# Patient Record
Sex: Female | Born: 1935 | Race: White | Hispanic: No | Smoking: Former smoker
Health system: Southern US, Community
[De-identification: ages and names within clinical notes are randomized; demographics above are authoritative.]

## PROBLEM LIST (undated history)

## (undated) DIAGNOSIS — I472 Ventricular tachycardia, unspecified: Secondary | ICD-10-CM

## (undated) DIAGNOSIS — I447 Left bundle-branch block, unspecified: Secondary | ICD-10-CM

## (undated) DIAGNOSIS — Z90710 Acquired absence of both cervix and uterus: Secondary | ICD-10-CM

## (undated) DIAGNOSIS — R0902 Hypoxemia: Secondary | ICD-10-CM

## (undated) DIAGNOSIS — Z8542 Personal history of malignant neoplasm of other parts of uterus: Secondary | ICD-10-CM

## (undated) DIAGNOSIS — IMO0001 Reserved for inherently not codable concepts without codable children: Secondary | ICD-10-CM

## (undated) DIAGNOSIS — I219 Acute myocardial infarction, unspecified: Secondary | ICD-10-CM

## (undated) DIAGNOSIS — M1990 Unspecified osteoarthritis, unspecified site: Secondary | ICD-10-CM

## (undated) DIAGNOSIS — I10 Essential (primary) hypertension: Secondary | ICD-10-CM

## (undated) DIAGNOSIS — I2510 Atherosclerotic heart disease of native coronary artery without angina pectoris: Secondary | ICD-10-CM

## (undated) DIAGNOSIS — R011 Cardiac murmur, unspecified: Secondary | ICD-10-CM

## (undated) DIAGNOSIS — Z95810 Presence of automatic (implantable) cardiac defibrillator: Secondary | ICD-10-CM

## (undated) DIAGNOSIS — E785 Hyperlipidemia, unspecified: Secondary | ICD-10-CM

## (undated) DIAGNOSIS — I509 Heart failure, unspecified: Secondary | ICD-10-CM

## (undated) DIAGNOSIS — I4891 Unspecified atrial fibrillation: Secondary | ICD-10-CM

## (undated) HISTORY — PX: CORONARY ANGIOPLASTY: SHX604

## (undated) HISTORY — PX: INSERT / REPLACE / REMOVE PACEMAKER: SUR710

## (undated) HISTORY — DX: Ventricular tachycardia: I47.2

## (undated) HISTORY — DX: Left bundle-branch block, unspecified: I44.7

## (undated) HISTORY — PX: CATARACT EXTRACTION: SUR2

## (undated) HISTORY — DX: Hyperlipidemia, unspecified: E78.5

## (undated) HISTORY — DX: Personal history of malignant neoplasm of other parts of uterus: Z85.42

## (undated) HISTORY — PX: CHOLECYSTECTOMY: SHX55

## (undated) HISTORY — DX: Hypoxemia: R09.02

## (undated) HISTORY — DX: Unspecified osteoarthritis, unspecified site: M19.90

## (undated) HISTORY — DX: Presence of automatic (implantable) cardiac defibrillator: Z95.810

## (undated) HISTORY — DX: Acquired absence of both cervix and uterus: Z90.710

## (undated) HISTORY — PX: CARDIAC DEFIBRILLATOR PLACEMENT: SHX171

## (undated) HISTORY — DX: Ventricular tachycardia, unspecified: I47.20

## (undated) HISTORY — PX: HERNIA REPAIR: SHX51

## (undated) HISTORY — PX: CORONARY ANGIOPLASTY WITH STENT PLACEMENT: SHX49

## (undated) HISTORY — DX: Unspecified atrial fibrillation: I48.91

## (undated) HISTORY — DX: Acute myocardial infarction, unspecified: I21.9

---

## 1987-06-01 HISTORY — PX: ABDOMINAL HYSTERECTOMY: SHX81

## 1993-05-31 HISTORY — PX: CORONARY ARTERY BYPASS GRAFT: SHX141

## 1997-10-29 ENCOUNTER — Other Ambulatory Visit: Admission: RE | Admit: 1997-10-29 | Discharge: 1997-10-29 | Payer: Self-pay | Admitting: Obstetrics & Gynecology

## 1998-11-12 ENCOUNTER — Other Ambulatory Visit: Admission: RE | Admit: 1998-11-12 | Discharge: 1998-11-12 | Payer: Self-pay | Admitting: Obstetrics & Gynecology

## 1999-11-18 ENCOUNTER — Other Ambulatory Visit: Admission: RE | Admit: 1999-11-18 | Discharge: 1999-11-18 | Payer: Self-pay | Admitting: Obstetrics & Gynecology

## 2000-10-20 ENCOUNTER — Ambulatory Visit (HOSPITAL_COMMUNITY): Admission: RE | Admit: 2000-10-20 | Discharge: 2000-10-20 | Payer: Self-pay | Admitting: Ophthalmology

## 2000-12-29 ENCOUNTER — Encounter: Payer: Self-pay | Admitting: Family Medicine

## 2000-12-29 LAB — CONVERTED CEMR LAB

## 2001-02-01 ENCOUNTER — Other Ambulatory Visit: Admission: RE | Admit: 2001-02-01 | Discharge: 2001-02-01 | Payer: Self-pay | Admitting: Obstetrics & Gynecology

## 2003-04-01 ENCOUNTER — Other Ambulatory Visit: Admission: RE | Admit: 2003-04-01 | Discharge: 2003-04-01 | Payer: Self-pay | Admitting: Obstetrics & Gynecology

## 2003-09-05 ENCOUNTER — Ambulatory Visit (HOSPITAL_COMMUNITY): Admission: RE | Admit: 2003-09-05 | Discharge: 2003-09-05 | Payer: Self-pay | Admitting: Cardiology

## 2003-09-12 ENCOUNTER — Ambulatory Visit (HOSPITAL_COMMUNITY): Admission: RE | Admit: 2003-09-12 | Discharge: 2003-09-13 | Payer: Self-pay | Admitting: Cardiology

## 2003-10-11 ENCOUNTER — Observation Stay (HOSPITAL_COMMUNITY): Admission: EM | Admit: 2003-10-11 | Discharge: 2003-10-11 | Payer: Self-pay | Admitting: Emergency Medicine

## 2004-11-02 ENCOUNTER — Ambulatory Visit: Payer: Self-pay | Admitting: Family Medicine

## 2004-12-17 ENCOUNTER — Ambulatory Visit: Payer: Self-pay | Admitting: Family Medicine

## 2005-03-16 ENCOUNTER — Ambulatory Visit: Payer: Self-pay | Admitting: Family Medicine

## 2005-04-12 ENCOUNTER — Other Ambulatory Visit: Admission: RE | Admit: 2005-04-12 | Discharge: 2005-04-12 | Payer: Self-pay | Admitting: Obstetrics & Gynecology

## 2005-08-25 ENCOUNTER — Encounter: Payer: Self-pay | Admitting: Internal Medicine

## 2006-01-12 ENCOUNTER — Ambulatory Visit: Payer: Self-pay | Admitting: Family Medicine

## 2006-01-17 ENCOUNTER — Ambulatory Visit: Payer: Self-pay | Admitting: Family Medicine

## 2006-03-09 ENCOUNTER — Ambulatory Visit: Payer: Self-pay | Admitting: Family Medicine

## 2006-05-31 HISTORY — PX: DOPPLER ECHOCARDIOGRAPHY: SHX263

## 2006-08-25 ENCOUNTER — Inpatient Hospital Stay (HOSPITAL_COMMUNITY): Admission: EM | Admit: 2006-08-25 | Discharge: 2006-08-30 | Payer: Self-pay | Admitting: Emergency Medicine

## 2006-08-25 ENCOUNTER — Ambulatory Visit: Payer: Self-pay | Admitting: Internal Medicine

## 2006-08-30 ENCOUNTER — Encounter: Payer: Self-pay | Admitting: Internal Medicine

## 2006-09-15 ENCOUNTER — Ambulatory Visit: Payer: Self-pay

## 2006-09-15 ENCOUNTER — Encounter: Payer: Self-pay | Admitting: Cardiology

## 2006-12-13 ENCOUNTER — Ambulatory Visit: Payer: Self-pay | Admitting: Internal Medicine

## 2006-12-13 ENCOUNTER — Ambulatory Visit: Payer: Self-pay

## 2007-01-03 ENCOUNTER — Ambulatory Visit: Payer: Self-pay | Admitting: Family Medicine

## 2007-01-03 DIAGNOSIS — I252 Old myocardial infarction: Secondary | ICD-10-CM | POA: Insufficient documentation

## 2007-01-03 DIAGNOSIS — M1990 Unspecified osteoarthritis, unspecified site: Secondary | ICD-10-CM | POA: Insufficient documentation

## 2007-01-03 DIAGNOSIS — H409 Unspecified glaucoma: Secondary | ICD-10-CM | POA: Insufficient documentation

## 2007-01-03 DIAGNOSIS — E785 Hyperlipidemia, unspecified: Secondary | ICD-10-CM | POA: Insufficient documentation

## 2007-01-03 DIAGNOSIS — Z8542 Personal history of malignant neoplasm of other parts of uterus: Secondary | ICD-10-CM | POA: Insufficient documentation

## 2007-01-03 DIAGNOSIS — I2589 Other forms of chronic ischemic heart disease: Secondary | ICD-10-CM | POA: Insufficient documentation

## 2007-01-03 DIAGNOSIS — I4891 Unspecified atrial fibrillation: Secondary | ICD-10-CM | POA: Insufficient documentation

## 2007-01-16 ENCOUNTER — Encounter: Payer: Self-pay | Admitting: Family Medicine

## 2007-01-20 ENCOUNTER — Telehealth: Payer: Self-pay | Admitting: Family Medicine

## 2007-03-02 ENCOUNTER — Ambulatory Visit: Payer: Self-pay | Admitting: Family Medicine

## 2007-03-23 ENCOUNTER — Telehealth: Payer: Self-pay | Admitting: Family Medicine

## 2007-05-11 ENCOUNTER — Encounter: Payer: Self-pay | Admitting: Internal Medicine

## 2007-09-21 ENCOUNTER — Encounter: Payer: Self-pay | Admitting: Family Medicine

## 2008-02-09 ENCOUNTER — Ambulatory Visit: Payer: Self-pay | Admitting: Internal Medicine

## 2008-02-09 ENCOUNTER — Inpatient Hospital Stay (HOSPITAL_COMMUNITY): Admission: EM | Admit: 2008-02-09 | Discharge: 2008-02-18 | Payer: Self-pay | Admitting: Emergency Medicine

## 2008-02-15 ENCOUNTER — Encounter: Payer: Self-pay | Admitting: Internal Medicine

## 2008-02-16 ENCOUNTER — Encounter (INDEPENDENT_AMBULATORY_CARE_PROVIDER_SITE_OTHER): Payer: Self-pay | Admitting: Internal Medicine

## 2008-02-16 ENCOUNTER — Ambulatory Visit: Payer: Self-pay | Admitting: *Deleted

## 2008-02-29 ENCOUNTER — Encounter: Payer: Self-pay | Admitting: Family Medicine

## 2008-03-11 ENCOUNTER — Ambulatory Visit: Payer: Self-pay | Admitting: Internal Medicine

## 2008-03-11 LAB — CONVERTED CEMR LAB: INR: 14.5 (ref 0.8–1.0)

## 2008-03-29 ENCOUNTER — Ambulatory Visit: Payer: Self-pay | Admitting: Family Medicine

## 2008-06-05 DIAGNOSIS — I447 Left bundle-branch block, unspecified: Secondary | ICD-10-CM | POA: Insufficient documentation

## 2008-06-05 DIAGNOSIS — I4901 Ventricular fibrillation: Secondary | ICD-10-CM | POA: Insufficient documentation

## 2008-06-11 ENCOUNTER — Ambulatory Visit: Payer: Self-pay | Admitting: Internal Medicine

## 2008-06-11 LAB — CONVERTED CEMR LAB
BUN: 21 mg/dL (ref 6–23)
Chloride: 100 meq/L (ref 96–112)
Creatinine, Ser: 1.2 mg/dL (ref 0.4–1.2)
Eosinophils Relative: 1.4 % (ref 0.0–5.0)
GFR calc Af Amer: 55 mL/min
Glucose, Bld: 99 mg/dL (ref 70–99)
HCT: 42 % (ref 36.0–46.0)
MCHC: 33.5 g/dL (ref 30.0–36.0)
MCV: 88.2 fL (ref 78.0–100.0)
Monocytes Absolute: 0.7 10*3/uL (ref 0.1–1.0)
Neutro Abs: 4.6 10*3/uL (ref 1.4–7.7)
Platelets: 145 10*3/uL — ABNORMAL LOW (ref 150–400)
WBC: 6.7 10*3/uL (ref 4.5–10.5)

## 2008-06-17 ENCOUNTER — Ambulatory Visit: Payer: Self-pay | Admitting: Cardiology

## 2008-06-17 ENCOUNTER — Ambulatory Visit (HOSPITAL_COMMUNITY): Admission: RE | Admit: 2008-06-17 | Discharge: 2008-06-17 | Payer: Self-pay | Admitting: Cardiology

## 2008-07-17 ENCOUNTER — Ambulatory Visit: Payer: Self-pay | Admitting: Internal Medicine

## 2008-09-08 ENCOUNTER — Emergency Department (HOSPITAL_COMMUNITY): Admission: EM | Admit: 2008-09-08 | Discharge: 2008-09-08 | Payer: Self-pay | Admitting: Emergency Medicine

## 2008-10-22 ENCOUNTER — Encounter (INDEPENDENT_AMBULATORY_CARE_PROVIDER_SITE_OTHER): Payer: Self-pay | Admitting: *Deleted

## 2008-11-06 ENCOUNTER — Encounter: Payer: Self-pay | Admitting: Internal Medicine

## 2008-11-06 ENCOUNTER — Ambulatory Visit: Payer: Self-pay

## 2008-11-07 ENCOUNTER — Encounter: Payer: Self-pay | Admitting: Internal Medicine

## 2009-02-11 ENCOUNTER — Ambulatory Visit: Payer: Self-pay | Admitting: Internal Medicine

## 2009-02-14 LAB — CONVERTED CEMR LAB: Free T4: 0.6 ng/dL (ref 0.6–1.6)

## 2009-02-26 ENCOUNTER — Ambulatory Visit: Payer: Self-pay | Admitting: Family Medicine

## 2009-05-01 ENCOUNTER — Ambulatory Visit: Payer: Self-pay

## 2009-05-01 ENCOUNTER — Encounter: Payer: Self-pay | Admitting: Internal Medicine

## 2009-05-09 ENCOUNTER — Telehealth (INDEPENDENT_AMBULATORY_CARE_PROVIDER_SITE_OTHER): Payer: Self-pay | Admitting: *Deleted

## 2009-05-09 LAB — CONVERTED CEMR LAB: Free T4: 1.2 ng/dL (ref 0.6–1.6)

## 2009-07-15 ENCOUNTER — Telehealth (INDEPENDENT_AMBULATORY_CARE_PROVIDER_SITE_OTHER): Payer: Self-pay | Admitting: *Deleted

## 2009-08-12 ENCOUNTER — Encounter: Payer: Self-pay | Admitting: Internal Medicine

## 2009-08-22 ENCOUNTER — Encounter: Payer: Self-pay | Admitting: Internal Medicine

## 2009-08-22 ENCOUNTER — Encounter: Admission: RE | Admit: 2009-08-22 | Discharge: 2009-08-22 | Payer: Self-pay | Admitting: Orthopaedic Surgery

## 2009-08-26 ENCOUNTER — Encounter: Payer: Self-pay | Admitting: Family Medicine

## 2009-09-09 ENCOUNTER — Ambulatory Visit: Payer: Self-pay | Admitting: Internal Medicine

## 2009-09-09 DIAGNOSIS — I714 Abdominal aortic aneurysm, without rupture, unspecified: Secondary | ICD-10-CM | POA: Insufficient documentation

## 2009-11-27 ENCOUNTER — Telehealth (INDEPENDENT_AMBULATORY_CARE_PROVIDER_SITE_OTHER): Payer: Self-pay | Admitting: *Deleted

## 2009-12-15 ENCOUNTER — Encounter: Payer: Self-pay | Admitting: Internal Medicine

## 2009-12-15 ENCOUNTER — Ambulatory Visit: Payer: Self-pay

## 2010-02-25 ENCOUNTER — Ambulatory Visit: Payer: Self-pay | Admitting: Family Medicine

## 2010-03-10 ENCOUNTER — Telehealth: Payer: Self-pay | Admitting: Internal Medicine

## 2010-03-11 ENCOUNTER — Ambulatory Visit: Payer: Self-pay | Admitting: Internal Medicine

## 2010-03-11 LAB — CONVERTED CEMR LAB
Calcium: 9 mg/dL (ref 8.4–10.5)
Creatinine, Ser: 1.3 mg/dL — ABNORMAL HIGH (ref 0.4–1.2)
GFR calc non Af Amer: 40.93 mL/min (ref >60)
Potassium: 4.2 meq/L (ref 3.5–5.1)
Sodium: 140 meq/L (ref 135–145)

## 2010-03-12 ENCOUNTER — Encounter: Payer: Self-pay | Admitting: Internal Medicine

## 2010-03-12 ENCOUNTER — Ambulatory Visit: Payer: Self-pay | Admitting: Internal Medicine

## 2010-03-23 ENCOUNTER — Ambulatory Visit: Payer: Self-pay | Admitting: Family Medicine

## 2010-03-27 ENCOUNTER — Telehealth: Payer: Self-pay | Admitting: Family Medicine

## 2010-03-27 ENCOUNTER — Telehealth: Payer: Self-pay | Admitting: Internal Medicine

## 2010-06-17 ENCOUNTER — Ambulatory Visit: Admission: RE | Admit: 2010-06-17 | Discharge: 2010-06-17 | Payer: Self-pay | Source: Home / Self Care

## 2010-06-17 ENCOUNTER — Encounter: Payer: Self-pay | Admitting: Internal Medicine

## 2010-06-29 ENCOUNTER — Telehealth: Payer: Self-pay | Admitting: Internal Medicine

## 2010-06-30 ENCOUNTER — Telehealth: Payer: Self-pay | Admitting: Internal Medicine

## 2010-06-30 NOTE — Progress Notes (Signed)
Summary: requests tussionex  Phone Note Call from Patient Call back at Home Phone [phone]   Caller: Patient Summary of Call: Pt was recently seen and given tesselon for a cough, this is not helping and the pt requests that tussionex be called to [facility].   She says she knows that works. Initial call taken by: Lowella Petties CMA, AAMA,  March 27, 2010 11:00 AM  Follow-up for Phone Call        ok to send in.  please call in tussionex pennkinetic, one teaspoon two times a day as needed cough.  #100cc, no refills.  please input in chart, unable to currently. Follow-up by: Eustaquio Boyden  MD,  March 27, 2010 11:12 AM  Additional Follow-up for Phone Call Additional follow up Details #1::        Rx called in as directed. Patient notified. Will attempt to add to chart Additional Follow-up by: Janee Morn CMA Duncan Dull),  March 27, 2010 11:23 AM

## 2010-06-30 NOTE — Progress Notes (Signed)
  Records Recieved from [facility] gave to [facility] [place]  November 27, 2009 3:06 PM

## 2010-06-30 NOTE — Assessment & Plan Note (Signed)
Summary: FLU SHOT/CLE  Nurse Visit   Allergies: No Known Drug Allergies  Orders Added: 1)  Flu Vaccine 76yrs + MEDICARE PATIENTS [Q2039] 2)  Administration Flu vaccine - MCR [G0008]    Flu Vaccine Consent Questions     Do you have a history of severe allergic reactions to this vaccine? no    Any prior history of allergic reactions to egg and/or gelatin? no    Do you have a sensitivity to the preservative Thimersol? no    Do you have a past history of Guillan-Barre Syndrome? no    Do you currently have an acute febrile illness? no    Have you ever had a severe reaction to latex? no    Vaccine information given and explained to patient? yes    Are you currently pregnant? no    Lot Number:[id number]   Exp Date:11/28/2010   Manufacturer: [employer]    Site Given  Left Deltoid IMu]

## 2010-06-30 NOTE — Assessment & Plan Note (Signed)
Summary: pc2   Visit Type:  Follow-up Primary Provider:  Judith Part MD   History of Present Illness: Ms. Mastrianni returns today for followup.  She has a h/o longstanding CAD, s/p MI with an EF25%, Class 2-3 CHF, LBBB, s/p BiV ICD.   She developed atrial fibrillation several months ago and was placed on coumadin and amiodarone and has been maintained very nicely in NSR.  The patient c/o weight gain and hair loss and was found to be hypothyroid and is now on synthroid.  No c/p or sob.    Current Medications (verified): 1)  Aspirin 81 Mg Tabs (Aspirin) .... One By Mouth Daily 2)  Coreg 6.25 Mg  Tabs (Carvedilol) .... Take One By Mouth Bid 3)  Lisinopril-Hydrochlorothiazide 10-12.5 Mg  Tabs (Lisinopril-Hydrochlorothiazide) .... Take One By Mouth Daily 4)  Crestor 10 Mg  Tabs (Rosuvastatin Calcium) .... Take One By Mouth Daily 5)  Cordarone 200 Mg Tabs (Amiodarone Hcl) .... One Half By Mouth Daily 6)  Plavix 75 Mg Tabs (Clopidogrel Bisulfate) .... Take One Tablet By Mouth Daily 7)  Synthroid 50 Mcg Tabs (Levothyroxine Sodium) .... One By Mouth Once Daily  Allergies (verified): No Known Drug Allergies  Past History:  Past Medical History: Last updated: 06/05/2008 Atrial fibrillation-S/P ATRIAL FIB ABLATION 01/2008 Hyperlipidemia Myocardial infarction, hx of Osteoarthritis Current Problems:  ICD - IN SITU (ICD-V45.02) PACEMAKER (ICD-V45.Marland Kitchen01) LBBB (ICD-426.3) VENTRICULAR FIBRILLATION (ICD-427.41) CARDIOMYOPATHY, ISCHEMIC (ICD-414.8) Hx of GLAUCOMA (ICD-365.9) Hx of UTERINE CANCER, HX OF (ICD-V10.42) OSTEOARTHRITIS (ICD-715.90) MYOCARDIAL INFARCTION, HX OF (ICD-412) HYPERLIPIDEMIA (ICD-272.4) ATRIAL FIBRILLATION (ICD-427.31)  Past Surgical History: Last updated: 06/05/2008 Coronary artery bypass graft (1995) Cataract extraction Cholecystectomy Hysterectomy- total (1610) PTCA/stent (1998) Hernia repair Echo (07/2001) ? Dexa- ok per pt (2002) Hosp- CAD, VT, implant  defib (07/2006) RESULTS:  This demonstrates successful implantation of a St. Jude bi-V  ICD in a patient with an ischemic cardiomyopathy, congestive heart  failure, left bundle branch, and ventricular tachycardia (symptomatic).  Review of Systems  The patient denies chest pain, syncope, dyspnea on exertion, and peripheral edema.    Vital Signs:  Patient profile:   77 year old female Height:      63 inches Weight:      222 pounds Pulse rate:   83 / minute BP sitting:   124 / 68  (left arm)  Vitals Entered By: Laurance Flatten CMA (September 09, 2009 3:20 PM)  Physical Exam  General:  Obese, elderly woman NAD. Head:  normocephalic and atraumatic Eyes:  PERRLA/EOM intact; conjunctiva and lids normal. Mouth:  Teeth, gums and palate normal. Oral mucosa normal. Neck:  No goiter.  No JVD.  No Thyromegally. Chest Wall:  Well healed ICD incision. Lungs:  Clear bilaterally to auscultation. Heart:  RRR with normal S1 and S2.  PMI is enlarged and laterally displaced. Abdomen:  Obese NT,ND no HSM. Msk:  Back normal, normal gait. Muscle strength and tone normal. Pulses:  pulses normal in all 4 extremities Extremities:  No clubbing or cyanosis. Neurologic:  Alert and oriented x 3.    ICD Specifications Following MD:  Lewayne Bunting, MD     ICD Vendor:  [facility] Jude     ICD Model Number:  [id number]     ICD Serial Number:  [id number] ICD DOI:  08/29/2006     ICD Implanting MD:  Lewayne Bunting, MD  Lead 1:    Location: RA     DOI: 08/29/2006     Model #: [id number]  Serial #: [id number]     Status: active Lead 2:    Location: RV     DOI: 08/29/2006     Model #: [id number]     Serial #: [id number]     Status: active Lead 3:    Location: LV     DOI: 08/29/2006     Model #: 1158T     Serial #: [id number]     Status: active  Indications::  ICM, CHF   ICD Follow Up Remote Check?  No Battery Voltage:  2.57 V     Charge Time:  13.3 seconds     Battery Est. Longevity:  2.2 years Underlying rhythm:  dependent ICD Dependent:   Yes       ICD Device Measurements Atrium:  Amplitude: 1.8 mV, Impedance: 400 ohms, Threshold: 0.75 V at 0.5 msec Right Ventricle:  Amplitude: 12 mV, Impedance: 500 ohms, Threshold: 0.75 V at 0.5 msec Left Ventricle:  Impedance: 840 ohms, Threshold: 2.75 V at 1.5 msec Configuration: LV TIP TO RV COIL Shock Impedance: 49 ohms   Episodes MS Episodes:  257     Percent Mode Switch:  <1%     Coumadin:  No Shock:  0     ATP:  0     Nonsustained:  0     Atrial Pacing:  90%     Ventricular Pacing:  92%  Brady Parameters Mode DDDR     Lower Rate Limit:  70     Upper Rate Limit 110 PAV 160     Sensed AV Delay:  130  Tachy Zones VF:  240     VT:  200     VT1:  150     Next Cardiology Appt Due:  11/28/2009 Tech Comments:  Mode switch episodes are atrial noise.  Unable to reproduce today.  No parameter changes.  ROV 3 months clinc.  Checked by industry. Altha Harm, LPN  September 09, 2009 3:37 PM  MD Comments:  Agree with above.  Impression & Recommendations:  Problem # 1:  ICD - IN SITU (ICD-V45.02) Her device is working normally today with the exception of minimal noise on her atrial lead.  I will followup in several months.  Problem # 2:  VENTRICULAR FIBRILLATION (ICD-427.41) She has had no ICD therapies since her device was placed. Her updated medication list for this problem includes:    Aspirin 81 Mg Tabs (Aspirin) ..... One by mouth daily    Coreg 6.25 Mg Tabs (Carvedilol) .Marland Kitchen... Take one by mouth bid    Lisinopril-hydrochlorothiazide 10-12.5 Mg Tabs (Lisinopril-hydrochlorothiazide) .Marland Kitchen... Take one by mouth daily    Cordarone 200 Mg Tabs (Amiodarone hcl) ..... One half by mouth daily    Plavix 75 Mg Tabs (Clopidogrel bisulfate) .Marland Kitchen... Take one tablet by mouth daily  Problem # 3:  CARDIOMYOPATHY, ISCHEMIC (ICD-414.8) She denies anginal symptoms.  Continue meds as noted below. Her updated medication list for this problem includes:    Aspirin 81 Mg Tabs (Aspirin) ..... One by mouth daily     Coreg 6.25 Mg Tabs (Carvedilol) .Marland Kitchen... Take one by mouth bid    Lisinopril-hydrochlorothiazide 10-12.5 Mg Tabs (Lisinopril-hydrochlorothiazide) .Marland Kitchen... Take one by mouth daily    Cordarone 200 Mg Tabs (Amiodarone hcl) ..... One half by mouth daily    Plavix 75 Mg Tabs (Clopidogrel bisulfate) .Marland Kitchen... Take one tablet by mouth daily  Problem # 4:  ATRIAL FIBRILLATION (ICD-427.31) She has nicely maintained NSR since starting amiodarone.  Her thyroid function will  be rechecked when I see her back in several months. Her updated medication list for this problem includes:    Aspirin 81 Mg Tabs (Aspirin) ..... One by mouth daily    Coreg 6.25 Mg Tabs (Carvedilol) .Marland Kitchen... Take one by mouth bid    Cordarone 200 Mg Tabs (Amiodarone hcl) ..... One half by mouth daily    Plavix 75 Mg Tabs (Clopidogrel bisulfate) .Marland Kitchen... Take one tablet by mouth daily  Other Orders: CT Scan  (CT Scan)  Patient Instructions: 1)  Your physician recommends that you schedule a follow-up appointment in: 3 months with device clinic and 6 months with Dr Ladona Ridgel 2)  CT- of the abdomen in 6 months to follow up on AAA Prescriptions: SYNTHROID 50 MCG TABS (LEVOTHYROXINE SODIUM) one by mouth once daily  #90 x 3   Entered by:   Laurance Flatten CMA   Authorized by:   Laren Boom, MD, [facility]   Signed by:   Laurance Flatten CMA on 09/09/2009   Method used:   Faxed to ...       [facility] (mail-order)             , [place]         Ph:        Fax: [phone]   RxID:   [id number]

## 2010-06-30 NOTE — Progress Notes (Signed)
Summary: question re ct scan  Phone Note Call from Patient   Caller: Patient Reason for Call: Talk to Nurse Summary of Call: pt states she got a letter from dr taylor's nurse re a ct scan, she didn't know anything about getting one and she has an appt with dr taylor on 10-13 Initial call taken by: Glynda Jaeger,  March 10, 2010 1:28 PM  Follow-up for Phone Call        She is going to have CT sameday. She did not go to get labs drawn yet.  will have pt go to [facility] for [facility] tomorrow 03/11/10.  Will call pt back with time Dennis Bast, RN, BSN  March 10, 2010 2:11 PM

## 2010-06-30 NOTE — Cardiovascular Report (Signed)
Summary: Office Visit   Office Visit   Imported By: Roderic Ovens 03/19/2010 13:37:18  _____________________________________________________________________  External Attachment:    Type:   Image     Comment:   External Document

## 2010-06-30 NOTE — Cardiovascular Report (Signed)
Summary: [facility] Cath Report   [facility] Cath Report   Imported By: Roderic Ovens 01/06/2010 14:59:59  _____________________________________________________________________  External Attachment:    Type:   Image     Comment:   External Document

## 2010-06-30 NOTE — Procedures (Signed)
Summary: Cardiology Device Clinic   Current Medications (verified): 1)  Aspirin 81 Mg Tabs (Aspirin) .... One By Mouth Daily 2)  Coreg 6.25 Mg  Tabs (Carvedilol) .... Take One By Mouth Bid 3)  Lisinopril-Hydrochlorothiazide 10-12.5 Mg  Tabs (Lisinopril-Hydrochlorothiazide) .... Take One By Mouth Daily 4)  Crestor 10 Mg  Tabs (Rosuvastatin Calcium) .... Take One By Mouth Daily 5)  Cordarone 200 Mg Tabs (Amiodarone Hcl) .... One Half By Mouth Daily 6)  Plavix 75 Mg Tabs (Clopidogrel Bisulfate) .... Take One Tablet By Mouth Daily 7)  Synthroid 50 Mcg Tabs (Levothyroxine Sodium) .... One By Mouth Once Daily 8)  Multivitamins   Tabs (Multiple Vitamin) .... Once Daily  Allergies (verified): No Known Drug Allergies   ICD Specifications Following MD:  Lewayne Bunting, MD     ICD Vendor:  [employer] Jude     ICD Model Number:  [phone]     ICD Serial Number:  [id number] ICD DOI:  08/29/2006     ICD Implanting MD:  Lewayne Bunting, MD  Lead 1:    Location: RA     DOI: 08/29/2006     Model #: [id number]     Serial #: [id number]     Status: active Lead 2:    Location: RV     DOI: 08/29/2006     Model #: 2956     Serial #: [id number]     Status: active Lead 3:    Location: LV     DOI: 08/29/2006     Model #: 1158T     Serial #: [id number]     Status: active  Indications::  ICM, CHF   ICD Follow Up Battery Voltage:  2.56 V     Charge Time:  14.6 seconds     Battery Est. Longevity:  1.1 yrs Underlying rhythm:  SR ICD Dependent:  Yes       ICD Device Measurements Atrium:  Amplitude: 1.1 mV, Impedance: 400 ohms, Threshold: 0.75 V at 0.5 msec Right Ventricle:  Amplitude: 12.0 mV, Impedance: 480 ohms, Threshold: 0.75 V at 0.5 msec Left Ventricle:  Impedance: 800 ohms, Threshold: 2.75 V at 1.5 msec Configuration: LV TIP TO RV COIL Shock Impedance: 48 ohms   Episodes MS Episodes:  155     Percent Mode Switch:  <1%     Coumadin:  No Shock:  0     ATP:  0     Nonsustained:  0     Atrial Therapies:  0 Atrial Pacing:  86%      Ventricular Pacing:  86%  Brady Parameters Mode DDDR     Lower Rate Limit:  70     Upper Rate Limit 110 PAV 160     Sensed AV Delay:  130  Tachy Zones VF:  240     VT:  200     VT1:  150     Next Cardiology Appt Due:  06/01/2010 Tech Comments:  155 AMS EPISODES--MOST WERE NOISE ON ATRIAL LEAD.  NO EPISODES SINCE LAST CHECK.  NORMAL DEVICE FUNCTION.  NO CHANGES MADE. ROV IN 3 MTHS W/DEVICE CLINIC. Vella Kohler  March 12, 2010 12:46 PM

## 2010-06-30 NOTE — Letter (Signed)
Summary: [facility], Myocar 2003 - 2007  [facility], Myocar 2003 - 2007   Imported By: Roderic Ovens 01/06/2010 15:02:38  _____________________________________________________________________  External Attachment:    Type:   Image     Comment:   External Document

## 2010-06-30 NOTE — Progress Notes (Signed)
Summary: pain left side chest  Phone Note Call from Patient   Caller: Patient [phone] Reason for Call: Talk to Nurse Summary of Call: pt calling re pains in chest on left side Initial call taken by: Glynda Jaeger,  March 27, 2010 10:10 AM  Follow-up for Phone Call        Mrs. Wombles calls today b/c she had a "pain" in the left chest this morning that went away.  She was up a lot last night coughing.  She recently was placed on Tessalon pearls & an antibiotic.  She said the Kimberlee Nearing was not working.  She will call Dr. Milinda Antis office today.  She says Tussionex liquid works better.  If her chest pain returns she will call 911. Mylo Red RN

## 2010-06-30 NOTE — Assessment & Plan Note (Signed)
Summary: pc2 pt need cta of abdomen the sameday/sl   Visit Type:  Follow-up Primary Provider:  Judith Part MD   History of Present Illness: [name] returns today for followup.  She has a h/o longstanding CAD, s/p MI with an EF25%, Class 2-3 CHF, LBBB, s/p BiV ICD.   She developed atrial fibrillation several months ago and was placed on coumadin and amiodarone and has been maintained very nicely in NSR.  The patient was found to have a AAA and has undergone repeat CT scan, the results of which are pending.   No c/p or sob. She has not lost any weight because she states that she likes to eat too much.  No other complaints today.    Current Medications (verified): 1)  Aspirin 81 Mg Tabs (Aspirin) .... One By Mouth Daily 2)  Coreg 6.25 Mg  Tabs (Carvedilol) .... Take One By Mouth Bid 3)  Lisinopril-Hydrochlorothiazide 10-12.5 Mg  Tabs (Lisinopril-Hydrochlorothiazide) .... Take One By Mouth Daily 4)  Crestor 10 Mg  Tabs (Rosuvastatin Calcium) .... Take One By Mouth Daily 5)  Cordarone 200 Mg Tabs (Amiodarone Hcl) .... One Half By Mouth Daily 6)  Plavix 75 Mg Tabs (Clopidogrel Bisulfate) .... Take One Tablet By Mouth Daily 7)  Synthroid 50 Mcg Tabs (Levothyroxine Sodium) .... One By Mouth Once Daily 8)  Multivitamins   Tabs (Multiple Vitamin) .... Once Daily  Allergies (verified): No Known Drug Allergies  Past History:  Past Medical History: Last updated: 06/05/2008 Atrial fibrillation-S/P ATRIAL FIB ABLATION 01/2008 Hyperlipidemia Myocardial infarction, hx of Osteoarthritis Current Problems:  ICD - IN SITU (ICD-V45.02) PACEMAKER (ICD-V45.Marland Kitchen01) LBBB (ICD-426.3) VENTRICULAR FIBRILLATION (ICD-427.41) CARDIOMYOPATHY, ISCHEMIC (ICD-414.8) Hx of GLAUCOMA (ICD-365.9) Hx of UTERINE CANCER, HX OF (ICD-V10.42) OSTEOARTHRITIS (ICD-715.90) MYOCARDIAL INFARCTION, HX OF (ICD-412) HYPERLIPIDEMIA (ICD-272.4) ATRIAL FIBRILLATION (ICD-427.31)  Past Surgical History: Last updated:  06/05/2008 Coronary artery bypass graft (1995) Cataract extraction Cholecystectomy Hysterectomy- total (0102) PTCA/stent (1998) Hernia repair Echo (07/2001) ? Dexa- ok per pt (2002) Hosp- CAD, VT, implant defib (07/2006) RESULTS:  This demonstrates successful implantation of a St. Jude bi-V  ICD in a patient with an ischemic cardiomyopathy, congestive heart  failure, left bundle branch, and ventricular tachycardia (symptomatic).  Review of Systems  The patient denies chest pain, syncope, dyspnea on exertion, and peripheral edema.    Vital Signs:  Patient profile:   77 year old female Height:      63 inches Weight:      211 pounds BMI:     37.51 Pulse rate:   68 / minute BP sitting:   110 / 74  (left arm)  Vitals Entered By: Laurance Flatten CMA (March 12, 2010 10:46 AM)  Physical Exam  General:  Obese, elderly woman NAD. Head:  normocephalic and atraumatic Eyes:  PERRLA/EOM intact; conjunctiva and lids normal. Mouth:  Teeth, gums and palate normal. Oral mucosa normal. Neck:  No goiter.  No JVD.  No Thyromegally. Chest Wall:  Well healed ICD incision. Lungs:  Clear bilaterally to auscultation. Heart:  RRR with normal S1 and S2.  PMI is enlarged and laterally displaced. Abdomen:  Obese NT,ND no HSM. Msk:  Back normal, normal gait. Muscle strength and tone normal. Pulses:  pulses normal in all 4 extremities Extremities:  No clubbing or cyanosis. Neurologic:  Alert and oriented x 3.    ICD Specifications Following MD:  Lewayne Bunting, MD     ICD Vendor:  [facility] Jude     ICD Model Number:  [phone]  ICD Serial Number:  [id number] ICD DOI:  08/29/2006     ICD Implanting MD:  Lewayne Bunting, MD  Lead 1:    Location: RA     DOI: 08/29/2006     Model #: 1788TC     Serial #: [id number]     Status: active Lead 2:    Location: RV     DOI: 08/29/2006     Model #: 1914     Serial #: [id number]     Status: active Lead 3:    Location: LV     DOI: 08/29/2006     Model #: 1158T     Serial #:  [id number]     Status: active  Indications::  ICM, CHF   ICD Follow Up ICD Dependent:  Yes       ICD Device Measurements Configuration: LV TIP TO RV COIL  Episodes Coumadin:  No  Brady Parameters Mode DDDR     Lower Rate Limit:  70     Upper Rate Limit 110 PAV 160     Sensed AV Delay:  130  Tachy Zones VF:  240     VT:  200     VT1:  150     MD Comments:  Normal device function.  ERI in about 1 year.  Impression & Recommendations:  Problem # 1:  ICD - IN SITU (ICD-V45.02) her device is working normally.  Will recheck in several months.  Problem # 2:  CARDIOMYOPATHY, ISCHEMIC (ICD-414.8) she denies anginal symptoms. Continue meds as below. Her updated medication list for this problem includes:    Aspirin 81 Mg Tabs (Aspirin) ..... One by mouth daily    Coreg 6.25 Mg Tabs (Carvedilol) .Marland Kitchen... Take one by mouth bid    Lisinopril-hydrochlorothiazide 10-12.5 Mg Tabs (Lisinopril-hydrochlorothiazide) .Marland Kitchen... Take one by mouth daily    Cordarone 200 Mg Tabs (Amiodarone hcl) ..... One half by mouth daily    Plavix 75 Mg Tabs (Clopidogrel bisulfate) .Marland Kitchen... Take one tablet by mouth daily  Problem # 3:  ATRIAL FIBRILLATION (ICD-427.31) Her symptoms are well controlled.  Continue current meds. Her updated medication list for this problem includes:    Aspirin 81 Mg Tabs (Aspirin) ..... One by mouth daily    Coreg 6.25 Mg Tabs (Carvedilol) .Marland Kitchen... Take one by mouth bid    Cordarone 200 Mg Tabs (Amiodarone hcl) ..... One half by mouth daily    Plavix 75 Mg Tabs (Clopidogrel bisulfate) .Marland Kitchen... Take one tablet by mouth daily  Patient Instructions: 1)  Your physician recommends that you schedule a follow-up appointment in: 3 months with the device clinic and 6 months with Dr Ladona Ridgel

## 2010-06-30 NOTE — Procedures (Signed)
Summary: device check   Current Medications (verified): 1)  Aspirin 81 Mg Tabs (Aspirin) .... One By Mouth Daily 2)  Coreg 6.25 Mg  Tabs (Carvedilol) .... Take One By Mouth Bid 3)  Lisinopril-Hydrochlorothiazide 10-12.5 Mg  Tabs (Lisinopril-Hydrochlorothiazide) .... Take One By Mouth Daily 4)  Crestor 10 Mg  Tabs (Rosuvastatin Calcium) .... Take One By Mouth Daily 5)  Cordarone 200 Mg Tabs (Amiodarone Hcl) .... One Half By Mouth Daily 6)  Plavix 75 Mg Tabs (Clopidogrel Bisulfate) .... Take One Tablet By Mouth Daily 7)  Synthroid 50 Mcg Tabs (Levothyroxine Sodium) .... One By Mouth Once Daily  Allergies (verified): No Known Drug Allergies    ICD Specifications Following MD:  Lewayne Bunting, MD     ICD Vendor:  St Jude     ICD Model Number:  [phone]     ICD Serial Number:  [id number] ICD DOI:  08/29/2006     ICD Implanting MD:  Lewayne Bunting, MD  Lead 1:    Location: RA     DOI: 08/29/2006     Model #: 1788TC     Serial #: [id number]     Status: active Lead 2:    Location: RV     DOI: 08/29/2006     Model #: 6962     Serial #: [id number]     Status: active Lead 3:    Location: LV     DOI: 08/29/2006     Model #: 1158T     Serial #: [id number]     Status: active  Indications::  ICM, CHF   ICD Follow Up Battery Voltage:  2.56 V     Charge Time:  14.5 seconds     Battery Est. Longevity:  1.63yr Underlying rhythm:  SINUS BRADY ICD Dependent:  Yes       ICD Device Measurements Atrium:  Amplitude: 1.2 mV, Impedance: 400 ohms, Threshold: 0.5 V at 0.5 msec Right Ventricle:  Amplitude: 11.5 mV, Impedance: 530 ohms, Threshold: 0.75 V at 0.5 msec Left Ventricle:  Impedance: 810 ohms, Threshold: 2.75 V at 1.5 msec Configuration: LV TIP TO RV COIL Shock Impedance: 46 ohms   Episodes MS Episodes:  209     Percent Mode Switch:  <1%     Coumadin:  No Shock:  0     ATP:  0     Nonsustained:  0     Atrial Therapies:  0 Atrial Pacing:  86%     Ventricular Pacing:  90%  Brady Parameters Mode DDDR     Lower  Rate Limit:  70     Upper Rate Limit 110 PAV 160     Sensed AV Delay:  130  Tachy Zones VF:  240     VT:  200     VT1:  150     Tech Comments:  209 AMS EPISODES--LONGEST WAS 18 SECONDS.  NORMAL DEVICE FUNCTION. CHANGED LV OUTPUT FROM 3.5 TO 3.75 DUE TO LV THRESHOLD BEING 2.75 @ 1.49ms.    ROV IN 3 MTHS W/CLINIC. Vella Kohler  December 16, 2009 10:29 AM

## 2010-06-30 NOTE — Assessment & Plan Note (Signed)
Summary: cold//sore throat//lch   Vital Signs:  Patient profile:   77 year old female Height:      63 inches Weight:      212.75 pounds BMI:     37.82 Temp:     98.7 degrees F oral Pulse rate:   80 / minute Pulse rhythm:   regular BP sitting:   122 / 82  (left arm) Cuff size:   large  Vitals Entered By: Lewanda Rife LPN (March 23, 2010 4:09 PM) CC: cold, raspy voice, productive cough with yellow phlegm   History of Present Illness: has not had to come in a while  has had a lot of cardiac issues also AAA- waiting to hear on recent cat scan  woke up last monday -- 1 week ago hoarseness and sore throat -- tried to gargle with vinigar and salt water / used vics  then tried some mucinex from the pharmacy (plain)  not doing much  still coughing and that makes her head and torso hurt  is tired   voice is improved  cough is worse  productive of thick yellow mucous -- hard to get it out  no fever  no n/v   Allergies (verified): No Known Drug Allergies  Past History:  Past Medical History: Last updated: 06/05/2008 Atrial fibrillation-S/P ATRIAL FIB ABLATION 01/2008 Hyperlipidemia Myocardial infarction, hx of Osteoarthritis Current Problems:  ICD - IN SITU (ICD-V45.02) PACEMAKER (ICD-V45.Marland Kitchen01) LBBB (ICD-426.3) VENTRICULAR FIBRILLATION (ICD-427.41) CARDIOMYOPATHY, ISCHEMIC (ICD-414.8) Hx of GLAUCOMA (ICD-365.9) Hx of UTERINE CANCER, HX OF (ICD-V10.42) OSTEOARTHRITIS (ICD-715.90) MYOCARDIAL INFARCTION, HX OF (ICD-412) HYPERLIPIDEMIA (ICD-272.4) ATRIAL FIBRILLATION (ICD-427.31)  Past Surgical History: Last updated: 06/05/2008 Coronary artery bypass graft (1995) Cataract extraction Cholecystectomy Hysterectomy- total (5621) PTCA/stent (1998) Hernia repair Echo (07/2001) ? Dexa- ok per pt (2002) Hosp- CAD, VT, implant defib (07/2006) RESULTS:  This demonstrates successful implantation of a [employer] bi-V  ICD in a patient with an ischemic cardiomyopathy,  congestive heart  failure, left bundle branch, and ventricular tachycardia (symptomatic).  Family History: Last updated: 01/06/07 Father: MI Mother: died age 81. diverticulitis Siblings: twin sisters, one with ovarian cancer  Social History: Last updated: 06-Jan-2007 Marital Status: widowed Children: 1 son Occupation: retired  Risk Factors: Smoking Status: quit (01/06/2007)  Review of Systems General:  Complains of fatigue; denies chills, fever, and loss of appetite. Eyes:  Denies blurring, discharge, and eye irritation. ENT:  Complains of nasal congestion, postnasal drainage, and sore throat; denies earache and sinus pressure. CV:  Denies chest pain or discomfort and palpitations. Resp:  Complains of cough and sputum productive; denies pleuritic and shortness of breath. GI:  Denies abdominal pain, bloody stools, and change in bowel habits. Derm:  Denies rash.  Physical Exam  General:  well appearing with cough Head:  normocephalic, atraumatic, and no abnormalities observed.  no facial tenderness  Eyes:  vision grossly intact, pupils equal, pupils round, and pupils reactive to light.  no conjunctival pallor, injection or icterus  Ears:  R ear normal and L ear normal.   Nose:  nares are injected and congested bilaterally  Mouth:  pharynx pink and moist, no erythema, and no exudates.   Neck:  No deformities, masses, or tenderness noted. Lungs:  harsh bs at bases - no rales or rhonchi good air exch without wheeze harsh sounding cough  Heart:  RRR Msk:  No deformity or scoliosis noted of thoracic or lumbar spine.   Skin:  Intact without suspicious lesions or rashes Cervical Nodes:  No lymphadenopathy  noted Psych:  normal affect, talkative and pleasant    Impression & Recommendations:  Problem # 1:  BRONCHITIS- ACUTE (ICD-466.0)  with chest congestion and prod cough s/p uri recommend sympt care- see pt instructions   pt advised to update me if symptoms worsen or do not  improve cover with zithromax  Her updated medication list for this problem includes:    Mucinex 600 Mg Xr12h-tab (Guaifenesin) ..... Otc as directed.    Zithromax Z-pak 250 Mg Tabs (Azithromycin) .Marland Kitchen... Take by mouth as directed    Tessalon 200 Mg Caps (Benzonatate) .Marland Kitchen... 1 by mouth three times a day as needed cough  Orders: Prescription Created Electronically [phone])  Complete Medication List: 1)  Aspirin 81 Mg Tabs (Aspirin) .... One by mouth daily 2)  Coreg 6.25 Mg Tabs (Carvedilol) .... Take one by mouth twice a day 3)  Lisinopril-hydrochlorothiazide 10-12.5 Mg Tabs (Lisinopril-hydrochlorothiazide) .... Take one by mouth daily 4)  Crestor 10 Mg Tabs (Rosuvastatin calcium) .... Take one by mouth daily 5)  Cordarone 200 Mg Tabs (Amiodarone hcl) .... One half by mouth daily 6)  Plavix 75 Mg Tabs (Clopidogrel bisulfate) .... Take one tablet by mouth daily 7)  Synthroid 50 Mcg Tabs (Levothyroxine sodium) .... One by mouth once daily 8)  Multivitamins Tabs (Multiple vitamin) .... Once daily 9)  Mucinex 600 Mg Xr12h-tab (Guaifenesin) .... Otc as directed. 10)  Zithromax Z-pak 250 Mg Tabs (Azithromycin) .... Take by mouth as directed 11)  Tessalon 200 Mg Caps (Benzonatate) .Marland Kitchen.. 1 by mouth three times a day as needed cough  Patient Instructions: 1)  you can try mucinex over the counter twice daily as directed and nasal saline spray for congestion 2)  tylenol over the counter as directed may help with aches, headache and fever 3)  try the tessalon for cough  4)  take the zithromax for bronchitis  5)  call if symptoms worsen or if not improved in 4-5 days -- especially if worse cough or any fever  Prescriptions: TESSALON 200 MG CAPS (BENZONATATE) 1 by mouth three times a day as needed cough  #30 x 0   Entered and Authorized by:   Judith Part MD   Signed by:   Judith Part MD on 03/23/2010   Method used:   [facility] to        [facility] (retail[facility]        [address]       [place], [place]  [address]       Ph: [phone] or [phone]       Fax: [phone]   RxID:   [id number] ZITHROMAX Z-PAK 250 MG TABS (AZITHROMYCIN) take by mouth as directed  #1 pack x 0   Entered and Authorized by:   Judith Part MD   Signed by:   Judith Part MD on 03/23/2010   Method used:   [facility]        [facility]       [address]       [place], [place]  [address]       Ph: [phone] or [phone]       Fax: [phone]   RxID:   [id number]    Orders Added: 1)  Est. Patient Level III [[address]] 2)  Prescription Created Electronically [phone]    Current Allergies (reviewed today): No known allergies

## 2010-06-30 NOTE — Progress Notes (Signed)
Summary: [facility]ill[facility]  Phone Note Refill Request Call back at Home Phone [phone] Message from:  Patient on July 15, 2009 12:10 PM  Refills Requested: Medication #1:  CORDARONE 200 MG TABS one half by mouth daily   Supply Requested: 3 months  Medication #2:  SYNTHROID 50 MCG TABS one by mouth once daily.   Supply Requested: 3 months [facility]    Method Requested: Fax to [facility] Initial call taken by: Migdalia Dk,  July 15, 2009 12:11 PM  Follow-up for Phone Call        Prescriptions refills sended to [facility] on 06-06-09 per Jewel. 90 days supply plus 1 refill. Pt. states she only get the Synthroid. She will pick up some tablets at her local pharmacy until she gets Amiodarone. I will send the refill again Follow-up by: Vikki Ports,  July 17, 2009 9:29 AM    Prescriptions: CORDARONE 200 MG TABS (AMIODARONE HCL) one half by mouth daily  #90 x 1   Entered by:   Vikki Ports   Authorized by:   Laren Boom, MD, [facility]   Signed by:   Vikki Ports on 07/17/2009   Method used:   Faxed to ...       [facility] (mail-order)             , [place]         Ph:        Fax: [phone]   RxID:   [id number]

## 2010-07-02 NOTE — Cardiovascular Report (Signed)
Summary: Office Visit   Office Visit   Imported By: Roderic Ovens 06/19/2010 16:17:34  _____________________________________________________________________  External Attachment:    Type:   Image     Comment:   External Document

## 2010-07-02 NOTE — Procedures (Signed)
Summary: DEVICE CHECK/SL   Current Medications (verified): 1)  Aspirin 81 Mg Tabs (Aspirin) .... One By Mouth Daily 2)  Coreg 6.25 Mg  Tabs (Carvedilol) .... Take One By Mouth Twice A Day 3)  Lisinopril-Hydrochlorothiazide 10-12.5 Mg  Tabs (Lisinopril-Hydrochlorothiazide) .... Take One By Mouth Daily 4)  Crestor 10 Mg  Tabs (Rosuvastatin Calcium) .... Take One By Mouth Daily 5)  Cordarone 200 Mg Tabs (Amiodarone Hcl) .... One Half By Mouth Daily 6)  Plavix 75 Mg Tabs (Clopidogrel Bisulfate) .... Take One Tablet By Mouth Daily 7)  Synthroid 50 Mcg Tabs (Levothyroxine Sodium) .... One By Mouth Once Daily 8)  Multivitamins   Tabs (Multiple Vitamin) .... Once Daily 9)  Mucinex 600 Mg Xr12h-Tab (Guaifenesin) .... Otc As Directed.  Allergies (verified): No Known Drug Allergies   ICD Specifications Following MD:  Lewayne Bunting, MD     ICD Vendor:  [employer]     ICD Model Number:  [phone]     ICD Serial Number:  [id number] ICD DOI:  08/29/2006     ICD Implanting MD:  Lewayne Bunting, MD  Lead 1:    Location: RA     DOI: 08/29/2006     Model #: [id number]     Serial #: [id number]     Status: active Lead 2:    Location: RV     DOI: 08/29/2006     Model #: [id number]     Serial #: [id number]     Status: active Lead 3:    Location: LV     DOI: 08/29/2006     Model #: 1158T     Serial #: [id number]     Status: active  Indications::  ICM, CHF   ICD Follow Up Remote Check?  No Battery Voltage:  2.56 V     Charge Time:  14.8 seconds     Battery Est. Longevity:  1.1 years Underlying rhythm:  dependent ICD Dependent:  Yes       ICD Device Measurements Atrium:  Amplitude: 1.6 mV, Impedance: 390 ohms, Threshold: 0.5 V at 0.5 msec Right Ventricle:  Amplitude: 12 mV, Impedance: 490 ohms, Threshold: 1.0 V at 0.5 msec Left Ventricle:  Impedance: 760 ohms, Threshold: 3.0 V at 1.5 msec Configuration: LV TIP TO RV COIL Shock Impedance: 46 ohms   Episodes MS Episodes:  127     Percent Mode Switch:  1%     Coumadin:  No Shock:   0     ATP:  0     Nonsustained:  0     Atrial Pacing:  83%     Ventricular Pacing:  83%  Brady Parameters Mode DDDR     Lower Rate Limit:  70     Upper Rate Limit 110 PAV 160     Sensed AV Delay:  130  Tachy Zones VF:  240     VT:  200     VT1:  150     Next Cardiology Appt Due:  08/30/2010 Tech Comments:  No parameter changes.  LV threshold chronic 3.0 @1 .5.  No Merlin @ this time.  ROV 3 months with Dr. Ladona Ridgel. Altha Harm, LPN  June 17, 2010 10:14 AM

## 2010-07-03 NOTE — Letter (Signed)
Summary: [facility]   Imported By: Lanelle Bal 09/03/2009 08:26:09  _____________________________________________________________________  External Attachment:    Type:   Image     Comment:   External Document

## 2010-07-03 NOTE — Progress Notes (Signed)
Summary: [facility] Office Note   [facility] Office Note   Imported By: Roderic Ovens 01/06/2010 14:57:47  _____________________________________________________________________  External Attachment:    Type:   Image     Comment:   External Document

## 2010-07-03 NOTE — Cardiovascular Report (Signed)
Summary: Office Visit   Office Visit   Imported By: Roderic Ovens 12/30/2009 15:50:59  _____________________________________________________________________  External Attachment:    Type:   Image     Comment:   External Document

## 2010-07-08 NOTE — Progress Notes (Signed)
Summary: refill request  Phone Note From Pharmacy   CallerRella Larve [phone] ref #[id number] Summary of Call: refill of carvedilol Initial call taken by: Glynda Jaeger,  June 29, 2010 2:11 PM    Prescriptions: COREG 6.25 MG  TABS (CARVEDILOL) take one by mouth twice a day  #180 x 1   Entered by:   Laurance Flatten CMA   Authorized by:   Laren Boom, MD, [facility]   Signed by:   Laurance Flatten CMA on 07/01/2010   Method used:   Faxed to ...       [facility] (mail-order)             , [place]         Ph:        Fax: [phone]   RxID:   [id number]

## 2010-07-08 NOTE — Progress Notes (Signed)
Summary: [facility] has questions  Phone Note From Pharmacy   Caller: [facility] [phone] ref [id number] Summary of Call: [facility] has question re rx Initial call taken by: Glynda Jaeger,  June 30, 2010 1:33 PM    Prescriptions: COREG 6.25 MG  TABS (CARVEDILOL) take one by mouth twice a day  #180 x 1   Entered by:   Laurance Flatten CMA   Authorized by:   Laren Boom, MD, [facility]   Signed by:   Laurance Flatten CMA on 07/01/2010   Method used:   Faxed to ...       [facility] (mail-order)             , [place]         Ph:        Fax: [phone]   RxID:   [id number]

## 2010-07-27 ENCOUNTER — Telehealth: Payer: Self-pay | Admitting: Internal Medicine

## 2010-08-06 NOTE — Progress Notes (Signed)
Summary: refill meds  Phone Note Refill Request Call back at Home Phone [phone] Message from:  Patient on July 27, 2010 10:02 AM  Refills Requested: Medication #1:  PLAVIX 75 MG TABS Take one tablet by mouth daily [facility]. fax [phone]   Method Requested: Fax to [facility] Pharm[facility] Initial call taken by: Lorne Skeens,  July 27, 2010 10:03 AM    Prescriptions: PLAVIX 75 MG TABS (CLOPIDOGREL BISULFATE) Take one tablet by mouth daily  #90 x 3   Entered by:   Laurance Flatten CMA   Authorized by:   Laren Boom, MD, [facility]   Signed by:   Laurance Flatten CMA on 07/29/2010   Method used:   Faxed to ...       [facility] (mail-order)             , [place]         Ph:        Fax: [phone]   RxID:   [id number]

## 2010-08-19 ENCOUNTER — Encounter (INDEPENDENT_AMBULATORY_CARE_PROVIDER_SITE_OTHER): Payer: Self-pay | Admitting: *Deleted

## 2010-08-23 ENCOUNTER — Other Ambulatory Visit: Payer: Self-pay | Admitting: Internal Medicine

## 2010-08-27 NOTE — Telephone Encounter (Signed)
[address] °

## 2010-08-27 NOTE — Letter (Signed)
Summary: Appointment - Reschedule  [facility], Main Office  [address]   [place], [place] [address]   Phone: [phone]  Fax: [phone]     August 19, 2010 MRN: [id number]   [facility] [place] [address] [place], [place]  [address]   Dear Ms. Megill,   Due to a change in our office schedule, your appointment on  09-03-10  at  11:00am             must be changed.  It is very important that we reach you to reschedule this appointment. We look forward to participating in your health care needs. Please contact us at the number listed above at your earliest convenience to reschedule this appointment.     Sincerely,  Glass blower/designer

## 2010-09-02 ENCOUNTER — Ambulatory Visit (INDEPENDENT_AMBULATORY_CARE_PROVIDER_SITE_OTHER): Payer: Medicare Other | Admitting: *Deleted

## 2010-09-02 DIAGNOSIS — I2589 Other forms of chronic ischemic heart disease: Secondary | ICD-10-CM

## 2010-09-03 ENCOUNTER — Encounter: Payer: Self-pay | Admitting: *Deleted

## 2010-09-09 LAB — CBC
HCT: 42.4 % (ref 36.0–46.0)
Hemoglobin: 14.5 g/dL (ref 12.0–15.0)
MCHC: 34.3 g/dL (ref 30.0–36.0)
MCV: 89 fL (ref 78.0–100.0)
Platelets: 140 10*3/uL — ABNORMAL LOW (ref 150–400)
RBC: 4.76 MIL/uL (ref 3.87–5.11)
RDW: 14.6 % (ref 11.5–15.5)
WBC: 5.4 10*3/uL (ref 4.0–10.5)

## 2010-09-09 LAB — TYPE AND SCREEN
ABO/RH(D): O NEG
Antibody Screen: NEGATIVE

## 2010-09-09 LAB — POCT I-STAT, CHEM 8
BUN: 21 mg/dL (ref 6–23)
Chloride: 105 mEq/L (ref 96–112)
HCT: 43 % (ref 36.0–46.0)
Hemoglobin: 14.6 g/dL (ref 12.0–15.0)
Potassium: 3.9 mEq/L (ref 3.5–5.1)
Sodium: 139 mEq/L (ref 135–145)
TCO2: 26 mmol/L (ref 0–100)

## 2010-09-09 LAB — DIFFERENTIAL
Basophils Relative: 1 % (ref 0–1)
Eosinophils Relative: 1 % (ref 0–5)
Lymphs Abs: 0.8 10*3/uL (ref 0.7–4.0)
Monocytes Absolute: 0.5 10*3/uL (ref 0.1–1.0)

## 2010-09-09 LAB — PROTIME-INR
INR: 2.2 — ABNORMAL HIGH (ref 0.00–1.49)
Prothrombin Time: 26.1 seconds — ABNORMAL HIGH (ref 11.6–15.2)

## 2010-09-09 LAB — POCT CARDIAC MARKERS: Myoglobin, poc: 118 ng/mL (ref 12–200)

## 2010-09-14 LAB — PROTIME-INR
INR: 2.1 — ABNORMAL HIGH (ref 0.00–1.49)
Prothrombin Time: 25.3 seconds — ABNORMAL HIGH (ref 11.6–15.2)

## 2010-10-13 NOTE — Op Note (Signed)
[name], [name]              ACCOUNT NO.:  [id number]   MEDICAL RECORD NO.:  [id number]          PATIENT TYPE:  OIB   LOCATION:  2899                         FACILITY:  [facility]   PHYSICIAN:  Rollene Rotunda, MD, FACCDATE OF BIRTH:  [birth year]   DATE OF PROCEDURE:  06/17/2008  DATE OF DISCHARGE:  06/17/2008                               OPERATIVE REPORT   PRIMARY CARE PHYSICIAN/CARDIOLOGIST:  Antionette Char, MD   ELECTROPHYSIOLOGIST:  Doylene Canning. Ladona Ridgel, MD   PROCEDURE:  Direct current cardioversion.   INDICATION:  Symptomatic atrial fibrillation and flutter.   PROCEDURE NOTE:  The patient had documented INRs greater than 2 for  greater than 4 weeks including today.  She is signed appropriate  informed consent.  [employer] representatives were available to  interrogate her defibrillator.  Defibrillator pads were placed well away  from the defibrillator and anteroposterior ray.  The patient was given  anesthesia per Dr. Kyung Rudd with 100 mg of Pentothal.  Her vital signs  and oxygenation were maintained throughout.  She was successfully  cardioverted x1 with 120 joules of biphasic energy.  This resulted in  normal sinus rhythm.  Her device was reprogrammed from VVI to DDD with a  rate of 70.  All parameters and thresholds remained otherwise unchanged.  There were no apparent complications.  I will arrange follow up.  The  patient already has a scheduled appointment with Dr. Aleen Campi.  I will  arrange followup with Dr. Ladona Ridgel for the next couple of weeks.      Rollene Rotunda, MD, [facility]  Electronically Signed     JH/MEDQ  D:  06/17/2008  T:  06/18/2008  Job:  [id number]   cc:   Antionette Char, MD

## 2010-10-13 NOTE — Letter (Signed)
December 13, 2006    Antionette Char, MD  [address]  [place], [place] [address]   RE:  [name], [name]  MRN:  [id number]  /  DOB:  Oct 09, 1921   Dear Jonny Ruiz:   Today we saw Ms. Ladean Steinmeyer in the [facility] for followup of her  biventricular ICD which was placed back in March of this year.  As you  know, she is a very pleasant elderly woman with an ishemic  cardiomyopathy, congestive heart failure and fairly significant and  dense non-sustained VT, in the setting of a left bundle branch block.  For all of the above, she underwent a biventricular ICD implantation and  returns today for followup.  Her heart failure has gone from class 3 to  class 2, and though she still has some limitations, she is otherwise  feeling well.   I am really writing today to let you know that her hysterogram on her  defibrillator demonstrate that she clearly goes into brief episodes of  atrial fibrillation.  She had 84 modes which is present with clear  electrocardiograms demonstrating some of these to be due to atrial  fibrillation.  The patient is not symptomatic from these.  Today we  discussed the importance of thromboembolic prevention for atrial  fibrillation and there would  be consideration for Coumadin therapy to  be initiated.  Despite my concerns that I have given to her about the  importance for being on Coumadin, in terms of preventing stroke, the  patient is not particularly inclined to start Coumadin today, despite  the potential risks for thromboembolic complications and stroke.  I have  told her that I will refer the information about the need for Coumadin  on to you, and that she can discuss this further, as she has an  appointment scheduled in your office.  Of note, I did not make any  changes in her medications today.   Overall, I think that Ms. Agro would be at a lower risk for stroke  on Coumadin; however, I agree that with her advanced age and other  comorbidities,  there is certainly some risk for being on Coumadin as  well.  With all of this being said, the patient is not inclined to take  Coumadin, at least based on my recommendations.  Perhaps she will have  additional consideration after discussing these issues with yourself, as  I know you have taken care of this patient for many, many years.   Otherwise, I hope all is well.    Sincerely,      Doylene Canning. Ladona Ridgel, MD  Electronically Signed    GWT/MedQ  DD: 12/13/2006  DT: 12/14/2006  Job #: [id number]

## 2010-10-13 NOTE — Cardiovascular Report (Signed)
[name], [name]              ACCOUNT NO.:  [id number]   MEDICAL RECORD NO.:  [id number]          PATIENT TYPE:  INP   LOCATION:  2916                         FACILITY:  [facility]   PHYSICIAN:  Arturo Morton. Riley Kill, MD, FACCDATE OF BIRTH:  [birth year]   DATE OF PROCEDURE:  02/15/2008  DATE OF DISCHARGE:                            CARDIAC CATHETERIZATION   INDICATIONS:  [name] is a delightful 77 year old who has  previously undergone revascularization surgery in 1995 by Dr. Tyrone Sage,  and also has gone under stenting with an endeavor stent by Dr. Elsie Lincoln  approximately 1 year ago.  She underwent an ablation.  She has had some  VT, which has now settled down.  The current study was done because of  the ventricular tachycardia to see if it was ischemia mediated.  She was  seen by Dr. Graciela Husbands, her Coumadin anticoagulation reversed, and brought to  the catheterization laboratory for diagnostic catheterization.  I have  met the patient and discussed this with her.   PROCEDURES:  1. Left heart catheterization.  2. Selective coronary arteriography.  3. Selective left ventriculography.  4. Saphenous vein graft angiography.   DESCRIPTION OF PROCEDURE:  The patient was brought to the cath lab,  prepped and draped in usual fashion.  Through an anterior puncture, the  right femoral artery was easily entered and a 5-French sheath was  placed.  Following this, views of the left and right coronary arteries  were obtained.  Vein graft angiography was performed.  We required a  left bypass to engage both of the bypasses.  She tolerated the procedure  extremely well and there were no major complications.   HEMODYNAMIC DATA:  1. Central aortic pressure was 99/60 with a mean of 76.  2. Left ventricular pressure 107/12.  3. There was no gradient or pullback across the aortic valve.   ANGIOGRAPHIC DATA:  1. The left main coronary was free of critical disease.  2. The LAD is severely diseased  proximally and just occluded just      after a stent with a faint visualization of both septal and      diagonal.  3. The saphenous vein graft to the distal LAD appears to be widely      patent.  It does fill retrograde and through retrograde point,      there is a subtotal occlusion that leads into small to moderate      diagonal branch.  However, the distal LAD wraps the apex and is      free and clear of disease.  4. The circumflex is a large-caliber vessel.  There is some mild      luminal irregularity throughout.  The vessel had been previously      stented distally near bifurcation.  The stent itself appears to be      patent without significant narrowing.  The branch just above the      bifurcation also is patent.  The inferior branch proximal is patent      as well.  5. The right coronary artery is severely diseased and totally occluded  in the midportion.  6. The saphenous vein graft to the PDA is widely patent and fills      retrograde.  7. Ventriculography in the RAO projection reveals severe reduction in      left ventricular function.  The entire inferior wall was akinetic.      There is at least mild mitral regurgitation.  Ejection fraction be      estimated at 20%.   CONCLUSION:  1. Moderately severe reduction in left ventricular function with an      ejection fraction in the range of 20%, mild mitral regurgitation.  2. Continued patency of the saphenous vein graft to the LAD.  3. Continued patency of saphenous vein graft to the PDA.  4. Continued patency of the stent to the circumflex artery.   DISPOSITION:  The patient will continued to be followed by Dr. Aleen Campi  and Dr. Graciela Husbands.  Her sheath will be removed when her ACT is less than  150.      Arturo Morton. Riley Kill, MD, [facility]  Electronically Signed     TDS/MEDQ  D:  02/15/2008  T:  02/16/2008  Job:  [id number]   cc:   Duke Salvia, MD, [facility]  Antionette Char, MD  Madaline Savage, M.D.

## 2010-10-13 NOTE — Assessment & Plan Note (Signed)
[facility]                         ELECTROPHYSIOLOGY OFFICE NOTE   NAME:[name], Stephanie HOAR                     MRN:          [id number]  DATE:06/11/2008                            DOB:          08-15-1921    [name] returns today for followup.  She is a very pleasant elderly  woman with a history of ventricular tachycardia as well as atrial  fibrillation, left bundle-branch block, and congestive heart failure.  She is status post BiV ICD insertion.  She returns today for followup.  We have had her on amiodarone now for several months with plans of  cardioverting her back to sinus rhythm unless she went back to sinus  spontaneously which she has not.  She returns today for followup.  She  has fatigue and weakness, but mostly shortness of breath with exertion.  She denied chest pain.  She has mild peripheral edema.   CURRENT MEDICATIONS:  1. Lisinopril/hydrochlorothiazide 10/12.5 daily.  2. Crestor 10 a day.  3. Multivitamin.  4. Coumadin as directed.  5. Aspirin 81 a day.  6. Coreg 6.25 twice daily.  7. Amiodarone 200 twice a day.   PHYSICAL EXAMINATION:  GENERAL:  She is a pleasant elderly-appearing  woman in no acute distress.  VITAL SIGNS:  Blood pressure today was 116/76, the pulse is 76 and  regular, respirations were 18, weight was 222 pounds, up 6 pounds from  her visit back in October 2009.  NECK:  No jugular venous distention.  LUNGS:  Clear bilaterally to auscultation except for rales in the bases  bilaterally.  There are no wheezes or rhonchi.  ABDOMEN:  Soft and nontender.  EXTREMITIES:  No edema.   Interrogation of her defibrillator demonstrates underlying AFib.  R-  waves were 11, the impedance 460, the threshold of 1 volt at 0.5, the LV  thresholds was 3.5 at 1.  There are no intercurrent therapies.  Her  underlying rhythm was heart block at 35.  She was 97% V paced.   IMPRESSION:  1. Symptomatic atrial fibrillation.  2.  Ischemic cardiomyopathy, status post myocardial infarction with      congestive heart failure.  3. Complete heart block.  4. Status post implantable cardioverter-defibrillator insertion.  5. Chronic Coumadin therapy.   DISCUSSION:  [name] is stable.  She has been maintained on  amiodarone now for several months.  It is time to proceed with DC  cardioversion.  Review of her INRs from Dr. Adelene Idler office  demonstrates INR of 2.2 on June 06, 2008, and INR of 2.1 on May 07, 2008, and INR of 2.4 on April 04, 2008, and INR of 2.2 on March 21, 2008.  I will plan to proceed with DC cardioversion at earliest possible  scheduled time.     Doylene Canning. Ladona Ridgel, MD  Electronically Signed    GWT/MedQ  DD: 06/11/2008  DT: 06/12/2008  Job #: [id number]   cc:   Antionette Char, MD

## 2010-10-13 NOTE — Op Note (Signed)
NAMECHINENYE, KATZENBERGER NO.:  [id number]   MEDICAL RECORD NO.:  [id number]          PATIENT TYPE:  INP   LOCATION:  2916                         FACILITY:  [facility]   PHYSICIAN:  Hillis Range, MD       DATE OF BIRTH:  [birth year]   DATE OF PROCEDURE:  02/12/2008  DATE OF DISCHARGE:                               OPERATIVE REPORT   PRIMARY SURGEON:  Duke Salvia, MD, [facility]   FIRST ASSISTANT:  Hillis Range, MD   PREPROCEDURE DIAGNOSES:  1. Atrial fibrillation with rapid ventricular rates.  2. Left bundle-branch block.  3. Status post biventricular implantable cardioverter-defibrillator,      previously implanted.  4. New York Heart Association class III heart failure.  5. Ischemic cardiomyopathy.  6. Coronary artery disease.   POSTPROCEDURE DIAGNOSES:  1. Atrial fibrillation with rapid ventricular rates.  2. Left bundle-branch block.  3. Status post biventricular implantable cardioverter-defibrillator,      previously implanted.  4. New York Heart Association class III heart failure.  5. Ischemic cardiomyopathy.  6. Coronary artery disease.   PROCEDURES:  Atrioventricular nodal ablation.   DESCRIPTION OF PROCEDURE:  Informed written consent was obtained, and  the patient was brought to the Electrophysiology Lab in the fasting  state.  She was adequately sedated with intravenous Valium and fentanyl  as outlined in nursing report.  The patient's right groin was prepped  and draped in the usual sterile fashion by the EP lab staff.  Using a  percutaneous Seldinger technique, one 8-French hemostasis sheath was  placed in the right common femoral vein.  A 7-French [employer] II 4-mm ablation catheter was introduced through the right common  femoral vein and advanced into the right atrium.  Mapping of the AV  nodal region was then performed using the ablation catheter.  A His  potential could not be easily recognized.  The ablation catheter was  therefore removed.  The 8-French hemostasis sheath was exchanged for an  8-French SL2 transseptal sheath, which was then advanced into the right  atrium.  The ablation catheter was advanced through the SL2 sheath into  the right ventricle and pulled back to the region of the AV node.  Additional mapping was performed in this location without successfully  finding a His potential.  The SL2 sheath was then exchanged for an SRO  sheath and additional mapping of the AV nodal region was performed.  A  sharp His potential was then identified along the distal electrode of  the ablation catheter with HV time measured to be 56 milliseconds.  A  single radiofrequency application was delivered in this location with a  power of 50 degrees with a target temperature of 60 degrees for 30  seconds.  During ablation, accelerated junctional rhythm was produced  followed by complete AV block.  The patient was observed without return  of conduction through her AV node.  The procedure was therefore  considered completed.  Her biventricular ICD was programmed VVIR with a  lower rate limit of 90 beats per minute.  Tachycardia therapies were  programmed  back on.  The procedure was therefore considered completed.  All catheters were removed and sheaths were aspirated and flushed.  The  sheaths were removed and hemostasis was assured.  There were no early  apparent complications.   CONCLUSIONS:  1. Atrial fibrillation with rapid ventricular rates.  2. Successful AV nodal ablation.  3. No early apparent complications.      Hillis Range, MD  Electronically Signed     JA/MEDQ  D:  02/12/2008  T:  02/13/2008  Job:  [id number]   cc:   Antionette Char, MD

## 2010-10-13 NOTE — Discharge Summary (Signed)
[name], [name]              ACCOUNT NO.:  [id number]   MEDICAL RECORD NO.:  [id number]          PATIENT TYPE:  INP   LOCATION:  6736                         FACILITY:  [facility]   PHYSICIAN:  Peggye Pitt, M.D. DATE OF BIRTH:  1921-08-20   DATE OF ADMISSION:  02/09/2008  DATE OF DISCHARGE:  02/18/2008                               DISCHARGE SUMMARY   DISCHARGE DIAGNOSES:  1. Ventricular tachycardia.  2. Atrial fibrillation.  3. Coronary artery disease.  4. Hypertension.  5. Hyperlipidemia.  6. Thrombocytopenia.  7. Systolic congestive heart failure with an ejection fraction of 15-      20% status post an automatic implantable cardioverter-defibrillator      placement.   DISCHARGE MEDICATIONS:  1. Crestor 20 mg p.o. at bedtime.  2. Aspirin 325 mg p.o. daily.  3. Lisinopril 10 mg p.o. daily.  4. Coumadin 5 mg Monday through Friday, 2.5 mg Saturday and Sunday.  5. Xalatan eye drops in the left eye at bedtime.  6. Coreg 6.25 mg one and half tablet everyday.  7. Digoxin 0.062 mg p.o. daily.  8. Amiodarone 400 mg p.o. b.i.d.   CONSULTATIONS DURING THIS HOSPITALIZATION:  [facility] mostly  with Dr. Sherryl Manges.   IMAGES AND PROCEDURES PERFORMED DURING THIS HOSPITALIZATION:  1. A chest x-ray on February 09, 2008 consistent with stable      cardiomegaly.  No active lung disease.  A repeat chest x-ray on      February 13, 2008 that showed a PICC line that was placed in the      superior vena cava.  2. The patient had a cardiac catheterization on February 15, 2008 by      Dr. Shawnie Pons that showed a moderately severe reduction in LV      function with an ejection fraction of 20%, mild mitral      regurgitation, patency of saphenous vein graft to the LAD, patency      of the saphenous vein graft to the PDA, and patency of the stent to      the circumflex artery.   HISTORY AND PHYSICAL EXAM:  For full details, please refer to H&P  dictated by myself on  February 09, 2008 but in brief, Ms. Duclos is a  pleasant 77 year old obese white woman who woke up at about 10 p.m. the  night prior to admission with palpitations that she describes as  hummingbird wings.  She also had a cold diaphoresis, but no chest  pain.  She immediately called the EMS who documented a wide complex  tachycardia.   HOSPITAL COURSE:  1. While the complex tachycardia which ended up being ventricular      tachycardia consultation with [facility] was obtained and Dr.      Sherryl Manges graciously saw the patient in consultation.  Her AICD      was reprogrammed to capture the ventricular tachycardia.  As well      she was also found to be in atrial fibrillation for which she had      an AV nodal ablation.  She was started on amiodarone  initially on      an IV drip which was further transitioned to p.o.  I believe the      cardiologist's goal is to completely wean her off the amiodarone      and slowly increase the Coreg.  Because there was also some concern      that the ventricular tachycardia could have been secondary to      ischemia, she had a repeat cardiac cath which was basically clean      and showed patency of her graft but again showed that her ejection      fraction was severely reduced to 15-20%.  Medical management at      this time.  2. Hypertension.  Her blood pressure remained stable throughout      hospitalization.  3. Hyperlipidemia.  We will continue her statin.  4. For her atrial fibrillation, see plan #1, but as well her INR on      day of discharge is 1.6, so this will need to be closely followed      as an outpatient.  Dr. Earlene Plater has a scheduled appointment with      [facility] early next week and her Coumadin level should be followed.  5. Her thrombocytopenia which was stable at around 108-110      questionable etiology.  6. She had left calf pain but she had no DVT or ruptured Baker's cyst      documented on Doppler.  7. Rest of issues were  stable throughout hospitalization.  8. Vital signs on day of discharge, blood pressure 99/62, heart rate      77, respirations 18, O2 sat 92% on room air with temperature of      98.3.  WBCs 4.3, hemoglobin 11.8 with an MCV of 88.6, platelets of      108 and an INR of 1.6.      Peggye Pitt, M.D.  Electronically Signed     EH/MEDQ  D:  02/18/2008  T:  02/18/2008  Job:  [id number]   cc:   Antionette Char, MD  Pricilla Riffle, MD, [facility]  Duke Salvia, MD, [facility]

## 2010-10-13 NOTE — Consult Note (Signed)
[name], [name]              ACCOUNT NO.:  [id number]   MEDICAL RECORD NO.:  [id number]          PATIENT TYPE:  INP   LOCATION:  3110                         FACILITY:  [facility]   PHYSICIAN:  Stephanie Salvia, MD, FACCDATE OF BIRTH:  May 18, 1922   DATE OF CONSULTATION:  02/10/2008  DATE OF DISCHARGE:                                 CONSULTATION   HISTORY:  Thank you very much for asking [place] to see [name] in  consultation because of broad complex tachycardia.   [name] is a rather spry 77 year old who with ischemic  cardiomyopathy, previous multiple PCI prior bypass surgery, ejection  fraction of 15-20%, class III congestive heart failure with status post  CRT-D implantation about a year and half ago.   On the night of admission, she awakened with tachy palpitations  accompanied by agitation, diffuse diaphoresis, some lightheadedness and  shortness of breath.  The wide complex tachycardia was identified.  Adenosine and amiodarone were administrated without interruption of the  tachycardia.  It spontaneously terminated as best I can gather.  She was  admitted.  She was noted to have positive enzymes with a peak troponin  of 0.32.  CK-MBs were normal.   She is also found to be in atrial fibrillation and this is new in onset  since January 2009.  Coumadin therapy has been initiated.  Of note, also  her ventricular pacing is less than 50% at a lower rate limit of 60.  Her functional capacity remains quite poor.  Apparently, there was  initial good response to CRT, which is subsequently been loss of recent  months.  She has dyspnea on exertion of less than 100 yards and with  modest ADLs.  She has some orthopnea and some peripheral edema.   PAST MEDICAL HISTORY:  In addition to the above is notable for  hypertension.   PAST SURGICAL HISTORY:  Notable for hysterectomy and cholecystectomy as  well as her bypass surgery.   SOCIAL HISTORY:  She is widowed.  She has  children.  She does not use  alcohol or recreational drugs.   REVIEW OF SYSTEMS:  Acutely and more remotely noncontributory.   MEDICATIONS:  On arrival included, lisinopril 10, Crestor 10, carvedilol  6.25 b.i.d. and Coumadin.  She is also on aspirin 325.   ALLERGIES:  She has no known drug allergies.   PHYSICAL EXAMINATION:  GENERAL:  On examination, she is an elderly  Caucasian female, appearing her stated age of 30, but sounding a good  deal of younger.  She was in no acute distress.  VITALS:  Her blood pressure was 112/57.  Her pulse was 75 and irregular.  NECK:  Her neck veins were flat.  Her carotids are brisk and full  bilaterally without bruits.  HEENT:  Exam demonstrated no icterus or xanthoma.  BACK:  Without kyphosis or scoliosis.  LUNGS:  Clear.  HEART:  Sounds were irregular with a 2/6 murmur.  The PMI was not  appreciated because she was examined in the upright position.  ABDOMEN:  Soft with active bowel sounds.  Femoral pulses were not  examined.  Distal pulses were trace.  There is no clubbing or cyanosis.  There is  1+ peripheral edema.  NEUROLOGICAL:  Grossly normal.  SKIN:  Warm and dry.   LABORATORY DATA:  Electrocardiogram from February 09, 2008,  demonstrated atrial fibrillation with a broad complex left bundle, the  QRS duration was measured about 180 milliseconds.  This is in  distinction to her left bundle-branch block ventricular tachycardia with  a QRS duration is about 165 milliseconds.  The transitions were now  noted in lead V3 and the axis was normal as opposed to leftward in  atrial fibrillation.   Interrogation of her defibrillator demonstrates that no intercurrent  episodes were recorded as would be anticipated from the rates as noted  in HPI.  Atrial fibrillation is detected as of January 2009.   IMPRESSION:  1. Ventricular tachycardia sustained a monomorphic rate of 163 below      the detection rate of 176.  2. Atrial fibrillation -  persistence since January 2009 with rates      overriding cardiac resynchronization pacing.  3. Ischemic heart disease with,      a.     Prior myocardial infarction.      b.     Prior percutaneous coronary intervention.      c.     Prior coronary artery bypass graft.      d.     Ejection fraction of 15%-20%.      e.     Intercurrent non-STEMI.  4. Left bundle-branch block.  5. Congestive heart failure - class III, previously class II.  6. Status post [employer] CRT-D.  7. Coumadin for #2 with an INR 3.5.   DISCUSSION:  [name] has atrial fibrillation contributing to  worsening of her congestive heart failure.  It is poorly controlled and  overriding her resynchronization pacing resulting in ventricular pacing  at a maximum of about 50%.  Augmented rate control is one step that can  be pursued; AV junction ablation may be more effective in accomplishing  at 95-98% ventricular pacing, which studies have suggested is optimal  for proving the benefits of resynchronization pacing.   Her ventricular tachycardia occurred below her detection rate.  We will  reprogram her ICD to a detection rate of 150 beats per minute.  Initial  allow was to detect her ventricular tachycardia episodes and hopefully  antitachycardia pacing without defibrillator shock therapy will be  sufficient for conversion to sinus rhythm.   Based on the above, I have therefore recommend:  1. Reprogrammed of the ICD is noted.  2. Consider AV junction ablation for elimination of antegrade      conduction for atrial fibrillation with a rapid ventricular      response.   Alternative option would be is to increase her Coreg and digoxin both of  which I am going to do anyway, but this would require Holter followup  and is unlikely to accomplish the high degree of ventricular pacing that  we need for her cardiac resynchronization.   I will review this with Dr. Aleen Campi on Monday.  Thank you for the   consultations.      Stephanie Salvia, MD, [facility]  Electronically Signed     SCK/MEDQ  D:  02/10/2008  T:  02/10/2008  Job:  [id number]   cc:   Stephanie Char, MD

## 2010-10-13 NOTE — H&P (Signed)
[name], [name]              ACCOUNT NO.:  [id number]   MEDICAL RECORD NO.:  [id number]          PATIENT TYPE:  INP   LOCATION:  3110                         FACILITY:  [facility]   PHYSICIAN:  Peggye Pitt, M.D. DATE OF BIRTH:  [birth year]   DATE OF ADMISSION:  02/09/2008  DATE OF DISCHARGE:                              HISTORY & PHYSICAL   PRIMARY CARE PHYSICIAN:  Antionette Char, MD   CHIEF COMPLAINT:  Palpitations.   HISTORY OF PRESENT ILLNESS:  [name] is a pleasant 77 year old  obese white woman who woke up at about 10 p.m. with palpitations that  she describes as hummingbird wings.  She broke into a cold sweat but  denied any chest pain.  EMS gave her adenosine and amiodarone with  resolution of the wide complex tachycardia.  She denies any recent  illness and otherwise, feels fine.   ALLERGIES:  She has no known drug allergies.   PAST MEDICAL HISTORY:  Significant for  1. Coronary artery disease status post CABG in 1999.  2. Systolic congestive heart failure with an ejection fraction of 15-      20% status post AICD placement.  3. Hypertension.  4. Hyperlipidemia.   HOME MEDICATIONS:  1. Crestor 10 mg p.o. nightly.  2. Aspirin 325 mg daily.  3. Lisinopril 10 mg daily.  4. Coumadin.  5. Carvedilol b.i.d. at an unknown dose.   SOCIAL HISTORY:  She denies any alcohol, tobacco or illicit drug use.  Lives with her son.   FAMILY HISTORY:  Noncontributory in this elderly lady.   REVIEW OF SYSTEMS:  A 10-point review of systems is performed and is  negative except as per HPI.   PHYSICAL EXAMINATION:  VITALS:  Upon admission, blood pressure 112/67,  heart rate 86, respirations 22, O2 sats 92% on room air with a  temperature of 97.7.  GENERAL:  She is an obese white woman, alert, awake, oriented x3  currently in no acute distress.  HEENT:  Normocephalic, atraumatic.  Her pupils are equal and reactive to  light and accommodation with intact extraocular  movements.  NECK:  Thick, supple with no JVD, no lymphadenopathy, no bruits or  goiter.  LUNGS:  Clear to auscultation bilaterally.  HEART:  Regular rate and rhythm with no murmurs, rubs or gallops  auscultated.  ABDOMEN:  Obese, soft, nontender, nondistended with positive bowel  sounds.  EXTREMITIES:  No edema, positive pedal pulses.  NEUROLOGIC:  Intact and nonfocal.   LABS UPON ADMISSION:  Sodium 138, potassium 3.6, chloride 105, bicarb  24, BUN 25, creatinine 1.30, glucose of 127.  WBCs 8.2, hemoglobin 14.4,  platelets of 138, total CK of 209 and a troponin of 0.05.   ASSESSMENT AND PLAN:  1. Wide complex tachycardia.  Documented by EMS strips question of      ventricular tachycardia.  The automatic implantable cardioverter-      defibrillator did not fire.  I have asked [facility] who is      up or unassigned to see her in regards to pacer interrogation and      further recommendations.  2. For hypertension, continue home medications.  3. For her hyperlipidemia, check a fasting lipid profile and continue      her statin.  4. For prophylaxis while in the hospital, place on Protonix and we      will continue her Coumadin.      Peggye Pitt, M.D.  Electronically Signed     EH/MEDQ  D:  02/09/2008  T:  02/09/2008  Job:  [id number]

## 2010-10-13 NOTE — Assessment & Plan Note (Signed)
[facility]                         ELECTROPHYSIOLOGY OFFICE NOTE   NAME:Lubke, ELIZETTE SHEK                     MRN:          [id number]  DATE:12/13/2006                            DOB:          03-14-1922    Ms. Waltrip returns today for followup.  She is a very pleasant elderly  woman with a history of an ischemic cardiomyopathy and congestive heart  failure and left bundle-branch block and nonsustained VT who is status  post ICD implantation.  She returns for followup.  At initial implant  her LV thresholds were increased.  She denies chest pain.  Her dyspnea  is still present but improved from  pre-device implant.  She has had no  intercurrent IC therapies.  Her medications include:  1. Aspirin 325 a day.  2. Lisinopril/hydrochlorothiazide 10/12.5.  3. Coreg 6.25 twice daily.  4. Crestor 10 daily.   Her physical exam is notable for a pleasant, well-appearing elderly  woman in no distress.  The blood pressure was 126/64, the pulse 64 and  regular, the respirations were 18, the weight was 216 pounds.  The neck  revealed 7-cm jugular venous distention.  The lungs were clear  bilaterally to auscultation.  No wheezes, rales, or rhonchi were  appreciated.  The cardiovascular exam revealed a regular rate and rhythm  with normal S1 and S2.  The extremities demonstrated trace peripheral  edema bilaterally.   Interrogation of her defibrillator demonstrates a [employer] Jude device with P  waves of 2 and R waves of 11.  The impedance 510 in the atrium and 500  in the right ventricle, 610 in the left ventricle.  The threshold 0.75  at 0.5 in both right atrium and right ventricle, and 3 at 1 bipolar in  the left ventricle.  Battery voltage 3.2 volts.   IMPRESSION:  1. Ischemic cardiomyopathy.  2. Congestive heart failure.  3. Left bundle-branch block.  4. Status post biventricular ICD insertion.   DISCUSSION:  Overall, Ms. Mula is stable and her  defibrillator is  working normally.  We did make some programming changes today to  maximize her battery longevity.  We will plan to see the patient back in  several months.     Doylene Canning. Ladona Ridgel, MD  Electronically Signed    GWT/MedQ  DD: 12/13/2006  DT: 12/14/2006  Job #: [id number]   cc:   Antionette Char, MD

## 2010-10-13 NOTE — Assessment & Plan Note (Signed)
[facility]                         ELECTROPHYSIOLOGY OFFICE NOTE   NAME:[name], [name]                     MRN:          [id number]  DATE:03/11/2008                            DOB:          1921-09-17    Ms. [name] returns for followup.  She is a very pleasant elderly woman  with a history of ischemic cardiomyopathy, congestive heart failure, and  a bundle-branch block status post BiV ICD insertion over a year and a  half ago.  She presented with this __________, just below her detection  rate and was treated appropriately in the hospital.  She was also found  to be in AFib and had not been pacing as much secondary to her AFib.  The patient was placed on Coumadin and treated with medical therapy.  She returns today for followup.  The patient states that she is feeling  better.  She also during her hospitalization underwent AV node ablation  by Dr. Graciela Husbands.  She does note easy bruisability and she is concerned that  her Coumadin level is too high.   Current medications include:  1. Coumadin as directed.  2. Amiodarone 400 twice a day.  3. Crestor 10 a day.  4. Coreg 6.25 half a tablet twice daily.  5. Lisinopril 10 mg daily.  6. Hydrochlorothiazide 12.5 daily.  7. Aspirin 325 a day.   PHYSICAL EXAMINATION:  GENERAL:  She is a pleasant, well-appearing  elderly woman in no acute distress.  She has a bruise on her lip and a  bruise on her arm.  VITAL SIGNS:  Her blood pressure was 134/80, the pulse 75 and regular,  respirations were 18, the weight was 216 pounds.  NECK:  A 7-cm jugular venous distention and no thyromegaly.  LUNGS:  Clear bilaterally to auscultation.  No wheezes, rales, or  rhonchi are present.  CARDIOVASCULAR:  Regular rate and rhythm.  Normal S1 and a split S2.  There are no obvious murmurs.  ABDOMEN:  Soft, nontender, nondistended.  EXTREMITIES:  Ecchymotic area on the left arm.  She had an ecchymotic  area in her lip.   There were no active bleeding noted.   EKG demonstrates atrial fibrillation with biventricular pacing.   Interrogation of her defibrillator demonstrates a St. Jude Promote  device P-waves (fib waves were 1.6), the R-waves greater than 12, the  impedance 590 in the A and 530 in the V, the threshold at a volt of 0.5  in the A, 2.75 at 1 in the LV.  Battery voltage was 3 V.  Underlying  rhythm was AFib.  She was V paced 99% of the time.  It appeared she had  been in AFib approximately 50% of the time.   IMPRESSION:  1. Ventricular tachycardia.  2. Status post biventricular implantable cardioverter-defibrillator      insertion.  3. Congestive heart failure, now well controlled.  4. Atrial fibrillation.  5. Questionable elevation of Coumadin levels as the patient has      bleeding and has not had her INR checked in several weeks.   DISCUSSION:  We will plan on obtaining a  Coumadin check today and other  labs.  We will call her with the results of her Coumadin levels.  I have  asked that she continue on her current medications except I have asked  her to decrease her amiodarone from 400 twice a day to 400 a day.  Also,  my plan will be to consider cardioversion back to sinus rhythm once we  have got her loaded on amiodarone.      [name]. Ladona Ridgel, MD  Electronically Signed    GWT/MedQ  DD: 03/11/2008  DT: 03/12/2008  Job #: [id number]   cc:   Antionette Char, MD

## 2010-10-13 NOTE — Assessment & Plan Note (Signed)
[facility]                         ELECTROPHYSIOLOGY OFFICE NOTE   NAME:[name], [name]                     MRN:          [id number]  DATE:07/17/2008                            DOB:          [birth year]    [name] returns today for followup.  She is a very pleasant elderly  woman with history of atrial fibrillation and congestive heart failure  and ischemic cardiomyopathy, and complete heart block.  She is status  post upgrade to an ICD back in March 2008.  When I saw her recently, she  was in AFib, still we considered her for DC cardioversion which was  carried out several weeks ago.  She was successfully returned to sinus  rhythm.  Her heart failure symptoms have improved, but she complained  today that since she has been on amiodarone, she has still difficulty  with weakness in her legs.  She notes that she has fallen on one  occasion because of her legs feeling weak.   CURRENT MEDICATIONS:  1. Lisinopril/HCTZ 10/12.5 daily.  2. Crestor 10 a day.  3. Multivitamin.  4. Coumadin as directed.  5. Aspirin 81 a day.  6. Coreg 6.25 twice daily.  7. Amiodarone 200, 2 tablets daily, total of 400 a day.   PHYSICAL EXAMINATION:  GENERAL:  She is a pleasant obese elderly woman  in no acute distress.  VITAL SIGNS:  Blood pressure today was 110/70, pulse was 70 and regular,  respirations were 18, weight was 217 pounds.  NECK:  No jugular venous distention.  LUNGS:  Clear bilaterally to auscultation.  No wheezes, rales, or  rhonchi are present.  No increased work of breathing.  CARDIOVASCULAR:  Regular rate and rhythm.  Normal S1 and S2.  ABDOMEN:  Soft, nontender.  EXTREMITIES:  Demonstrated a trace peripheral edema bilaterally.   Interrogation of her defibrillator demonstrates a St. Jude Promote.  She  had no underlying escape rhythm.  Her P-waves were 2.  Her impedance  were 480 in the atrium, 460 in the RV, 890 in the LV.  Threshold 0.75 at  0.5 in the A1, and 0.5 in the RV, and 3.75 at 1 in the LV.  Battery  voltage was 2.95 volts.  Underlying rhythm with complete heart block.  She was 95% V paced.   IMPRESSION:  1. Complete heart block.  2. Ischemic cardiomyopathy, congestive heart failure.  3. Status post biventricular implantable cardioverter-defibrillator      insertion.  4. Atrial fibrillation, status post direct current cardioversion.   DISCUSSION:  Overall, [name] is stable.  She has had some symptoms  what sound like some neuropathy in her lower extremities.  I have asked  her to decrease her amiodarone from 2 tablets a day to half a tablet  daily.  We will see the patient back in several months.     Doylene Canning. Ladona Ridgel, MD  Electronically Signed    GWT/MedQ  DD: 07/17/2008  DT: 07/18/2008  Job #: [id number]   cc:   Antionette Char, MD

## 2010-10-14 ENCOUNTER — Other Ambulatory Visit: Payer: Self-pay | Admitting: Internal Medicine

## 2010-10-16 NOTE — Op Note (Signed)
NAMEMISAKI, SOZIO              ACCOUNT NO.:  [id number]   MEDICAL RECORD NO.:  [id number]          PATIENT TYPE:  INP   LOCATION:  2037                         FACILITY:  [facility]   PHYSICIAN:  Doylene Canning. Ladona Ridgel, MD    DATE OF BIRTH:  August 29, 1921   DATE OF PROCEDURE:  08/29/2006  DATE OF DISCHARGE:                               OPERATIVE REPORT   PROCEDURE PERFORMED:  Implantation of biventricular implantable  cardioverter-defibrillator with defibrillation threshold testing.   INTRODUCTION:  The patient is a very pleasant 77 year old woman, who was  admitted to hospital with congestive heart failure and ventricular  tachycardia.  She has baseline ischemic cardiomyopathy, EF 15%, and left  bundle branch block.  She is now referred for a bi-V ICD implantation  secondary to all the above.   PROCEDURE:  After informed consent was obtained, the patient was taken  to the diagnostic EP lab in the fasting state.  After the usual  preparation and draping, intravenous fentanyl and midazolam were given  for sedation.  Lidocaine 30 cc was infiltrated into the left  infraclavicular region.  A 7 cm incision was carried out over this  region and electrocautery utilized to dissect down to the fascial plane.  The left subclavian vein was punctured x3 and the St. Jude model 1788T  52-cm active-fixation pacing lead was advanced into the right atrium,  the St. Jude model 7121 65-cm active-fixation pacing lead was advanced  to the right ventricle.  Of note, the RV lead was a defibrillation lead,  serial number [id number].  Initial attempts were placed and the RV lead  was placed on the RV septum up fairly high.  With the lead actively  fixed, there was a nice injury current and the R-waves were 20 mV, the  impedance 583 ohms, the threshold 0.6 V at 0.5 msec, and 10-volt pacing  did not stimulate the diaphragm.  With the RV lead in satisfactory  position, attention was then turned to placement of the atrial  lead.  It  should be noted that the P waves were good-sized throughout but  placement in the and anterior portion the right atrium resulted in  dislodgement of the lead despite active fixation.  Because of this, the  lead was placed in the lateral portion of the right atrium, where P-  waves were 3 and the pacing impedance was 433 ohms with lead actively  fixed, with a threshold of 0.9 V at 0.5 msec.  Ten-volt pacing did not  stimulate the diaphragm.  With both the RV and RA leads in satisfactory  position, they were secured to the subpectoralis fascia and with a silk  suture.  The sewing sleeve was also secured with silk suture.  At this  point the attention then turned to placement of the LV lead, which was  placed with the coronary sinus cannulated without difficulty.  Venography of the coronary sinus was then carried out.  The high lateral  vein was chosen for LV lead placement, which was carried out without  difficulty with the threshold of 2.5 V at 0.5 msec (LV tip  to RV coil).  There 10-volt pacing did not stimulate the diaphragm.  With these  satisfactory parameters the leads were secured to the subpectoralis  fascia with a figure-of-eight silk suture.  The sewing sleeve was also  secured with silk suture.  At this point electrocautery was utilized to  make subcutaneous pocket.  Kanamycin irrigation was utilized to irrigate  the pocket and electrocautery utilized to assure hemostasis.  The St.  Jude Promote RF model [id number] defibrillator was connected to the  defibrillation and pacing leads and placed back in the subcutaneous  pocket.  The generator was secured with silk suture.  The serial number  of the defibrillator was [id number].  With all the above, the pocket was  irrigated with kanamycin irrigation and electrocautery was utilized to  assure hemostasis.  Defibrillation threshold testing was then carried  out.   After the patient was more deeply sedated with fentanyl and  Versed, VF  was induced with a T-wave shock and a 15-joule shock was delivered,  which terminated VF and restored sinus rhythm.  At this point because of  the satisfactory defibrillation margin, no additional DFT testing was  carried out and the incision was closed with a layer of 2-0 Vicryl,  followed by a layer of 3-0 Vicryl, followed by a layer of 4-0 Vicryl.  Benzoin was painted on the skin and Steri-Strips were applied, a  pressure dressing was placed, and the patient was returned to her room  in satisfactory condition.   COMPLICATIONS:  There were no immediate procedure complications.   RESULTS:  This demonstrates successful implantation of a St. Jude bi-V  ICD in a patient with an ischemic cardiomyopathy, congestive heart  failure, left bundle branch, and ventricular tachycardia (symptomatic).      Doylene Canning. Ladona Ridgel, MD  Electronically Signed     GWT/MEDQ  D:  08/29/2006  T:  08/29/2006  Job:  [id number]   cc:   Antionette Char, MD

## 2010-10-16 NOTE — Op Note (Signed)
[name], [name]              ACCOUNT NO.:  [id number]   MEDICAL RECORD NO.:  [id number]          PATIENT TYPE:  INP   LOCATION:  2037                         FACILITY:  [facility]   PHYSICIAN:  Doylene Canning. Ladona Ridgel, MD    DATE OF BIRTH:  [birth year]   DATE OF PROCEDURE:  DATE OF DISCHARGE:                               OPERATIVE REPORT   Audio too short to transcribe (less than 5 seconds)      Doylene Canning. Ladona Ridgel, MD     GWT/MEDQ  D:  08/29/2006  T:  08/29/2006  Job:  [id number]

## 2010-10-16 NOTE — Cardiovascular Report (Signed)
NAMESANAH, KRASKA                        ACCOUNT NO.:  [id number]   MEDICAL RECORD NO.:  [id number]                   PATIENT TYPE:  OBV   LOCATION:  2920                                 FACILITY:  [facility]   PHYSICIAN:  Aram Candela. Tysinger, M.D.              DATE OF BIRTH:  [birth year]   DATE OF PROCEDURE:  10/11/2003  DATE OF DISCHARGE:  10/11/2003                              CARDIAC CATHETERIZATION   PROCEDURES PERFORMED:  1. Left heart catheterization.  2. Coronary cine angiography.  3. Vein graft cine angiography.  4. Left ventricular cine angiography.  5. Angio-Seal of the right femoral artery.   CARDIOLOGIST:  Aram Candela. Aleen Campi, M.D.   INDICATIONS FOR PROCEDURE:  This 77 year old female has a long history of  coronary artery disease status post coronary artery bypass graft surgery  with vein grafts to her right coronary artery and LAD.  She has had unstable  angina and left ventricular dysfunction for several years with stent  placement in her proximal LAD in January 1998.  She had unstable angina  again one month ago and cardiac cath showed restenosis within the bare metal  stent in her proximal LAD.  She had successful reopening with a cutting  balloon and did well clinically after that procedure.   Yesterday she had another episode of exertional chest discomfort and profuse  sweating, and was brought to the emergency room this A.M.  In the hospital  she had another episode of profuse sweating and even though her cardiac  enzymes were normal we felt that she should have repeat cath to appraise her  coronary status and her recent angioplasty status.   DESCRIPTION OF PROCEDURE:  After signing an informed consent the patient was  premedicated with 5 mg Valium by mouth and brought to the cardiac  catheterization lab.  Her right groin was prepped and draped in a sterile  fashion, and anesthetized locally with 1% lidocaine.  A 6 French introducer  sheath was inserted  percutaneously into the right femoral artery.  A 6  French #4 Judkins coronary catheter was make injections in the native  coronary arteries.  The right coronary catheter was used to make injections  into the vein grafts.  The 6 French pigtail catheter was used to measure  pressures in the left ventricle and aorta, and to make a midstream injection  into the left ventricle.   The patient tolerated the procedure well.   COMPLICATIONS:  No complications were noted.   At the end of the procedure the catheters and sheath were removed from the  right femoral artery, and hemostasis was easily obtained with am Angio-Seal  Closure System.   MEDICATIONS GIVEN:  None.   CINE FINDINGS:  Coronary Cine Angiography of the Left Coronary Artery:  Left Main:  The ostium and left main appeared normal.   Left Anterior Descending:  The LAD is totally occluded in the middle segment  after the first septal and diagonal branches.  The proximal segment is the  area of stenting in 1998 and cutting balloon angioplasty on September 12, 2003.  It now has a very good appearance with normal antegrade flow; and, normal  filling and runoff of the fist diagonal and first septal branches.  The mid  and distal LAD are unchanged from the prior study and fill via the vein  graft.   Circumflex Coronary Artery:  The circumflex has several irregular areas;  however, overall, there is good flow and no significant stenotic area.  This  is a large vessel supplying the obtuse marginal and posterolateral  circulation.   Right Coronary Artery:  The right coronary artery has diffuse disease in its  proximal segment with 95% stenosis in three sites, followed with slow  antegrade flow and competitive flow in the middle segment to the acute  angle.  The segment fills by way of vein graft.   Vein Graft Cine Angiography:  The right coronary artery vein graft is a normal-appearing vein graft which  has a distal anastomotic site in  the posterior descending branch.  There is  very good runoff into the distal right coronary artery, which appears  unchanged from her prior study.   The LAD vein graft also is a normal-appearing vein graft with its distal  anastomotic site in the mid LAD.  There is very good runoff into the LAD;  and, this is unchanged from her prior study.   Left Ventricular Cine Angiogram:  Hand injection into the left ventricle shows marked enlargement to the left  ventricle with marked overall left ventricular dysfunction with an ejection  fraction of approximately 15%.  The inferior and apical segments have severe  hypokinesia.  There is one segment of normal contractility in the  anterobasal area.   FINAL IMPRESSION:  1. Very good appearance of the angioplasty site from September 12, 2003 in the     proximal left anterior descending within the bare metal stent.  2. Normal vein grafts to the right coronary artery and left anterior     descending.  3. No change in the coronary status since her angioplasty on September 12, 2003.  4. Severe left ventricular dysfunction with an ejection fraction of 15%;     unchanged from prior study.  5. Successful Angio-Seal of the right femoral artery.   DISPOSITION:  We will be able to discharge her home today because of her  negative enzymes and good appearance of her recent angioplasty site; and, no  change in her coronary status.  We will consider several options on her in  the near future and will see her back in the office next week for further  evaluation.  Things to consider in the near future will be biventricular  pacing and possible ICD.  Other things to consider will be EECP.  We will  also reevaluate her renal arteries with ultrasound.                                               John R. Tysinger, M.D.    JRT/MEDQ  D:  10/11/2003  T:  10/12/2003  Job:  [id number]   cc:   Catheterization Laboratory

## 2010-10-16 NOTE — Op Note (Signed)
[place]  Patient:    [name], [name]                     MRN: [id number] Proc. Date: 10/20/00 Adm. Date:  [id number] Disc. Date: [id number] Attending:  Tommy Medal CC:         Aram Candela. Aleen Campi, M.D.  Freddy Finner, M.D.  Teena Irani. Arlyce Dice, M.D.   Operative Report  PREOPERATIVE DIAGNOSIS:  Blepharochalasis with visual impairment.  POSTOPERATIVE DIAGNOSIS:  Blepharochalasis with visual impairment.  PROCEDURE:  Upper eyelid optical blepharoplasty.  SURGEON:  Robert L. Dione Booze, M.D.  ANESTHESIA:  1% Xylocaine with epinephrine.  INDICATIONS AND JUSTIFICATION FOR THE PROCEDURE:  This patient has been followed in my office since 1997 and has had moderate blepharochalasis.  She also had previous cataract surgery on the left eye and still has a right cataract.  She was seen recently on September 08, 2000, and asked about the redundant skin of her upper eyelids.  She reported that this blocked her with reading and that she actually had to push the eyelids up when she was reading with her fingers.  She felt she could see better when the skin was lifted. Examination did show that she had significant blepharochalasis and the skin covered the lashes and almost came to each pupil.  Photographs were taken, and visual fields were performed to document the loss of superior field and to show what the problem was.  She did decide she did want to have upper eyelid optical blepharoplasties because of the problem she had regarding the superior field of vision.  Otherwise, the eye examination showed that the pupils, motility, conjunctiva, cornea, anterior chamber, and dilated fundus exam was unremarkable except she does have an old retinal detachment on the left.  She has the lens implant in the left and has an early right cataract.  She does take Timoptic for glaucoma, and this is well-controlled.  The pressure was 19 in the right and 14 in the left on  April 11.  Medically she should be stable with respect to having a simple local upper eyelid blepharoplasty.  She is followed medically by Aram Candela. Aleen Campi, M.D., Freddy Finner, M.D., and by Teena Irani. Arlyce Dice, M.D.  JUSTIFICATION FOR PERFORMING PROCEDURE IN OUTPATIENT SETTING:  Routine.  JUSTIFICATION FOR OVERNIGHT STAY: None.  DESCRIPTION OF PROCEDURE:  The patient arrived in the minor surgery room at [facility] and was prepped and draped in the routine fashion.  A frontal nerve block was given to each upper eyelid, and the skin to be excised was carefully demarcated.  After this, the skin was excised using a scalpel and forceps and scissors.  The underlying fatty tissue was also excised. Bleeding was controlled with pressure.  Next, each wound was closed with a running 6-0 nylon suture, and the eyes were patched after Polysporin ointment was used.  She then left the minor surgery room, having done nicely.  FOLLOW-UP CARE:  Patient to remove the patches in several hours and to use warm compresses.  She is to use Polysporin ointment inside her eyes at night. She is to be seen in my office in five days to have the sutures removed. DD:  10/20/00 TD:  10/20/00 Job: [id number]

## 2010-10-16 NOTE — Cardiovascular Report (Signed)
[name], [name]                        ACCOUNT NO.:  [id number]   MEDICAL RECORD NO.:  [id number]                   PATIENT TYPE:  OIB   LOCATION:  6525                                 FACILITY:  [facility]   PHYSICIAN:  John R. Tysinger, M.D.              DATE OF BIRTH:  08/20/1921   DATE OF PROCEDURE:  09/12/2003  DATE OF DISCHARGE:  09/13/2003                              CARDIAC CATHETERIZATION   PROCEDURES:  1. Left heart catheterization.  2. Coronary cineangiography.  3. Vein graft cineangiography.  4. Left ventricular cineangiography.  5. Angioplasty with cutting balloon of the proximal left anterior descending     coronary artery within the stent.  6. Angio-Seal of the right femoral artery.   INDICATION FOR PROCEDURES:  This 77 year old female has a history of  coronary artery disease with an acute myocardial infarction in 1984.  She  then did fairly well until 1995 when she became unstable and was noted to  have two vessel coronary artery disease and underwent coronary artery bypass  surgery with vein grafts to her LAD and right coronary artery.  She was  unable again in 1998 and cardiac catheterization revealed a critical  stenosis in her proximal LAD which supplied a large anterolateral branch and  a large septal branch.  Her LAD was totally occluded distal to this segment  and the mid and distal segments filled by way of vein graft.  She underwent  successful angioplasty with a bare metal stent placed in her proximal LAD to  supply the first diagonal and first septal branches.  She has done well  overall over the last seven years, however in the last year she has become  progressively weaker and chest x-ray showed an increased heart size and a  Persantine Cardiolite study done earlier in the week showed anterolateral  ischemia with reversible changes in that distribution.  She was then  scheduled for cardiac cath.   PROCEDURE:  After signing an informed consent,  the patient was premedicated  with 5 mg of Valium by mouth and brought to the cardiac catheterization lab.  Her right groin was prepped and draped in sterile fashion and anesthetized  locally with 1% lidocaine.  A 6 French introducer sheath was inserted  percutaneously into the right femoral artery.  The 6 [place] #4 Judkins  coronary catheters were used to make injections into the native coronary  arteries.  The right coronary catheter was used to make injections into both  vein grafts with good visualization.  A 6 French pigtail catheter was used  to measure pressures in the left ventricle and aorta and to make midstream  injections into the left ventricle.  After noting critical restenosis within  the proximal LAD stent that was placed in 1998, we reviewed the cines with  Dr. Chales Abrahams and he recommended angioplasty within the proximal LAD stent,  principally with a cutting balloon.  We then selected  a 6 [place] guide  catheter and a short HTF guide wire.  After engaging the tip of the guide  catheter in the ostium of the left coronary artery, the guide wire was  advanced into the LAD.  We encountered moderate difficulty passing the guide  wire through that highly irregular area within the proximal LAD stent,  however we were able to pass through the stent and position the wire in the  septal perforator.  We then selected at 3.0 x 15 mm cutting balloon which  was advanced over the guide wire.  We were unable to advance the cutting  balloon within the lesion probably because of its density and severity.  We  then withdrew the cutting balloon and selected at 2.5 x 15 mm Maverick  balloon catheter which was advanced over the guide wire and into the  proximal LAD lesion.  One inflation was made at 8 atmospheres for 24  seconds.  We then reinserted the same cutting balloon and after moderate  difficulty we were able to then advance it into the proximal LAD lesion.  Three inflations were made,  the first at 8 atmospheres for 24 seconds, the  second at 10 atmospheres for 21 seconds and the third at 10 atmospheres for  19 seconds.  After this final inflation the cutting balloon was removed and  injection again into the left coronary artery showed a significant  improvement, however moderate irregularity was still present.  We then chose  a 3.0 x 15 mm Quantum ballon catheter which after proper preparation was  inserted over the guide wire and into the proximal LAD lesion.  One  inflation was made at 12 atmospheres for 120 seconds.  After this final  inflation, the balloon was removed and an injection again into the left  coronary artery showed a good angiographic result but with still mild  irregularity at its periphery.  We felt that this represented a very good  angiographic result as there was a marked improvement in antegrade blood  flow, now with normal telemetry flow.  The patient tolerated the procedure  well and no complications were noted.  At the end of the procedure the  catheter and sheath were removed from the right femoral artery and  hemostasis was easily obtained.  She was then held in the holding area prior  to being transferred to the EAD for monitoring over night.  Will continue  the Integrilin for 12 hours and anticipate discharge in a.m.   CINE FINDINGS:   CORONARY CINEANGIOGRAPHY:  1. The ostium and left main appear normal.  2. Left anterior descending.  The proximal segment has a 15 mm stent placed     in 1998 and within the stent it is highly irregular with marked narrowing     in several areas of at least 90%.  There was slow TIMI-2 antegrade flow.     The LAD is totally occluded just distal to the stent.  However, at the     distal end of the stent there is a takeoff of a large anterolateral     branch and a large septal branch.  The mid and distal LAD appear normal     and fill by way of vein graft. 3. Circumflex coronary artery.  The circumflex is a  fairly large vessel.     The proximal segment is mildly irregular with mild irregular plaque     throughout the proximal and middle segments.  The distal segment  is a     large obtuse marginal system which essentially divides into four equally     divided obtuse marginal branches.  In the second of these four, there is     a 50% stenosis in the proximal segment.  The remainder of the obtuse     marginal system appeared normal distally.  4. Right coronary artery.  The right coronary artery is an average sized     artery supplying the posterior descending and some posterolateral     circulation.  The ostium appears normal.  There is severe disease     proximally with 95% stenosis proximally and total occlusion in the middle     segment.  There is a large right ventricular branch supplied antegrade     which is at risk because of two focal 95% stenotic lesions in the     proximal and mid right coronary artery.  The mid and distal segments of     the right coronary artery fill by way of vein graft and have minor     irregularities throughout the distal segment.  It also shows the total     occluded area, which is, in actuality, a short segment.   VEIN GRAFT CINEANGIOGRAPHY:  1. Right coronary artery vein graft.  This vein graft appears normal and     supplies the distal right coronary artery system.  The distal right     coronary artery has minor irregularities throughout without significant     stenosis.  2. Vein graft to the LAD.  This vein graft is normal in appearance and has     very good flow.  The distal anastomotic site in the mid LAD is normal.     Proximally, the totally occluded segment is visualized which is distal to     the original stent.  There is a critical stenosis in the proximal small     segment supplied by the vein graft.  The left coronary artery otherwise     is a moderate-sized vessel and has only mild irregularities distally.   LEFT VENTRICULAR CINEANGIOGRAM:  The  left ventricular chamber size is  enlarged with moderate to severe left ventricular function.  There is a  global hypokinesia.  The ejection fraction was estimated at 15%.   ANGIOPLASTY CINES:  Cines taken during the angioplasty procedure showed  proper placement of the guide wire and balloon catheters with good balloon  form obtained.  Final injections into the left coronary artery show a good  angiographic result with mild residual irregularity on the perimeter of the  vessel.  There is no evidence for clot or decreased flow.  There was an  increased flow after the angioplasty with TIMI-3 antegrade flow.   FINAL DIAGNOSES:  1. Restenosis within the proximal left anterior descending stent.  2. Three vessel coronary artery disease with chronic total occlusion of the     mid left anterior descending and mid right coronary artery.  3. Normal vein grafts to the left anterior descending and right coronary     artery. 4. Severe dilated cardiomyopathy, ejection fraction 15%.  5. Successful angioplasty of the proximal left anterior descending with     cutting balloon.  6. Successful Angio-Seal of the right femoral artery.   DISPOSITION:  1. Will monitor on the EAD over night and continue the Integrilin drip for     10 hours.  2. Will hydrate and start Plavix.  3. Hopefully she will be stable  for discharge in a.m.                                               John R. Tysinger, M.D.    JRT/MEDQ  D:  09/12/2003  T:  09/15/2003  Job:  [id number]   cc:   Cardiac Cath Lab [facility])

## 2010-10-16 NOTE — H&P (Signed)
NAMEJACALYNN, [name]                        ACCOUNT NO.:  [id number]   MEDICAL RECORD NO.:  [id number]                   PATIENT TYPE:  OBV   LOCATION:  1823                                 FACILITY:  [facility]   PHYSICIAN:  Quita Skye. Waldon Reining, MD             DATE OF BIRTH:  05/30/1922   DATE OF ADMISSION:  10/10/2003  DATE OF DISCHARGE:                                HISTORY & PHYSICAL   IDENTIFYING DATA AND CHIEF COMPLAINT:  [name] is a 77 year old white  woman who is admitted to [facility] for further evaluation of chest  pain.   HISTORY AND PHYSICAL:  The patient has a history of coronary artery disease,  which dates back to 59.  At that time she suffered an acute myocardial  infarction.  She did well until 1995 when presented with unstable angina and  was noted to have two-vessel coronary artery disease.  She underwent  coronary artery bypass surgery, and received a saphenous vein graft to her  LAD and a saphenous vein graft to the right coronary artery.  She developed  unstable angina again in 1998 and cardiac catheterization revealed a  critical stenosis in her proximal LAD.  The LAD supplied a large  anterolateral branch and a large septal branch.  The LAD was totally  occluded distal to this segment, and the mid and distal segments filled by  way of vein grafts.  She underwent PTCA with deployment of a bare metal  stent in her proximal LAD to supply the first diagonal and first septal  branches.  She did well until last month when a Persantine Cardiolite study  demonstrated evidence of anterolateral ischemia.   On April 14th of this year she underwent repeat cardiac catheterization,  which demonstrated restenosis within the proximal LAD stent.  Three-vessel  coronary artery disease was noted with total chronic occlusion of the mid  LAD and mid right coronary artery.  Vein grafts to the LAD and right  coronary artery were patent.  The left ventricle was  severely dilated with  an ejection fraction of 15%.  She the underwent successful cutting balloon  angioplasty of the proximal LAD.   The patient presented to the emergency department this evening with a one-  day history of waxing and waning chest pain.  This was described as a  substernal fullness.  It did not radiate.  It has been associated with  dyspnea, diaphoresis and nausea.  There have been no exacerbating or  ameliorating  factors.  It appears not to be related to position, activity,  meals or respirations.  She has not taken any nitroglycerin.  She believes  that this pain is similar to her prior cardiac chest pain.  She is free of  chest pain at this time.  There is no history of arrhythmia.   The patient also has a history of dyslipidemia.   MEDICATIONS:  The patient's current  medications include:  1. Plavix 75 mg p.o. daily,  2. Heparin 325 mg p.o. daily.  3. Zocor 40 mg p.o. daily.   SOCIAL HISTORY:  The patient is widowed.  She lives alone.  She does not  work.  She does not smoke.   PAST SURGICAL HISTORY:  Previous operations include:  1. Herniorrhaphy.  2. Cholecystectomy.  3. Hysterectomy.  4. The coronary artery bypass surgery as described above.   REVIEW OF SYSTEMS:  Review of systems reveals no problems related to her  head, eyes, ears, nose, mouth, throat, lungs, gastrointestinal system,  genitourinary system, or extremities.  There is no history of neurologic or  psychiatric disorder.  There is no history of fever, chills or weight loss.   PHYSICAL EXAMINATION:  VITAL SIGNS:  Blood pressure 114/57, pulse 107 and  regular, respirations 16, and temperature 97.7.  GENERAL APPEARANCE:  The patient is an elderly white woman in no discomfort.  She is alert, oriented, appropriate and responsive.  HEENT:  Head, eyes, nose and mouth are normal.  NECK:  The neck is without thyromegaly or adenopathy.  Carotid pulses are  palpable bilaterally and without bruits.   HEART:  Cardiac examination reveals a normal S1 and S2.  There is no S3, S4,  murmur, rub, or click.  Cardiac rhythm is irregularly irregular.  CHEST:  No chest wall tenderness is noted.  LUNGS:  The lungs are clear.  ABDOMEN:  The abdomen is soft and nontender.  There is no mass,  hepatosplenomegaly, bruit, distention, rebound, guarding, or rigidity.  Bowel sounds are normal.  PELVIC, RECTAL AND GENITALIA:  Pelvic, rectal and genital examinations are  not performed as they are not pertinent to the reason for acute care  hospitalization.  EXTREMITIES:  The extremities are without edema, deviation or deformity.  Radial and dorsalis pedal pulses are palpable bilateral.  NEUROLOGIC EXAMINATION:  Brief screening neurologic survey is unremarkable.   LABORATORY DATA:  The electrocardiogram revealed atrial fibrillation with a  ventricular rate of 101 beats/minute, and left bundle branch block was  present.  The chest radiograph is pending at the time of this dictation.  The initial set of cardiac markers revealed a myoglobin of 89.5, CK MB 1.4  and troponin less than 0.05.  The second set revealed a myoglobin of 75.4,  CK MB 1.5 and troponin less than 0.05.  Potassium 4.3, BUN 29 and creatinine  1.2.  The remainder studies are pending at the time of this dictation.   IMPRESSION:  1. Chest pain, rule out unstable angina.  2. New onset atrial fibrillation.  3. Coronary artery disease status post myocardial infarction in 1984, status     post coronary artery bypass surgery in 1995 with a saphenous vein graft     to the left anterior descending and a saphenous vein graft tot he right     coronary artery; status post left anterior descending stent in 1998; and,     one month status post cutting balloon percutaneous transluminal coronary     angioplasty and stent to the left anterior descending.  4. Ischemic cardiomyopathy; ejection fraction 15%.  5. Dyslipidemia.   PLAN:  1. Telemetry. 2.  Serial cardiac enzymes.  3. Aspirin.  4. Heparin.  5. Intravenous nitroglycerin.  6. Plavix.  7. Thyroid function tests.  8. Diltiazem IV if needed for ventricular rate control.  9. Further measures per Dr. Aleen Campi.   Of note, the patient's grandson was present throughout the patient  encounter.  Quita Skye. Waldon Reining, MD    MSC/MEDQ  D:  10/11/2003  T:  10/11/2003  Job:  [id number]

## 2010-10-16 NOTE — Cardiovascular Report (Signed)
[name], [name]              ACCOUNT NO.:  [id number]   MEDICAL RECORD NO.:  [id number]          PATIENT TYPE:  INP   LOCATION:  2102                         FACILITY:  [facility]   PHYSICIAN:  Madaline Savage, M.D.DATE OF BIRTH:  1922/03/11   DATE OF PROCEDURE:  08/26/2006  DATE OF DISCHARGE:                            CARDIAC CATHETERIZATION   PROCEDURES PERFORMED:  1. Combined left heart catheterization with native coronary      angiography, retrograde left heart catheterization and left      ventricular angiography.  2. Percutaneous coronary artery stenting of the second obtuse marginal      branch of the circumflex after percutaneous coronary balloon      angioplasty.   COMPLICATIONS:  None.   ENTRY SITE:  Right femoral artery.   MEDICATIONS GIVEN:  Integrilin and heparin.  ACT was noted to be  approximately 280 seconds.   PATIENT PROFILE:  [name] is a delightful, mentally spry, 77-year-  old woman who enters the hospital with unstable angina and is known to  have an ischemic cardiomyopathy.  She underwent coronary bypass grafting  in 1995.  At the time that Dr. Allyson Sabal admitted the patient with chest  pain, she was also having runs of ventricular tachycardia.  She entered  the catheterization lab today stable without runs of ventricular  tachycardia with stable vital signs.  Catheterization was completed and  ultimately, she required percutaneous coronary stenting to a new lesion  in her coronary anatomy.  This went well and there were no  complications.  Results are described below.   PRESSURES:  The left ventricular pressure was 130/22, end-diastolic  pressure 30, central aortic pressure 130/70 with a mean of 95.  No  aortic valve gradient by pullback technique.   ANGIOGRAPHIC RESULTS:  The patient's left main coronary artery is large  and is patent.  The left anterior descending coronary artery contains a  proximal stent that has in-stent restenosis over the  course of the stent  and just after the stenotic portion of the stent, the vessel becomes  100%occluded.  There is flow into a tiny diagonal and into a septal.  No  other antegrade flow is seen into the LAD.   Left circumflex coronary artery contains only one lesion.  It is a large  circumflex with an obtuse marginal branch that trifurcates and midway  down the second obtuse marginal branches, a new stenosis of 75% that is  eccentric.  The remainder of that large circumflex is unremarkable.  This circumflex stenosis midway down the second OM was felt to be the  culprit vessel that could be contributing to her ventricular tachycardia  and that will be described more later.   The right coronary artery is severely diseased and contains stenoses of  90% in the midportion of the vessel followed by a second area of 99%  stenosis before this RCA bifurcates.  It should be noted that there is  brisk collateral flow from a patent saphenous vein graft inserted into  the posterior descending sequentially into the posterolateral branch.  There is good distal runoff in  both of these areas.   This saphenous vein graft is widely patent.   The saphenous vein graft to LAD inserts midportion of the LAD beyond  second septal perforator branch and neck vein graft looks pristine.  There is flow distally in the LAD and also flow retrograde in the LAD  almost to the point of 100% occlusion of the LAD stent previously  mentioned.   Left ventricular angiography shows markedly dilated LV with diffuse  hypokinesis and ejection fraction estimate of 10-15%.  There is extreme  dilatation and there is moderate mitral regurgitation.  No obvious LV  thrombus is seen.  That is difficult to rule out, however.   Percutaneous coronary artery intervention was accomplished using a 6-  French catheter system and a left Cordis extra backup guide catheter  used along with a Prowater wire which crossed the lesion fairly  easily.  I then tried to directly cross the lesion with an Endeavor stent and  fell into inability to cross the lesion, so I removed it, predilated  with a Maverick balloon and then was able to cross the lesion and  deployed the Endeavor stent midway down the second obtuse marginal  branch.  I deployed the Endeavor stent to 16 atmospheres of pressure and  the vessel showed resolution of the 75-80% stenosis to 0% residual with  good runoff.   FINAL DIAGNOSES:  1. Successful deployment of an Endeavor stent into the mid circumflex      obtuse marginal branch, reducing an 80% lesion to 0% residual.  2. Patent saphenous vein grafts to right coronary artery and left      anterior descending.  3. Ischemic cardiomyopathy with ejection fraction 10-15%.  4. Severe native coronary artery disease including an old stent to the      left anterior descending that is 100% occluded.   PLAN:  The patient will be treated medically and will require Plavix for  her Endeavor stent.           ______________________________  Madaline Savage, M.D.     WHG/MEDQ  D:  08/26/2006  T:  08/27/2006  Job:  [id number]   cc:   Antionette Char, MD

## 2010-10-16 NOTE — Consult Note (Signed)
[name], [name]              ACCOUNT NO.:  [id number]   MEDICAL RECORD NO.:  [id number]          PATIENT TYPE:  INP   LOCATION:  1825                         FACILITY:  [facility]   PHYSICIAN:  Doylene Canning. Ladona Ridgel, MD    DATE OF BIRTH:  1921/10/02   DATE OF CONSULTATION:  08/26/2006  DATE OF DISCHARGE:                                 CONSULTATION   CONSULTATION REQUESTED BY:  Dr. Aggie Cosier.   INDICATION FOR CONSULTATION:  Evaluation of repetitive monomorphic VT.   HISTORY OF PRESENT ILLNESS:  The patient is an 77 year old woman with  longstanding ischemic cardiomyopathy and EF of 15% for several years by  echo.  She has congestive heart failure.  She has a history of  catheterization, most recently in 2005.  She has extensive coronary  disease.  The patient has congestive heart failure which varies from  class II to class III.  She has left bundle branch block at baseline.  The patient was in her usual state of health, which is typically class  II to III heart failure, until the day of admission, when she developed  substernal chest pain, shoulder pain and was taken to the emergency  department, where she was found to have recurrent long runs of  repetitive monomorphic ventricular tachycardia.  Cardiac enzymes  demonstrated an elevation in her troponin of 0.7.  The patient was  placed on IV amiodarone.  During her tachycardia, the axis would change  on top of her left bundle branch block, confirming the diagnosis of VT.  On amiodarone, she has had no recurrence of her arrhythmias of any  significance.  She denies syncope.   ADDITIONAL PAST MEDICAL HISTORY:  1. Notable for apparent history of atrial fibrillation.  2. She has a history of hysterectomy.  3. She has a history of cholecystectomy.  4. She has a history of hypertension.  5. Bypass surgery in 1998.  6. She has had multiple angioplasties and stents in the last 20 years.   SOCIAL HISTORY:  The patient denies tobacco or  ethanol abuse.   REVIEW OF SYSTEMS:  Notable for dyspnea with exertion and generalized  fatigue.  She has rare peripheral edema, particularly when she has been  up for long periods of time.   FAMILY HISTORY:  Noncontributory at her advanced age.   PHYSICAL EXAMINATION:  GENERAL:  She is a pleasant, obese elderly woman  in no acute distress.  VITAL SIGNS:  She has a blood pressure of 130/80.  The pulse was 70 and  regular. The respirations were 28.  The temperature was 98.  HEENT:  Normocephalic and atraumatic.  Pupils are equal and round.  The  oropharynx is moist.  The sclerae are anicteric.  NECK:  Revealed no obvious jugular distention, although it was difficult  to assess with her obesity:  LUNGS:  Revealed rales in the bases, but no wheezes or rhonchi were  present.  CARDIOVASCULAR:  Distant with a regular rate and rhythm, normal S1 and  S2.  The PMI could not be appreciated.  I did not appreciate any  murmurs.  ABDOMEN:  Obese, nontender and nondistended.  There was no organomegaly.  The bowel sounds were present.  No rebound or guarding.  EXTREMITIES:  Demonstrated no cyanosis, clubbing or edema.  The pulses  were 2+ symmetric.  There were well-healed scars in her lower  extremities from her bypass surgery.  NEUROLOGIC:  Alert and oriented x3 with cranial nerves intact.  The  strength was 5/5 and symmetric.   ACCESSORY CLINICAL DATA:  The EKG demonstrates sinus rhythm with left  bundle branch block.  Additional EKGs demonstrate sinus rhythm with runs  of repetitive monomorphic VT.   IMPRESSION:  1. Unstable angina with borderline troponins.  2. Ventricular tachycardia.  3. Ischemic cardiomyopathy, ejection fraction 15%, left bundle branch      block.  4. Paroxysmal atrial fibrillation.   DISCUSSION:  I agree with plans for proceeding with a catheterization.  The patient's ischemic cardiomyopathy, congestive heart failure, VT and  left bundle branch block all suggest  that she would be benefited by Bi-V  ICD implantation.  I have discussed the risks, benefits, goals and  expectations of this procedure with the patient.  We will plan on  following her with you and base additional treatment recommendations on  the results of her catheterization.  If she had critical coronary  ischemia and required revascularization, then I would recommend invasive  EP study prior to discharge from the hospital to help with risk  stratification.  Also she will, with her history of paroxysmal atrial  fibrillation, require Coumadin chronically, unless she is felt not to be  a candidate.      Doylene Canning. Ladona Ridgel, MD  Electronically Signed     GWT/MEDQ  D:  08/26/2006  T:  08/26/2006  Job:  [id number]   cc:   Jaclyn Prime. Lucas Mallow, M.D.  Antionette Char, MD  Audrie Gallus. Milinda Antis, MD

## 2010-10-16 NOTE — Discharge Summary (Signed)
[name], [name]              ACCOUNT NO.:  [id number]   MEDICAL RECORD NO.:  [id number]          PATIENT TYPE:  INP   LOCATION:  2037                         FACILITY:  [facility]   PHYSICIAN:  Antionette Char, MD    DATE OF BIRTH:  1921-09-07   DATE OF ADMISSION:  08/25/2006  DATE OF DISCHARGE:  08/30/2006                               DISCHARGE SUMMARY   FINAL DIAGNOSES:  1. Unstable angina.  2. Coronary artery disease.  3. Chronic systolic heart failure.  4. History of ventricular tachycardia.  5. Left bundle branch block.  6. Mitral insufficiency, moderate.   REASON FOR ADMISSION:  This 77 year old female was admitted by Dr.  Nanetta Batty on August 25, 2006, after the onset of anterior chest pain  and marked weakness and sweating.  She was admitted for monitoring and  serial enzymes and EKGs.   HOSPITAL COURSE:  After admission, the patient was monitored and her  rhythm remained stable.  Her cardiac enzymes were positive for  myocardial ischemia, and therefore went for a cardiac cath on  08/26/2006, finding an overt lesions in her circumflex which was  successfully opened and stented by Dr. Lavonne Chick.  Following this, her  history of systolic left ventricular heart failure and poor ejection  fraction was reviewed along with her history of the possibility of  having ventricular tachycardia in the past.  She had a consult by  electrophysiology who recommended a biventricular ICD inplant.  After  documenting indications for this procedure, she underwent implantation  on March31,2008, of a Bi-V ICD by Dr. Ladona Ridgel.  She is now postop from  the ICD implant and stable..  Dr. Ladona Ridgel has reviewed her wound care  instructions, her diet and activity level, and also has arranged for  follow up at the ICD clinic on Thursday April17.  He has also arranged  for follow-up with himself on July15.  He has approved her discharge  from the standpoint of her Bi-V ICD and we concur with her  being stable  and able to be discharged today in improved, stable condition.   DISCHARGE DISPOSITION:   MEDICATIONS:  1. Aspirin 325 mg daily.  2. Lisinopril/hydrochlorothiazide 10/12.5 mg daily.  3. Crestor 10 mg daily.  4. Coreg 6.25 mg b.i.d.  5. Centrum Silver daily.   DISCHARGE INSTRUCTIONS:  1. Diet:  Low sodium heart healthy.  2. Activity:  Slowly increase activity over the next two weeks.  No      heavy lifting for 4-6 weeks and no driving for one week.  3. Wound Care:  Keep the wound incision dry for the next week.   FOLLOWUP:  Followup appointment in ICD clinic Thursday, April17, with  Dr. Ladona Ridgel on [phone] and with Dr. Aleen Campi in two months.  She was given  a number to call for this appointment   CONDITION ON DISCHARGE:  Improved and stable.      Antionette Char, MD  Electronically Signed     JRT/MEDQ  D:  08/30/2006  T:  08/30/2006  Job:  [id number]   cc:   Madaline Savage,  M.D.  Doylene Canning. Ladona Ridgel, MD

## 2010-11-26 ENCOUNTER — Other Ambulatory Visit: Payer: Self-pay | Admitting: *Deleted

## 2010-11-26 MED ORDER — LISINOPRIL-HYDROCHLOROTHIAZIDE 10-12.5 MG PO TABS: 1.0000 | ORAL_TABLET | Freq: Every day | ORAL | Status: DC

## 2010-12-05 ENCOUNTER — Other Ambulatory Visit: Payer: Self-pay | Admitting: Internal Medicine

## 2010-12-05 IMAGING — CT CT PELVIS W/O CM
2 of 4 series · 13 of 32 positions shown, 18 images · non-contrast
Comparison: None

CT ABDOMEN

CLINICAL DATA: Mid back pain for 4 days.  Pain worsening today.
Question ruptured abdominal aortic aneurysm.  History of CAD, CABG,
pacemaker, defibrillator, hypertension.  Status post hysterectomy,
cholecystectomy, hernia repair.

CT ABDOMEN AND PELVIS WITHOUT CONTRAST
TECHNIQUE: Multidetector CT imaging of the abdomen and pelvis was
performed following the standard protocol without intravenous
contrast.

[Series 2: abd/pelvis w/o · axial · non-contrast · 0.98mm/px · z∈[-439,-144]mm · 5 of 89 slices shown, 10 images]
[im 15/89  soft-tissue]
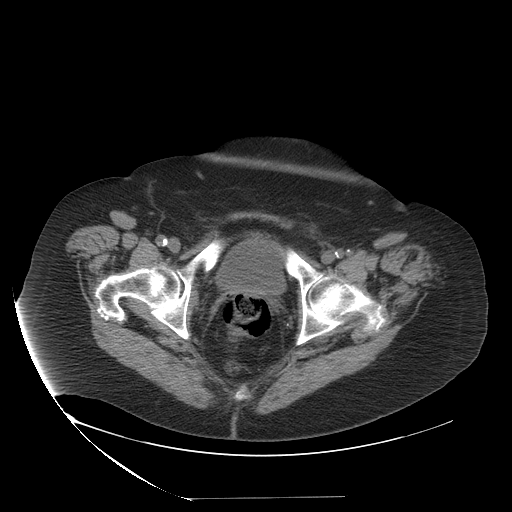
[im 15/89  bone]
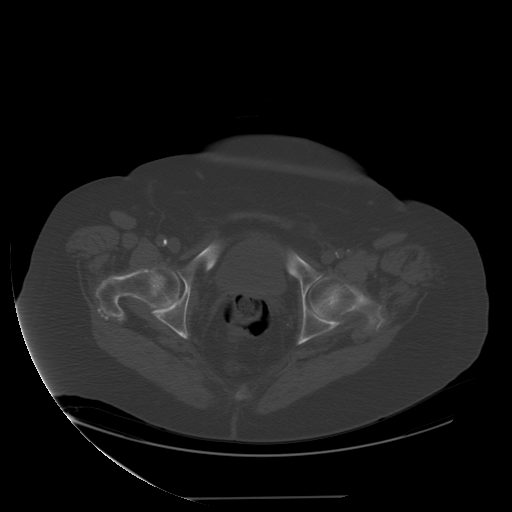
[im 30/89  soft-tissue]
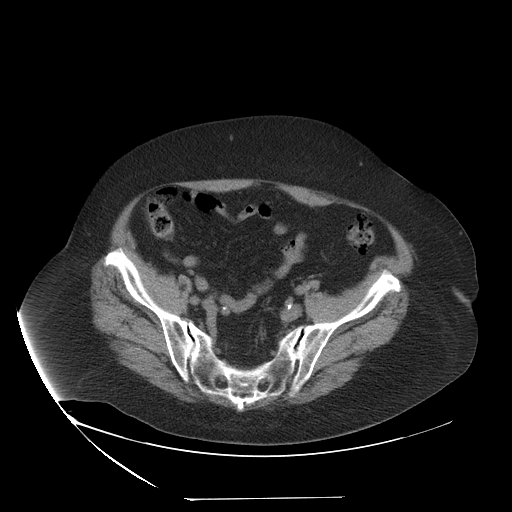
[im 30/89  lung]
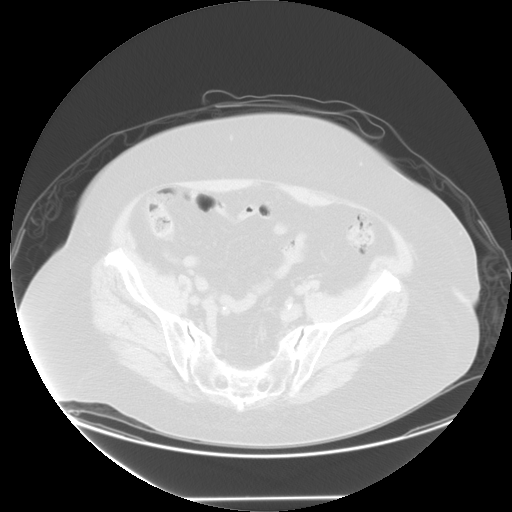
[im 45/89  soft-tissue]
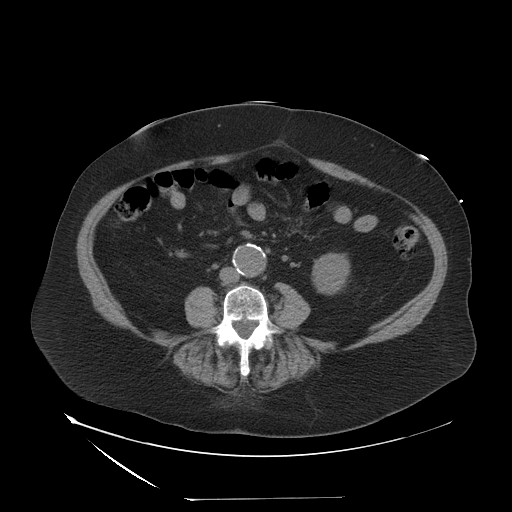
[im 45/89  lung]
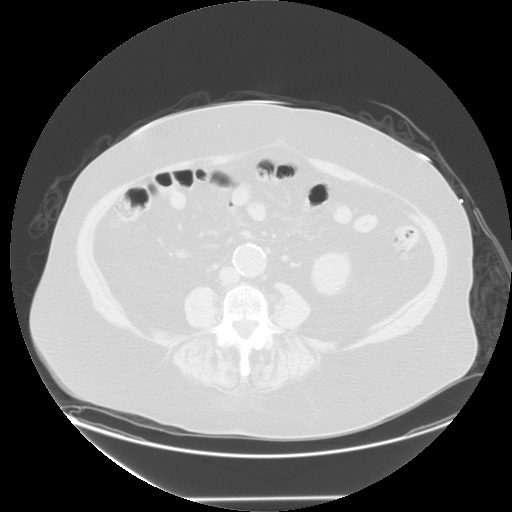
[im 59/89  soft-tissue]
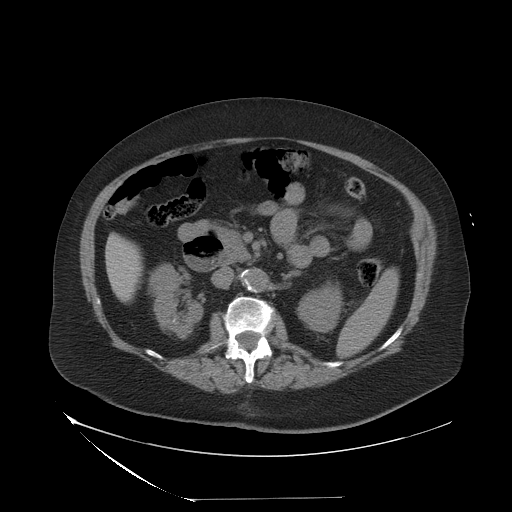
[im 59/89  lung]
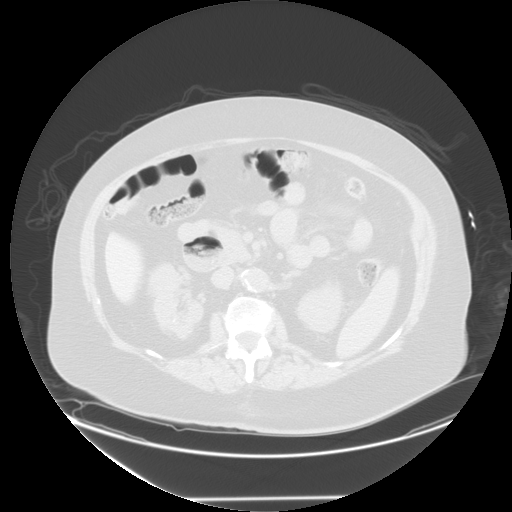
[im 74/89  soft-tissue]
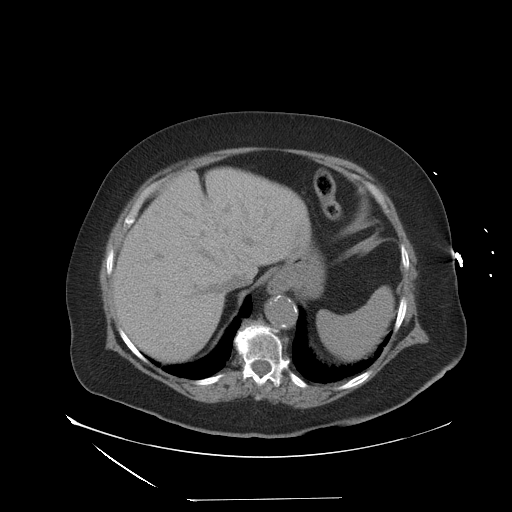
[im 74/89  lung]
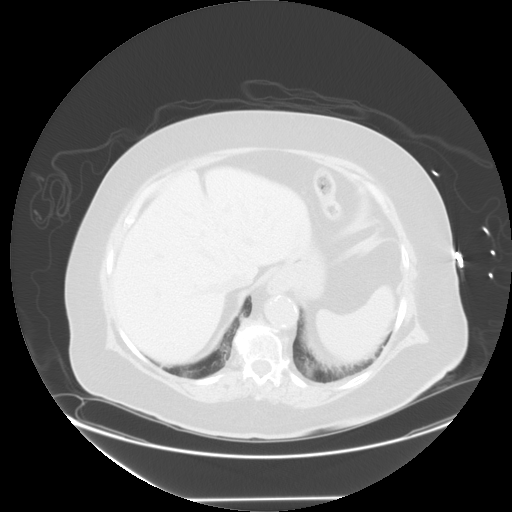

[Series 402: sag a/p · sagittal · 0.90mm/px · 8 of 152 slices shown]
[im 13/152  soft-tissue]
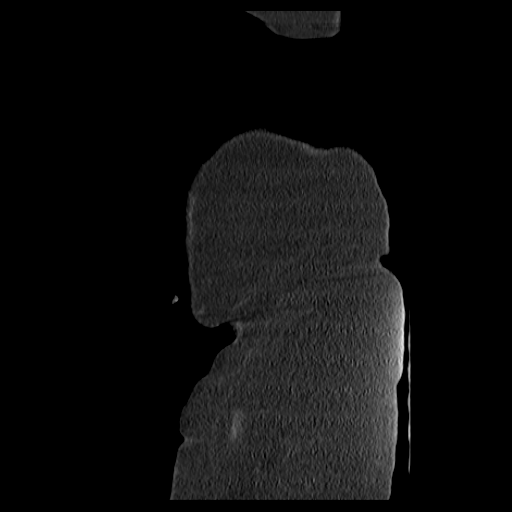
[im 38/152  soft-tissue]
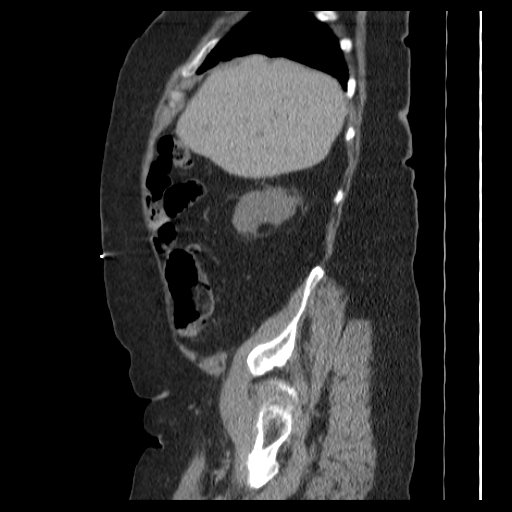
[im 51/152  soft-tissue]
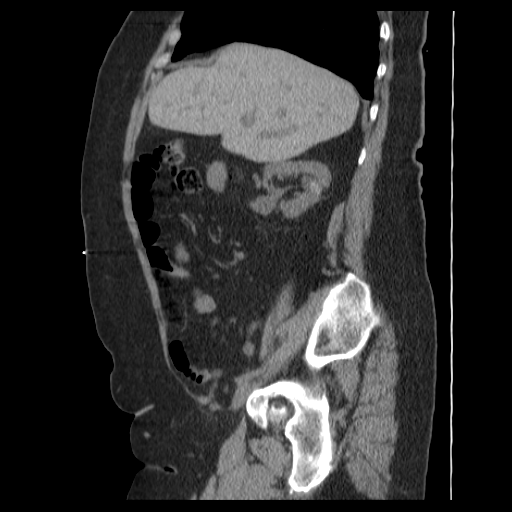
[im 63/152  soft-tissue]
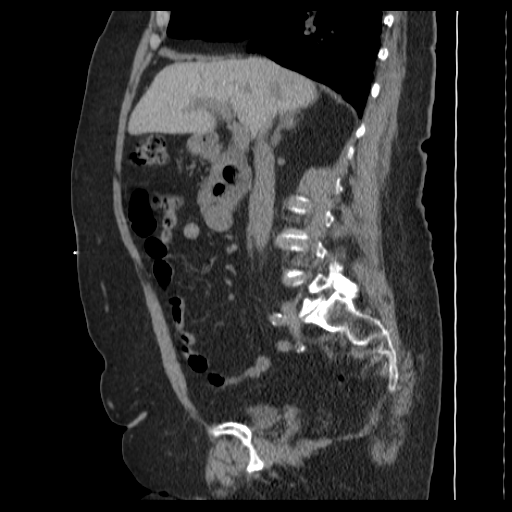
[im 89/152  soft-tissue]
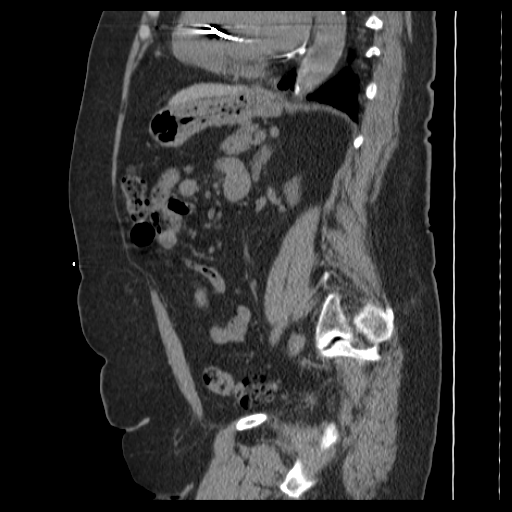
[im 101/152  soft-tissue]
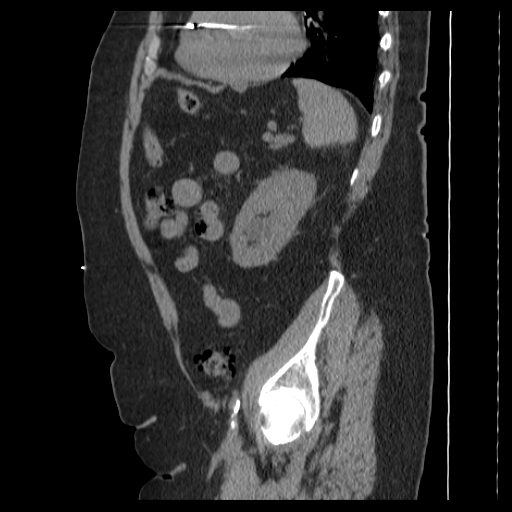
[im 114/152  soft-tissue]
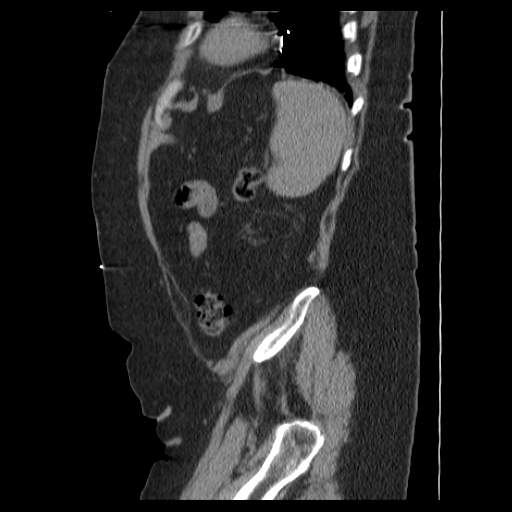
[im 139/152  soft-tissue]
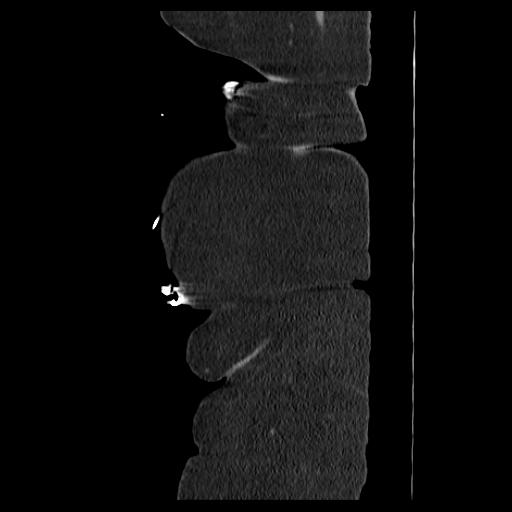

[13 of 32 positions shown; findings below may reference images not displayed]

FINDINGS: Images of the lung bases show atelectasis or scar.
Patient has had a median sternotomy and CABG.  Heart is enlarged
but there is no evidence for edema in the lung bases.

No focal abnormalities identified within the liver, spleen,
pancreas, adrenal glands.  There are probable bilateral renal
parapelvic cysts.  Small probable hyperdense cyst is seen in the
mid pole region of the right kidney measuring 5 mm in diameter.  No
intra renal or ureteral calculi identified.  Patient has had
cholecystectomy.  There is no retroperitoneal adenopathy or
ascites.

The abdominal aorta is aneurysmal, measuring 3.8 cm.  There is no
evidence for aortic aneurysmal rupture.  No evidence for
dissection.  Study is performed without contrast, limiting
evaluation for dissection however.  Note is made of an umbilical
hernia, containing only mesenteric fat.  There is no evidence for
bowel obstruction.
IMPRESSION: 1.  Bilateral renal cysts.
2.  Abdominal aortic aneurysm.
3.  Umbilical hernia.

CT PELVIS
FINDINGS: There are numerous sigmoid diverticula but there is no
CT evidence for acute diverticulitis.  There is no pelvic
adenopathy or free pelvic fluid.  Pelvic small bowel loops have a
normal appearance.  The appendix is well seen and normal
appearance.  There are significant degenerative changes, especially
L2-3 and L3-4.  Degenerative changes are also identified at T11-12.
IMPRESSION: Extensive diverticulosis.  No CT evidence for acute diverticulitis.
Degenerative changes in the spine.

I discussed findings with Dr. Siok in the emergency department.

## 2010-12-28 ENCOUNTER — Ambulatory Visit (INDEPENDENT_AMBULATORY_CARE_PROVIDER_SITE_OTHER): Payer: Medicare Other | Admitting: *Deleted

## 2010-12-28 DIAGNOSIS — I447 Left bundle-branch block, unspecified: Secondary | ICD-10-CM

## 2010-12-28 NOTE — Progress Notes (Signed)
Icd checked in device clinic

## 2011-03-01 ENCOUNTER — Encounter: Payer: Self-pay | Admitting: Internal Medicine

## 2011-03-01 LAB — BASIC METABOLIC PANEL
BUN: 13
BUN: 18
CO2: 27
CO2: 27
CO2: 28
Calcium: 8.7
Calcium: 8.7
Calcium: 8.7
Chloride: 104
Chloride: 106
Creatinine, Ser: 1.06
Creatinine, Ser: 1.12
Creatinine, Ser: 1.17
Creatinine, Ser: 1.22 — ABNORMAL HIGH
GFR calc Af Amer: 53 — ABNORMAL LOW
GFR calc Af Amer: 60 — ABNORMAL LOW
GFR calc non Af Amer: 46 — ABNORMAL LOW
GFR calc non Af Amer: 47 — ABNORMAL LOW
GFR calc non Af Amer: 50 — ABNORMAL LOW
Glucose, Bld: 101 — ABNORMAL HIGH
Glucose, Bld: 120 — ABNORMAL HIGH
Glucose, Bld: 154 — ABNORMAL HIGH
Glucose, Bld: 92
Potassium: 3.6
Sodium: 137
Sodium: 140

## 2011-03-01 LAB — CARDIAC PANEL(CRET KIN+CKTOT+MB+TROPI)
Relative Index: INVALID
Relative Index: INVALID
Relative Index: INVALID
Total CK: 69
Total CK: 89
Troponin I: 0.23 — ABNORMAL HIGH
Troponin I: 0.23 — ABNORMAL HIGH

## 2011-03-01 LAB — PROTIME-INR
INR: 1.4
Prothrombin Time: 17.8 — ABNORMAL HIGH

## 2011-03-01 LAB — CBC
HCT: 34.9 — ABNORMAL LOW
HCT: 38.2
HCT: 41.1
Hemoglobin: 11.8 — ABNORMAL LOW
Hemoglobin: 12.6
Hemoglobin: 12.7
Hemoglobin: 12.7
Hemoglobin: 13.8
MCHC: 33.6
MCHC: 33.9
MCHC: 34.3
MCV: 87.5
MCV: 88.6
MCV: 88.9
RBC: 3.94
RBC: 4.19
RBC: 4.26
RBC: 4.62
RDW: 13.9
RDW: 13.9
RDW: 14
WBC: 4.2
WBC: 4.8
WBC: 4.9

## 2011-03-01 LAB — CK TOTAL AND CKMB (NOT AT ARMC)
CK, MB: 2.5
Relative Index: 2.2
Total CK: 112

## 2011-03-01 LAB — HEPARIN LEVEL (UNFRACTIONATED)
Heparin Unfractionated: 0.64
Heparin Unfractionated: 0.78 — ABNORMAL HIGH

## 2011-03-01 LAB — TSH: TSH: 4.692 — ABNORMAL HIGH

## 2011-03-01 LAB — HEPATIC FUNCTION PANEL
ALT: 12
AST: 18
Bilirubin, Direct: 0.3

## 2011-03-02 ENCOUNTER — Encounter: Payer: Self-pay | Admitting: Internal Medicine

## 2011-03-02 ENCOUNTER — Ambulatory Visit (INDEPENDENT_AMBULATORY_CARE_PROVIDER_SITE_OTHER): Payer: Medicare Other | Admitting: Internal Medicine

## 2011-03-02 DIAGNOSIS — I4901 Ventricular fibrillation: Secondary | ICD-10-CM

## 2011-03-02 DIAGNOSIS — I2589 Other forms of chronic ischemic heart disease: Secondary | ICD-10-CM

## 2011-03-02 DIAGNOSIS — I4891 Unspecified atrial fibrillation: Secondary | ICD-10-CM

## 2011-03-02 DIAGNOSIS — Z95810 Presence of automatic (implantable) cardiac defibrillator: Secondary | ICD-10-CM

## 2011-03-02 DIAGNOSIS — E785 Hyperlipidemia, unspecified: Secondary | ICD-10-CM

## 2011-03-02 LAB — ICD DEVICE OBSERVATION
AL AMPLITUDE: 0.9 mv
AL THRESHOLD: 0.5 V
BAMS-0001: 150 {beats}/min
FVT: 0
LV LEAD THRESHOLD: 2.75 V
MODE SWITCH EPISODES: 110
TZAT-0001SLOWVT: 1
TZAT-0004SLOWVT: 8
TZAT-0012FASTVT: 200 ms
TZAT-0012SLOWVT: 200 ms
TZAT-0013FASTVT: 2
TZAT-0018FASTVT: NEGATIVE
TZAT-0020FASTVT: 1 ms
TZON-0003FASTVT: 300 ms
TZON-0005FASTVT: 6
TZST-0001FASTVT: 5
TZST-0001SLOWVT: 3
TZST-0001SLOWVT: 4
TZST-0003FASTVT: 36 J
TZST-0003SLOWVT: 15 J
VENTRICULAR PACING ICD: 90 pct
VF: 0

## 2011-03-02 NOTE — Patient Instructions (Signed)
Your physician recommends that you schedule a follow-up appointment in: 3 months with device clinic and 12 months with Dr Taylor  

## 2011-03-02 NOTE — Assessment & Plan Note (Signed)
She is instructed to maintain a low-fat diet. She'll continue her statin therapy.

## 2011-03-02 NOTE — Progress Notes (Signed)
HPI [name] returns today for followup. She is a pleasant 77 yo woman with a h/o CHB, chronic systolic CHF, PAF, controlled on amiodarone, and s/p BiV ICD. He continues to do well. She has had no ICD shock. She denies peripheral edema. She has not been hospitalized for congestive heart failure. She has rare palpitations. No syncope. No Known Allergies   Current Outpatient Prescriptions  Medication Sig Dispense Refill  . amiodarone (PACERONE) 200 MG tablet TAKE ONE-HALF (1/2) TABLET DAILY  45 tablet  3  . aspirin 81 MG tablet Take 81 mg by mouth daily.        . carvedilol (COREG) 6.25 MG tablet TAKE 1 TABLET TWICE A DAY  180 tablet  0  . clopidogrel (PLAVIX) 75 MG tablet Take 75 mg by mouth daily.        Marland Kitchen levothyroxine (SYNTHROID, LEVOTHROID) 50 MCG tablet TAKE 1 TABLET DAILY  90 tablet  2  . lisinopril-hydrochlorothiazide (PRINZIDE,ZESTORETIC) 10-12.5 MG per tablet Take 1 tablet by mouth daily.  90 tablet  4  . rosuvastatin (CRESTOR) 10 MG tablet Take 10 mg by mouth daily.           Past Medical History  Diagnosis Date  . Atrial fibrillation   . Hyperlipidemia   . Myocardial infarction   . Hyperlipidemia   . Osteoarthritis   . ICD (implantable cardiac defibrillator) in place     St. Jude  . LBBB (left bundle branch block)   . Ventricular tachycardia   . Cardiomyopathy   . Glaucoma   . History of uterine cancer   . History of total hysterectomy     ROS:   All systems reviewed and negative except as noted in the HPI.   Past Surgical History  Procedure Date  . Coronary artery bypass graft 1995  . Cataract extraction   . Cholecystectomy   . Coronary angioplasty   . Hernia repair      Family History  Problem Relation Age of Onset  . Heart attack Father   . Diverticulitis Mother   . Ovarian cancer Sister      History   Social History  . Marital Status: Widowed    Spouse Name: N/A    Number of Children: 1  . Years of Education: N/A   Occupational  History  .     Social History Main Topics  . Smoking status: Former Games developer  . Smokeless tobacco: Not on file  . Alcohol Use: Not on file  . Drug Use: Not on file  . Sexually Active: Not on file   Other Topics Concern  . Not on file   Social History Narrative  . No narrative on file     BP 127/75  Pulse 83  Ht 5\' 3"  (1.6 m)  Wt 215 lb 12.8 oz (97.886 kg)  BMI 38.23 kg/m2  Physical Exam:  Well appearing elderly woman, NAD HEENT: Unremarkable Neck:  No JVD, no thyromegally Lymphatics:  No adenopathy Back:  No CVA tenderness Lungs:  Clear with no wheezes, rales, or rhonchi. HEART:  Regular rate rhythm, no murmurs, no rubs, no clicks Abd:  soft, positive bowel sounds, no organomegally, no rebound, no guarding Ext:  2 plus pulses, no edema, no cyanosis, no clubbing Skin:  No rashes no nodules Neuro:  CN II through XII intact, motor grossly intact  DEVICE  Normal device function.  See PaceArt for details. Approaching ERI  Assess/Plan:

## 2011-03-02 NOTE — Assessment & Plan Note (Signed)
Her device is working normally but is approaching ERI. I discussed the possibility of switching to a biventricular pacemaker. We'll plan to see her back in several months. If she has no ventricular arrhythmias, would likely expect her to undergo biventricular pacemaker insertion.

## 2011-03-02 NOTE — Assessment & Plan Note (Signed)
She has had no symptoms. We'll continue a period of watchful waiting.

## 2011-03-02 NOTE — Assessment & Plan Note (Signed)
She denies anginal symptoms. She will continue her current medical therapy. 

## 2011-03-03 LAB — CBC
HCT: 42.9
Platelets: 138 — ABNORMAL LOW
RDW: 14.2

## 2011-03-03 LAB — PROTIME-INR
INR: 3.4 — ABNORMAL HIGH
Prothrombin Time: 30.9 — ABNORMAL HIGH
Prothrombin Time: 37.1 — ABNORMAL HIGH

## 2011-03-03 LAB — BASIC METABOLIC PANEL
BUN: 16
BUN: 25 — ABNORMAL HIGH
CO2: 27
Chloride: 105
Creatinine, Ser: 1.13
GFR calc Af Amer: >60
GFR calc non Af Amer: 39 — ABNORMAL LOW
GFR calc non Af Amer: 46 — ABNORMAL LOW
Glucose, Bld: 127 — ABNORMAL HIGH
Glucose, Bld: 99
Potassium: 3.6
Potassium: 4.5
Sodium: 137

## 2011-03-03 LAB — PHOSPHORUS: Phosphorus: 3.6

## 2011-03-03 LAB — LIPID PANEL
Cholesterol: 136
HDL: 44

## 2011-03-03 LAB — CALCIUM: Calcium: 9

## 2011-03-03 LAB — CARDIAC PANEL(CRET KIN+CKTOT+MB+TROPI)
CK, MB: 4.1 — ABNORMAL HIGH
Relative Index: 2.2
Total CK: 183 — ABNORMAL HIGH

## 2011-03-03 LAB — TROPONIN I: Troponin I: 0.12 — ABNORMAL HIGH

## 2011-03-03 LAB — TSH: TSH: 2.838

## 2011-03-03 LAB — MAGNESIUM: Magnesium: 2.1

## 2011-03-05 ENCOUNTER — Other Ambulatory Visit: Payer: Self-pay | Admitting: Internal Medicine

## 2011-03-18 ENCOUNTER — Ambulatory Visit (INDEPENDENT_AMBULATORY_CARE_PROVIDER_SITE_OTHER): Payer: Medicare Other

## 2011-03-18 DIAGNOSIS — Z23 Encounter for immunization: Secondary | ICD-10-CM

## 2011-04-23 ENCOUNTER — Observation Stay (HOSPITAL_COMMUNITY)
Admission: EM | Admit: 2011-04-23 | Discharge: 2011-04-26 | DRG: 203 | Disposition: A | Discharge status: Home / Self Care | Payer: Medicare Other | Source: Ambulatory Visit | Attending: Internal Medicine | Admitting: Internal Medicine

## 2011-04-23 ENCOUNTER — Encounter: Payer: Self-pay | Admitting: Family Medicine

## 2011-04-23 ENCOUNTER — Encounter (HOSPITAL_COMMUNITY): Payer: Self-pay | Admitting: *Deleted

## 2011-04-23 ENCOUNTER — Emergency Department (HOSPITAL_COMMUNITY): Payer: Medicare Other

## 2011-04-23 ENCOUNTER — Ambulatory Visit (INDEPENDENT_AMBULATORY_CARE_PROVIDER_SITE_OTHER): Payer: Medicare Other | Admitting: Family Medicine

## 2011-04-23 VITALS — BP 124/76 | HR 77 | Temp 99.0°F | Wt 220.0 lb

## 2011-04-23 DIAGNOSIS — I714 Abdominal aortic aneurysm, without rupture, unspecified: Secondary | ICD-10-CM

## 2011-04-23 DIAGNOSIS — R0602 Shortness of breath: Secondary | ICD-10-CM | POA: Insufficient documentation

## 2011-04-23 DIAGNOSIS — I4891 Unspecified atrial fibrillation: Secondary | ICD-10-CM

## 2011-04-23 DIAGNOSIS — I252 Old myocardial infarction: Secondary | ICD-10-CM

## 2011-04-23 DIAGNOSIS — R059 Cough, unspecified: Secondary | ICD-10-CM | POA: Insufficient documentation

## 2011-04-23 DIAGNOSIS — R0902 Hypoxemia: Principal | ICD-10-CM

## 2011-04-23 DIAGNOSIS — J189 Pneumonia, unspecified organism: Secondary | ICD-10-CM

## 2011-04-23 DIAGNOSIS — R509 Fever, unspecified: Secondary | ICD-10-CM | POA: Insufficient documentation

## 2011-04-23 DIAGNOSIS — E876 Hypokalemia: Secondary | ICD-10-CM | POA: Insufficient documentation

## 2011-04-23 DIAGNOSIS — I447 Left bundle-branch block, unspecified: Secondary | ICD-10-CM

## 2011-04-23 DIAGNOSIS — I2510 Atherosclerotic heart disease of native coronary artery without angina pectoris: Secondary | ICD-10-CM | POA: Insufficient documentation

## 2011-04-23 DIAGNOSIS — I10 Essential (primary) hypertension: Secondary | ICD-10-CM | POA: Insufficient documentation

## 2011-04-23 DIAGNOSIS — R05 Cough: Secondary | ICD-10-CM | POA: Insufficient documentation

## 2011-04-23 DIAGNOSIS — E785 Hyperlipidemia, unspecified: Secondary | ICD-10-CM

## 2011-04-23 DIAGNOSIS — H409 Unspecified glaucoma: Secondary | ICD-10-CM

## 2011-04-23 DIAGNOSIS — M1990 Unspecified osteoarthritis, unspecified site: Secondary | ICD-10-CM

## 2011-04-23 DIAGNOSIS — I4901 Ventricular fibrillation: Secondary | ICD-10-CM

## 2011-04-23 DIAGNOSIS — I2589 Other forms of chronic ischemic heart disease: Secondary | ICD-10-CM

## 2011-04-23 DIAGNOSIS — Z8542 Personal history of malignant neoplasm of other parts of uterus: Secondary | ICD-10-CM

## 2011-04-23 DIAGNOSIS — Z95810 Presence of automatic (implantable) cardiac defibrillator: Secondary | ICD-10-CM

## 2011-04-23 HISTORY — DX: Atherosclerotic heart disease of native coronary artery without angina pectoris: I25.10

## 2011-04-23 HISTORY — DX: Essential (primary) hypertension: I10

## 2011-04-23 LAB — DIFFERENTIAL
Eosinophils Absolute: 0.1 10*3/uL (ref 0.0–0.7)
Eosinophils Relative: 1 % (ref 0–5)
Lymphs Abs: 1.4 10*3/uL (ref 0.7–4.0)
Monocytes Relative: 13 % — ABNORMAL HIGH (ref 3–12)

## 2011-04-23 LAB — CBC
Hemoglobin: 13.8 g/dL (ref 12.0–15.0)
MCH: 30.9 pg (ref 26.0–34.0)
MCV: 91.7 fL (ref 78.0–100.0)
Platelets: 139 10*3/uL — ABNORMAL LOW (ref 150–400)
RBC: 4.46 MIL/uL (ref 3.87–5.11)

## 2011-04-23 LAB — BASIC METABOLIC PANEL
BUN: 14 mg/dL (ref 6–23)
Calcium: 9.1 mg/dL (ref 8.4–10.5)
Creatinine, Ser: 0.94 mg/dL (ref 0.50–1.10)
GFR calc non Af Amer: 53 mL/min — ABNORMAL LOW (ref >90)
Glucose, Bld: 122 mg/dL — ABNORMAL HIGH (ref 70–99)

## 2011-04-23 MED ORDER — ACETAMINOPHEN 650 MG RE SUPP: 650.0000 mg | Freq: Four times a day (QID) | RECTAL | Status: DC | PRN

## 2011-04-23 MED ORDER — ENOXAPARIN SODIUM 40 MG/0.4ML ~~LOC~~ SOLN
40.0000 mg | SUBCUTANEOUS | Status: DC
  Administered 2011-04-24 – 2011-04-26 (×3): 40 mg via SUBCUTANEOUS
  Filled 2011-04-23 (×3): qty 0.4

## 2011-04-23 MED ORDER — AZITHROMYCIN 250 MG PO TABS
500.0000 mg | ORAL_TABLET | Freq: Once | ORAL | Status: AC
  Administered 2011-04-23: 500 mg via ORAL
  Filled 2011-04-23: qty 2

## 2011-04-23 MED ORDER — CARVEDILOL 6.25 MG PO TABS
6.2500 mg | ORAL_TABLET | Freq: Two times a day (BID) | ORAL | Status: DC
  Administered 2011-04-24 – 2011-04-26 (×5): 6.25 mg via ORAL
  Filled 2011-04-23 (×7): qty 1

## 2011-04-23 MED ORDER — ALBUTEROL SULFATE (5 MG/ML) 0.5% IN NEBU
5.0000 mg | INHALATION_SOLUTION | Freq: Once | RESPIRATORY_TRACT | Status: AC
  Administered 2011-04-23: 5 mg via RESPIRATORY_TRACT
  Filled 2011-04-23: qty 1

## 2011-04-23 MED ORDER — SODIUM CHLORIDE 0.9 % IV SOLN: 250.0000 mL | INTRAVENOUS | Status: DC

## 2011-04-23 MED ORDER — ALBUTEROL SULFATE (5 MG/ML) 0.5% IN NEBU: 2.5000 mg | INHALATION_SOLUTION | RESPIRATORY_TRACT | Status: DC | PRN

## 2011-04-23 MED ORDER — LATANOPROST 0.005 % OP SOLN
1.0000 [drp] | Freq: Every day | OPHTHALMIC | Status: DC
  Administered 2011-04-25: 1 [drp] via OPHTHALMIC
  Filled 2011-04-23: qty 2.5

## 2011-04-23 MED ORDER — ONDANSETRON HCL 4 MG PO TABS: 4.0000 mg | ORAL_TABLET | Freq: Four times a day (QID) | ORAL | Status: DC | PRN

## 2011-04-23 MED ORDER — LISINOPRIL 10 MG PO TABS
10.0000 mg | ORAL_TABLET | Freq: Every day | ORAL | Status: DC
  Administered 2011-04-24 – 2011-04-26 (×3): 10 mg via ORAL
  Filled 2011-04-23 (×3): qty 1

## 2011-04-23 MED ORDER — LISINOPRIL-HYDROCHLOROTHIAZIDE 10-12.5 MG PO TABS: 1.0000 | ORAL_TABLET | Freq: Every day | ORAL | Status: DC

## 2011-04-23 MED ORDER — ROSUVASTATIN CALCIUM 10 MG PO TABS
10.0000 mg | ORAL_TABLET | Freq: Every day | ORAL | Status: DC
  Administered 2011-04-24 – 2011-04-25 (×2): 10 mg via ORAL
  Filled 2011-04-23 (×3): qty 1

## 2011-04-23 MED ORDER — ASPIRIN 81 MG PO CHEW
81.0000 mg | CHEWABLE_TABLET | Freq: Every day | ORAL | Status: DC
  Administered 2011-04-24 – 2011-04-26 (×3): 81 mg via ORAL
  Filled 2011-04-23 (×3): qty 1

## 2011-04-23 MED ORDER — AZITHROMYCIN 250 MG PO TABS
250.0000 mg | ORAL_TABLET | Freq: Every day | ORAL | Status: DC
  Administered 2011-04-24 – 2011-04-26 (×3): 250 mg via ORAL
  Filled 2011-04-23 (×3): qty 1

## 2011-04-23 MED ORDER — AMIODARONE HCL 100 MG PO TABS
100.0000 mg | ORAL_TABLET | Freq: Every day | ORAL | Status: DC
  Administered 2011-04-24 – 2011-04-26 (×3): 100 mg via ORAL
  Filled 2011-04-23 (×3): qty 1

## 2011-04-23 MED ORDER — THERA M PLUS PO TABS
1.0000 | ORAL_TABLET | Freq: Every day | ORAL | Status: DC
  Administered 2011-04-24 – 2011-04-26 (×3): 1 via ORAL
  Filled 2011-04-23 (×3): qty 1

## 2011-04-23 MED ORDER — HYDROCODONE-ACETAMINOPHEN 5-325 MG PO TABS: 1.0000 | ORAL_TABLET | ORAL | Status: DC | PRN

## 2011-04-23 MED ORDER — ONDANSETRON HCL 4 MG/2ML IJ SOLN: 4.0000 mg | Freq: Four times a day (QID) | INTRAMUSCULAR | Status: DC | PRN

## 2011-04-23 MED ORDER — SODIUM CHLORIDE 0.9 % IJ SOLN: 3.0000 mL | INTRAMUSCULAR | Status: DC | PRN

## 2011-04-23 MED ORDER — ALBUTEROL SULFATE (5 MG/ML) 0.5% IN NEBU
2.5000 mg | INHALATION_SOLUTION | Freq: Four times a day (QID) | RESPIRATORY_TRACT | Status: DC
  Administered 2011-04-24 (×2): 2.5 mg via RESPIRATORY_TRACT
  Filled 2011-04-23: qty 0.5

## 2011-04-23 MED ORDER — ALUM & MAG HYDROXIDE-SIMETH 200-200-20 MG/5ML PO SUSP: 30.0000 mL | Freq: Four times a day (QID) | ORAL | Status: DC | PRN

## 2011-04-23 MED ORDER — LEVOTHYROXINE SODIUM 50 MCG PO TABS
50.0000 ug | ORAL_TABLET | Freq: Every day | ORAL | Status: DC
  Administered 2011-04-24 – 2011-04-26 (×3): 50 ug via ORAL
  Filled 2011-04-23 (×4): qty 1

## 2011-04-23 MED ORDER — SODIUM CHLORIDE 0.9 % IJ SOLN
3.0000 mL | Freq: Two times a day (BID) | INTRAMUSCULAR | Status: DC
  Administered 2011-04-24 (×2): 3 mL via INTRAVENOUS

## 2011-04-23 MED ORDER — ACETAMINOPHEN 325 MG PO TABS: 650.0000 mg | ORAL_TABLET | Freq: Four times a day (QID) | ORAL | Status: DC | PRN

## 2011-04-23 MED ORDER — DEXTROSE 5 % IV SOLN
1.0000 g | Freq: Once | INTRAVENOUS | Status: AC
  Administered 2011-04-23: 1 g via INTRAVENOUS
  Filled 2011-04-23: qty 10

## 2011-04-23 MED ORDER — ACETAMINOPHEN 325 MG PO TABS
650.0000 mg | ORAL_TABLET | Freq: Once | ORAL | Status: AC
  Administered 2011-04-23: 650 mg via ORAL
  Filled 2011-04-23: qty 2

## 2011-04-23 MED ORDER — HYDROCHLOROTHIAZIDE 12.5 MG PO CAPS
12.5000 mg | ORAL_CAPSULE | Freq: Every day | ORAL | Status: DC
  Administered 2011-04-24 – 2011-04-26 (×3): 12.5 mg via ORAL
  Filled 2011-04-23 (×3): qty 1

## 2011-04-23 MED ORDER — DEXTROSE 5 % IV SOLN
1.0000 g | INTRAVENOUS | Status: DC
  Administered 2011-04-24 – 2011-04-25 (×2): 1 g via INTRAVENOUS
  Filled 2011-04-23 (×3): qty 10

## 2011-04-23 MED ORDER — IPRATROPIUM BROMIDE 0.02 % IN SOLN
0.5000 mg | Freq: Once | RESPIRATORY_TRACT | Status: AC
  Administered 2011-04-23: 0.5 mg via RESPIRATORY_TRACT
  Filled 2011-04-23: qty 2.5

## 2011-04-23 MED ORDER — CLOPIDOGREL BISULFATE 75 MG PO TABS
75.0000 mg | ORAL_TABLET | Freq: Every day | ORAL | Status: DC
  Filled 2011-04-23 (×2): qty 1

## 2011-04-23 MED ORDER — CEFTRIAXONE SODIUM 1 G IJ SOLR
1.0000 g | INTRAMUSCULAR | Status: DC
  Filled 2011-04-23: qty 10

## 2011-04-23 NOTE — ED Notes (Signed)
Pt observed coughing up thick green sputum

## 2011-04-23 NOTE — ED Provider Notes (Addendum)
History     CSN: [id number] Arrival date & time: 04/23/2011  1:38 PM   First MD Initiated Contact with Patient 04/23/11 1705      Chief Complaint  Patient presents with  . Shortness of Breath  . Cough    (Consider location/radiation/quality/duration/timing/severity/associated sxs/prior treatment) HPI Comments: Patient presents with worsening shortness of breath and cough over the last week.  She has become more short of breath with some exertion.  She notes some productive cough.  She has no chest pain or chest pressure.  She has had subjective fevers.  He was seen by her primary care physician Dr. Joselyn Glassman today who sent her here for further evaluation out of concern for possible pneumonia.  She did have oxygen saturations of 82% on room air in the office.  Patient has no prior lung disease.  She quit smoking in 1984.  Patient is a 77 y.o. female presenting with shortness of breath and cough. The history is provided by the patient and a relative. No language interpreter was used.  Shortness of Breath  The current episode started 5 to 7 days ago. The onset was gradual. The problem has been gradually worsening. The problem is moderate. The symptoms are relieved by nothing. The symptoms are aggravated by nothing. Associated symptoms include a fever, cough and shortness of breath. Pertinent negatives include no chest pain, no chest pressure and no sore throat.  Cough Associated symptoms include shortness of breath. Pertinent negatives include no chest pain, no chills, no headaches, no sore throat and no eye redness.    Past Medical History  Diagnosis Date  . Atrial fibrillation   . Hyperlipidemia   . Myocardial infarction   . Hyperlipidemia   . Osteoarthritis   . ICD (implantable cardiac defibrillator) in place     St. Jude  . LBBB (left bundle branch block)   . Ventricular tachycardia   . Cardiomyopathy   . Glaucoma   . History of uterine cancer   . History of total hysterectomy     . Coronary artery disease   . Hypertension     Past Surgical History  Procedure Date  . Coronary artery bypass graft 1995  . Cataract extraction   . Cholecystectomy   . Coronary angioplasty   . Hernia repair   . Coronary angioplasty with stent placement   . Cardiac defibrillator placement   . Cardiac defibrillator placement     Family History  Problem Relation Age of Onset  . Heart attack Father   . Diverticulitis Mother   . Ovarian cancer Sister     History  Substance Use Topics  . Smoking status: Former Games developer  . Smokeless tobacco: Not on file  . Alcohol Use: No    OB History    Grav Para Term Preterm Abortions TAB SAB Ect Mult Living                  Review of Systems  Constitutional: Positive for fever. Negative for chills.  HENT: Negative.  Negative for sore throat.   Eyes: Negative.  Negative for discharge and redness.  Respiratory: Positive for cough and shortness of breath.   Cardiovascular: Negative.  Negative for chest pain.  Gastrointestinal: Negative.  Negative for nausea, vomiting, abdominal pain and diarrhea.  Genitourinary: Negative.  Negative for dysuria and vaginal discharge.  Musculoskeletal: Negative.  Negative for back pain.  Skin: Negative.  Negative for color change and rash.  Neurological: Negative.  Negative for syncope and headaches.  Hematological: Negative.  Negative for adenopathy.  Psychiatric/Behavioral: Negative.  Negative for confusion.  All other systems reviewed and are negative.    Allergies  Review of patient's allergies indicates no known allergies.  Home Medications   Current Outpatient Rx  Name Route Sig Dispense Refill  . AMIODARONE HCL 200 MG PO TABS Oral Take 100 mg by mouth daily.      . ASPIRIN 81 MG PO TABS Oral Take 81 mg by mouth daily.     Marland Kitchen CARVEDILOL 6.25 MG PO TABS Oral Take 6.25 mg by mouth 2 (two) times daily with a meal.      . CLOPIDOGREL BISULFATE 75 MG PO TABS Oral Take 75 mg by mouth daily.      Marland Kitchen LATANOPROST 0.005 % OP SOLN Left Eye Place 1 drop into the left eye at bedtime.      Marland Kitchen LEVOTHYROXINE SODIUM 50 MCG PO TABS Oral Take 50 mcg by mouth daily.      Marland Kitchen LISINOPRIL-HYDROCHLOROTHIAZIDE 10-12.5 MG PO TABS Oral Take 1 tablet by mouth daily. 90 tablet 4  . THERA M PLUS PO TABS Oral Take 1 tablet by mouth daily.      Frazier Butt OP Ophthalmic Apply 1 drop to eye at bedtime.      Marland Kitchen ROSUVASTATIN CALCIUM 10 MG PO TABS Oral Take 10 mg by mouth at bedtime.       BP 121/74  Pulse 73  Temp(Src) 99.2 F (37.3 C) (Oral)  Resp 18  SpO2 93%  Physical Exam  Constitutional: She is oriented to person, place, and time. She appears well-developed and well-nourished.  Non-toxic appearance. She does not have a sickly appearance.  HENT:  Head: Normocephalic and atraumatic.  Eyes: Conjunctivae, EOM and lids are normal. Pupils are equal, round, and reactive to light. No scleral icterus.  Neck: Trachea normal and normal range of motion. Neck supple.  Cardiovascular: Normal rate, regular rhythm and normal heart sounds.   Pulmonary/Chest: Effort normal. No respiratory distress. She has decreased breath sounds. She has wheezes.       Patient has expiratory wheezing bilaterally and decreased breath sounds bilaterally  Abdominal: Soft. Normal appearance. There is no tenderness. There is no rebound, no guarding and no CVA tenderness.  Musculoskeletal: Normal range of motion.  Neurological: She is alert and oriented to person, place, and time. She has normal strength.  Skin: Skin is warm, dry and intact. No rash noted.  Psychiatric: She has a normal mood and affect. Her behavior is normal. Judgment and thought content normal.    ED Course  Procedures (including critical care time)  Labs Reviewed  CBC - Abnormal; Notable for the following:    Platelets 139 (*)    All other components within normal limits  DIFFERENTIAL - Abnormal; Notable for the following:    Monocytes Relative 13 (*)    Monocytes  Absolute 1.3 (*)    All other components within normal limits  BASIC METABOLIC PANEL - Abnormal; Notable for the following:    Sodium 134 (*)    Potassium 3.3 (*)    Chloride 95 (*)    Glucose, Bld 122 (*)    GFR calc non Af Amer 53 (*)    GFR calc Af Amer 62 (*)    All other components within normal limits  INFLUENZA PANEL BY PCR   Dg Chest 2 View  04/23/2011  *RADIOLOGY REPORT*  Clinical Data: Cough, congestion and shortness of breath.  CHEST - 2 VIEW  Comparison:  02/13/2008.  Findings: Trachea is midline.  Heart is mildly enlarged, stable. Pacemaker and AICD lead tips are stable in position.  Thoracic aorta is calcified.  Probable linear scarring at the left lung base.  Lungs are otherwise clear.  No pleural fluid.  IMPRESSION: No acute findings.  Original Report Authenticated By: Reyes Ivan, M.D.     No diagnosis found.    MDM  Patient presents with outpatient hypoxia and relative hypoxia here with saturations of 91% 92%.  Given patient has no past lung disease history this is abnormal.  Patient has abnormal respiratory findings on his exam as well.  While her chest x-ray was clear given her symptoms and her hypoxia I have treated her for community-acquired pneumonia.  Another consideration would be influenza in this patient although she did obtain her flu shot.  I have ordered an influenza swab to be sent off.  Given her lung findings and her relative hypoxia I feel she warrants admission for further monitoring and treatment.        Nat Christen, MD 04/23/11 1928  Discussed with [facility] for admission to team 9, Dr. Jerral Ralph, tele bed.    Nat Christen, MD 04/23/11 [id number]

## 2011-04-23 NOTE — ED Notes (Signed)
Patient has been sick since last Thursday.  She has tried expectorant w/o relief.  Patient was seen by her md today.  She was sent to the ED for further eval of possible pneumonia

## 2011-04-23 NOTE — ED Notes (Signed)
2nd call made to 5500 floor to give report, requested to speak with Charge Nurse who was unavailable awaiting return phone call

## 2011-04-23 NOTE — Patient Instructions (Signed)
Please go to [facility] ER for evaluation  Your oxygen level is much too low (82% without oxygen)  You also have a fever  I am worried about pneumonia and you are wheezing  It is ok to go by car

## 2011-04-23 NOTE — Progress Notes (Signed)
Per Dr. Royden Purl request I called [facility] ED and spoke to [facility], triage nurse and advised that patient was on her way to the ED being transported by car by a family member for ED physician to evaluate.  Patient is hypoxic, has a cough, and a fever.  She is an elderly heart patient with suspiciousions of pneumonia.  I faxed the office visit note from today's visit to Ralice at [phone].

## 2011-04-23 NOTE — Progress Notes (Signed)
  Subjective:    Patient ID: [name], female    DOB: [birth year], 77 y.o.   MRN: [id number]  HPI Started getting sick over 1 week  Sinus problems over 2 weeks  After sitting out at a sporting event in the cold   Temp 99-has had a fever  Lots of chest congestion Coughing up mucous - yellow and green  ? Pneumonia  Not blowing nose too much  No sore throat  Has had mucinex - then used something with DM   Pulse ox was 82% on room air   No n/v     Review of Systems     Objective:   Physical Exam  Constitutional: She appears well-developed and well-nourished. No distress.       Obese and hypoxic  Pulmonary/Chest: Effort normal. She has wheezes. She has rales. She exhibits no tenderness.       Harsh bs with diffuse rhonchi and wheezes Some mild rales in bases as well as crackles  Pulse ox goes up to 97 on @2L  at rest  Pt is very mildly sob  Psychiatric: She has a normal mood and affect.          Assessment & Plan:

## 2011-04-23 NOTE — Assessment & Plan Note (Signed)
Acute hypoxia in setting of fever and productive cough with some reactive airways Worrisome for pneumonia in this elderly heart pt  Sent to ER  By car  For eval and tx

## 2011-04-23 NOTE — H&P (Signed)
[name] is an 77 y.o. female.   Chief Complaint: "Just tired of coughing"  HPI: Presents with approximately 5 days of predominantly cough.  Green sputum production.  Mild shortness of breath, but navigates within house with walker without having to stop for breathlessness.  Denies sick contacts, particularly the flu.  Has felt like a low grade temp, checked it early and was 99.  No body aches.  Went to see PMD today and O2 sat was 82% so referred to the ED for further evaluation.  Reports generally fairly good health without a lot of respiratory issues.  No recent prolonged travel or down time.  She states multiple times that she feels generally "ok", that it is just the cough she can not take any longer.  Has tried OTC expectorant without improvement in her symptoms.  Past Medical History  Diagnosis Date  . Atrial fibrillation   . Hyperlipidemia   . Myocardial infarction   . Hyperlipidemia   . Osteoarthritis   . ICD (implantable cardiac defibrillator) in place     St. Jude  . LBBB (left bundle branch block)   . Ventricular tachycardia   . Cardiomyopathy   . Glaucoma   . History of uterine cancer   . History of total hysterectomy   . Coronary artery disease   . Hypertension     Past Surgical History  Procedure Date  . Coronary artery bypass graft 1995  . Cataract extraction   . Cholecystectomy   . Coronary angioplasty   . Hernia repair   . Coronary angioplasty with stent placement   . Cardiac defibrillator placement   . Cardiac defibrillator placement     Family History  Problem Relation Age of Onset  . Heart attack Father   . Diverticulitis Mother   . Ovarian cancer Sister    Social History:  reports that she has quit smoking. She does not have any smokeless tobacco history on file. She reports that she does not drink alcohol or use illicit drugs.  Allergies: No Known Allergies  Medications Prior to Admission  Medication Dose Route Frequency Provider Last Rate  Last Dose  . acetaminophen (TYLENOL) tablet 650 mg  650 mg Oral Once Nat Christen, MD   650 mg at 04/23/11 1811  . albuterol (PROVENTIL) (5 MG/ML) 0.5% nebulizer solution 5 mg  5 mg Nebulization Once Nat Christen, MD   5 mg at 04/23/11 1734  . azithromycin (ZITHROMAX) tablet 500 mg  500 mg Oral Once Nat Christen, MD   500 mg at 04/23/11 1811  . cefTRIAXone (ROCEPHIN) 1 g in dextrose 5 % 50 mL IVPB  1 g Intravenous Once Nat Christen, MD   1 g at 04/23/11 1816  . ipratropium (ATROVENT) nebulizer solution 0.5 mg  0.5 mg Nebulization Once Nat Christen, MD   0.5 mg at 04/23/11 1734   Medications Prior to Admission  Medication Sig Dispense Refill  . aspirin 81 MG tablet Take 81 mg by mouth daily.       . clopidogrel (PLAVIX) 75 MG tablet Take 75 mg by mouth daily.       Marland Kitchen lisinopril-hydrochlorothiazide (PRINZIDE,ZESTORETIC) 10-12.5 MG per tablet Take 1 tablet by mouth daily.  90 tablet  4  . rosuvastatin (CRESTOR) 10 MG tablet Take 10 mg by mouth at bedtime.         Results for orders placed during the hospital encounter of 04/23/11 (from the past 48 hour(s))  CBC  Status: Abnormal   Collection Time   04/23/11  5:39 PM      Component Value Range Comment   WBC 9.9  4.0 - 10.5 (K/uL)    RBC 4.46  3.87 - 5.11 (MIL/uL)    Hemoglobin 13.8  12.0 - 15.0 (g/dL)    HCT 16.1  09.6 - 04.5 (%)    MCV 91.7  78.0 - 100.0 (fL)    MCH 30.9  26.0 - 34.0 (pg)    MCHC 33.7  30.0 - 36.0 (g/dL)    RDW 40.9  81.1 - 91.4 (%)    Platelets 139 (*) 150 - 400 (K/uL)   DIFFERENTIAL     Status: Abnormal   Collection Time   04/23/11  5:39 PM      Component Value Range Comment   Neutrophils Relative 72  43 - 77 (%)    Neutro Abs 7.1  1.7 - 7.7 (K/uL)    Lymphocytes Relative 14  12 - 46 (%)    Lymphs Abs 1.4  0.7 - 4.0 (K/uL)    Monocytes Relative 13 (*) 3 - 12 (%)    Monocytes Absolute 1.3 (*) 0.1 - 1.0 (K/uL)    Eosinophils Relative 1  0 - 5 (%)    Eosinophils Absolute 0.1  0.0 - 0.7  (K/uL)    Basophils Relative 0  0 - 1 (%)    Basophils Absolute 0.0  0.0 - 0.1 (K/uL)   BASIC METABOLIC PANEL     Status: Abnormal   Collection Time   04/23/11  5:39 PM      Component Value Range Comment   Sodium 134 (*) 135 - 145 (mEq/L)    Potassium 3.3 (*) 3.5 - 5.1 (mEq/L)    Chloride 95 (*) 96 - 112 (mEq/L)    CO2 25  19 - 32 (mEq/L)    Glucose, Bld 122 (*) 70 - 99 (mg/dL)    BUN 14  6 - 23 (mg/dL)    Creatinine, Ser 7.82  0.50 - 1.10 (mg/dL)    Calcium 9.1  8.4 - 10.5 (mg/dL)    GFR calc non Af Amer 53 (*) >90 (mL/min)    GFR calc Af Amer 62 (*) >90 (mL/min)    Dg Chest 2 View  04/23/2011  *RADIOLOGY REPORT*  Clinical Data: Cough, congestion and shortness of breath.  CHEST - 2 VIEW  Comparison: 02/13/2008.  Findings: Trachea is midline.  Heart is mildly enlarged, stable. Pacemaker and AICD lead tips are stable in position.  Thoracic aorta is calcified.  Probable linear scarring at the left lung base.  Lungs are otherwise clear.  No pleural fluid.  IMPRESSION: No acute findings.  Original Report Authenticated By: Reyes Ivan, M.D.    Review of Systems  Constitutional: Positive for fever. Negative for malaise/fatigue and diaphoresis.  HENT: Positive for congestion. Negative for sore throat.   Eyes: Negative for pain.  Respiratory: Positive for cough, sputum production (Green sputum production x 5 days, was taking expectorant OTC without improvement), shortness of breath and wheezing (Family reports this more than patient).   Cardiovascular: Positive for leg swelling (Occassionally, has had for years, no change in recent weeks). Negative for chest pain, palpitations, orthopnea and PND.  Gastrointestinal: Negative for abdominal pain, diarrhea and constipation.  Genitourinary: Negative for dysuria.  Musculoskeletal: Negative for myalgias. Joint pain: Lower knees/ankles have been bothering her, Tylenol helps.  Skin: Negative for rash.  Neurological: Negative for dizziness,  weakness and headaches.  Endo/Heme/Allergies: Negative.  Psychiatric/Behavioral: Negative.     Blood pressure 121/74, pulse 73, temperature 99.2 F (37.3 C), temperature source Oral, resp. rate 18, SpO2 93.00%. Physical Exam  Constitutional: She is oriented to person, place, and time. She appears well-nourished. No distress.  HENT:  Nose: Nose normal.  Mouth/Throat: No oropharyngeal exudate (erythematous but no exudate).  Eyes: Conjunctivae are normal.  Neck: Neck supple. No JVD present.  Cardiovascular: Normal rate and regular rhythm.   No murmur heard. Respiratory: Effort normal. She has wheezes (Coarse upper airway sounds but more pronounced on the Right, does not clear with cough, wheezing throughout all lung fields).  GI: Soft. Bowel sounds are normal. There is no tenderness.  Musculoskeletal: She exhibits no edema.  Lymphadenopathy:    She has no cervical adenopathy.  Neurological: She is alert and oriented to person, place, and time.  Skin: Skin is warm. No rash noted.  Psychiatric: She has a normal mood and affect.     Assessment/Plan 77 yo F with hx of CAD and HTN, and who presents with hypoxia and 5 days of cough and chest congestion.  **Hypoxia:  I suspect this is likely a Bacterial bronchitis vs Atypical PNA given duration of symptoms and sputum production coupled with hypoxia and a negative CXR.  Will admit to Obs and administer CAP coverage with Azithromycin and Ceftriaxone.  Will add on a d-dimer but history and symptoms don't really fit.  Supportive therapy with Tylenol,  q 6 hr Albuterol Nebulizer, and Robitussin DM as needed overnight. At the time of my exam, RA sats were 95%.  Low threshold to add on a small dose of Prednisone if wheezing doesn't improve.  Would benefit from PFTs given smoking hx.  **HTN: well controlled on home meds, will continue  **Hyperlipidemia:  On Rouvastatin, will continue  **Atrial Fibrillation: on Amiodarone and in NSR, will  continue  **h/o CAD and MI:  On ASA, Plavix and good BP control as above.  Will plan to continue all home meds.  ROSE-JONES,Donnelle Olmeda 04/23/2011, 8:25 PM

## 2011-04-24 MED ORDER — CLOPIDOGREL BISULFATE 75 MG PO TABS
75.0000 mg | ORAL_TABLET | Freq: Every day | ORAL | Status: DC
  Administered 2011-04-24 – 2011-04-25 (×2): 75 mg via ORAL
  Filled 2011-04-24 (×3): qty 1

## 2011-04-24 MED ORDER — LEVALBUTEROL HCL 0.63 MG/3ML IN NEBU
0.6300 mg | INHALATION_SOLUTION | Freq: Four times a day (QID) | RESPIRATORY_TRACT | Status: DC
  Administered 2011-04-24 – 2011-04-26 (×9): 0.63 mg via RESPIRATORY_TRACT
  Filled 2011-04-24 (×13): qty 3

## 2011-04-24 MED ORDER — PREDNISONE 50 MG PO TABS
50.0000 mg | ORAL_TABLET | Freq: Every day | ORAL | Status: DC
  Administered 2011-04-24 – 2011-04-25 (×2): 50 mg via ORAL
  Filled 2011-04-24 (×3): qty 1

## 2011-04-24 NOTE — Progress Notes (Signed)
Pt's tele rang out Asystole.  In room, pt was awake and oriented. VS were stable.  Lead placement was checked.  Will continue to monitor.  Angelique Blonder, RN

## 2011-04-24 NOTE — Progress Notes (Signed)
Pt refused AM blood draws.  Angelique Blonder, RN

## 2011-04-24 NOTE — Progress Notes (Signed)
Pt was admitted to the unit around 2250 with c/o SOB, productive cough, and weakness x1 week. Admitting diagnosis of hypoxia and r/o flu. No skin problems. Pt is ambulatory with standby assist, a&o x3. Mechele Collin

## 2011-04-24 NOTE — Progress Notes (Signed)
Patient went down to 34 HR asymptomatic on the phone.  Listened with a stethoscope and counted 68 HR. Dr Jerral Ralph on floor and aware. Will continue to monitor.   [name] [name] 04/24/2011  12:34 PM

## 2011-04-24 NOTE — Progress Notes (Signed)
Pt is refusing meds because, "it is too late at night to take them. I take them in the morning and then again around 7pm. I'll just wait until the morning." She also refused lab work due to a previous experience and states she will not have her blood drawn during this hospital visit. Mechele Collin

## 2011-04-24 NOTE — Progress Notes (Signed)
PATIENT DETAILS Name: [name] Age: 77 y.o. Sex: female Date of Birth: 13-Jan-1922 Admit Date: 04/23/2011 [id number] Tower, MD, MD  Subjective: Better but still wheezing.  Objective: Vital signs in last 24 hours: Filed Vitals:   04/24/11 0457 04/24/11 0500 04/24/11 0808 04/24/11 1334  BP: 111/70 108/58  102/61  Pulse: 73 73  76  Temp: 98.5 F (36.9 C)   98.1 F (36.7 C)  TempSrc: Oral     Resp: 24   22  Height:      Weight:      SpO2: 96% 97% 96% 92%    Weight change:   Body mass index is 38.71 kg/(m^2).  Intake/Output from previous day:  Intake/Output Summary (Last 24 hours) at 04/24/11 1537 Last data filed at 04/24/11 1300  Gross per 24 hour  Intake    363 ml  Output      1 ml  Net    362 ml    PHYSICAL EXAM: Gen Exam: Awake and alert with clear speech.  Neck: Supple, No JVD.   Chest: B/L aIR entry. Diffuse rhonchi CVS: S1 S2 Regular, no murmurs.  Abdomen: soft, BS +, non tender, non distended.  Extremities: no edema, lower extremities warm to touch. Neurologic: Non Focal.   Skin: No Rash.   Wounds: N/A.    CONSULTS:  none  LAB RESULTS: CBC  Lab 04/23/11 1739  WBC 9.9  HGB 13.8  HCT 40.9  PLT 139*  MCV 91.7  MCH 30.9  MCHC 33.7  RDW 13.5  LYMPHSABS 1.4  MONOABS 1.3*  EOSABS 0.1  BASOSABS 0.0  BANDABS --    Chemistries   Lab 04/24/11 0930 04/23/11 1739  NA -- 134*  K -- 3.3*  CL -- 95*  CO2 -- 25  GLUCOSE -- 122*  BUN -- 14  CREATININE -- 0.94  CALCIUM -- 9.1  MG 1.8 --    GFR Estimated Creatinine Clearance: 48.2 ml/min (by C-G formula based on Cr of 0.94).  Coagulation profile No results found for this basename: INR:5,PROTIME:5 in the last 168 hours  Cardiac Enzymes No results found for this basename: CK:3,CKMB:3,TROPONINI:3,MYOGLOBIN:3 in the last 168 hours  No results found for this basename: POCBNP:3 in the last 168 hours No results found for this basename: DDIMER:2 in the last 72 hours No results found for  this basename: HGBA1C:2 in the last 72 hours No results found for this basename: CHOL:2,HDL:2,LDLCALC:2,TRIG:2,CHOLHDL:2,LDLDIRECT:2 in the last 72 hours No results found for this basename: TSH,T4TOTAL,FREET3,T3FREE,THYROIDAB in the last 72 hours No results found for this basename: VITAMINB12:2,FOLATE:2,FERRITIN:2,TIBC:2,IRON:2,RETICCTPCT:2 in the last 72 hours No results found for this basename: LIPASE:2,AMYLASE:2 in the last 72 hours  Urine Studies No results found for this basename: UACOL:2,UAPR:2,USPG:2,UPH:2,UTP:2,UGL:2,UKET:2,UBIL:2,UHGB:2,UNIT:2,UROB:2,ULEU:2,UEPI:2,UWBC:2,URBC:2,UBAC:2,CAST:2,CRYS:2,UCOM:2,BILUA:2 in the last 72 hours  MICROBIOLOGY: No results found for this or any previous visit (from the past 240 hour(s)).  RADIOLOGY STUDIES/RESULTS: Dg Chest 2 View  04/23/2011  *RADIOLOGY REPORT*  Clinical Data: Cough, congestion and shortness of breath.  CHEST - 2 VIEW  Comparison: 02/13/2008.  Findings: Trachea is midline.  Heart is mildly enlarged, stable. Pacemaker and AICD lead tips are stable in position.  Thoracic aorta is calcified.  Probable linear scarring at the left lung base.  Lungs are otherwise clear.  No pleural fluid.  IMPRESSION: No acute findings.  Original Report Authenticated By: Reyes Ivan, M.D.    MEDICATIONS: Scheduled Meds:   . acetaminophen  650 mg Oral Once  . albuterol  5 mg Nebulization Once  .  amiodarone  100 mg Oral Daily  . aspirin  81 mg Oral Daily  . azithromycin  250 mg Oral Daily  . azithromycin  500 mg Oral Once  . carvedilol  6.25 mg Oral BID WC  . cefTRIAXone (ROCEPHIN) IV  1 g Intravenous Once  . cefTRIAXone (ROCEPHIN) IVPB 1 gram/50 mL D5W  1 g Intravenous Q24H  . clopidogrel  75 mg Oral QHS  . enoxaparin  40 mg Subcutaneous Q24H  . hydrochlorothiazide  12.5 mg Oral Daily  . ipratropium  0.5 mg Nebulization Once  . latanoprost  1 drop Left Eye QHS  . levalbuterol  0.63 mg Nebulization Q6H  . levothyroxine  50 mcg Oral  Q0600  . lisinopril  10 mg Oral Daily  . multivitamins ther. w/minerals  1 tablet Oral Daily  . predniSONE  50 mg Oral QAC breakfast  . rosuvastatin  10 mg Oral QHS  . sodium chloride  3 mL Intravenous Q12H  . DISCONTD: albuterol  2.5 mg Nebulization Q6H  . DISCONTD: cefTRIAXone (ROCEPHIN) IM  1 g Intramuscular Q24H  . DISCONTD: clopidogrel  75 mg Oral Q breakfast  . DISCONTD: lisinopril-hydrochlorothiazide  1 tablet Oral Daily   Continuous Infusions:   . sodium chloride     PRN Meds:.acetaminophen, acetaminophen, albuterol, alum & mag hydroxide-simeth, HYDROcodone-acetaminophen, ondansetron (ZOFRAN) IV, ondansetron, sodium chloride  Antibiotics: Anti-infectives     Start     Dose/Rate Route Frequency Ordered Stop   04/24/11 1700   cefTRIAXone (ROCEPHIN) 1 g in dextrose 5 % 50 mL IVPB        1 g 100 mL/hr over 30 Minutes Intravenous Every 24 hours 04/23/11 2349     04/24/11 1000   azithromycin (ZITHROMAX) tablet 250 mg        250 mg Oral Daily 04/23/11 2319     04/24/11 0800   cefTRIAXone (ROCEPHIN) injection 1 g  Status:  Discontinued        1 g Intramuscular Every 24 hours 04/23/11 2319 04/23/11 2348   04/23/11 1730   azithromycin (ZITHROMAX) tablet 500 mg        500 mg Oral  Once 04/23/11 1725 04/23/11 1811   04/23/11 1730   cefTRIAXone (ROCEPHIN) 1 g in dextrose 5 % 50 mL IVPB        1 g 100 mL/hr over 30 Minutes Intravenous  Once 04/23/11 1725 04/23/11 1846          Assessment/Plan: Patient Active Hospital Problem List: Hypoxia-secondary to acute bronchitis/? Underlying COPD (long history of smoking)    Assessment: This is significantly better.likely has underlying reactive airway disease-apparently a long history of smoking but quit in 1980s.    Plan: Change albuterol to Xopenex, add low-dose steroids. Continue to monitor. Does not seem to be volume overloaded clinically so doubt CHF. Continue empiric antibiotics.  HYPERLIPIDEMIA   Assessment: Stable    Plan:  Continue Crestor   GLAUCOMA    Assessment: Stable    Plan: Continue with latanoprost   MYOCARDIAL INFARCTION, HX OF    Assessment: No chest pain, stable    Plan: Continued aspirin   CARDIOMYOPATHY, ISCHEMIC    Assessment: Chronic systolic dysfunction, currently compensated.    Plan: Continue with lisinopril and and Coreg. He clinically is euvolemic. Does not take Lasix as an outpatient. Continue to monitor for now.  Atrial fibrillation    Assessment: Stable    Plan: Has a underlying paced rhythm. Previously was on Coumadin, apparently per patient this was discontinued  a couple of years ago as she had hematuria.   OSTEOARTHRITIS    Assessment: Stable    Plan: As needed Tylenol or NSAIDs.  Hypertension   Assessment: Controlled   Plan: Continue with the lisinopril, Coreg and hydrochlorothiazide.  Hypothyroidism   Assessment: Stable   Plan: Continue with levothyroxine  Disposition: Remain inpatient  DVT Prophylaxis: On prophylactic Lovenox  Code Status: Full code  Maretta Bees, MD. 04/24/2011, 3:37 PM

## 2011-04-24 NOTE — Progress Notes (Signed)
Pt had 8 beat run of Vtach. Hx of Vtach and afib. Pulse and bp were stable. [facility] notified. New orders received. Mechele Collin

## 2011-04-24 NOTE — Progress Notes (Signed)
Pt's heart monitor continues to signal alarms from central monitoring.  On further assessment, the tele readings appear stable, with pacer spikes and some PVCs, but negative for any asystole, pauses, or tachycardia as the central monitoring is reading.  Pt is alert and awake through, with stable vital signs.  No documentation found of pt having a pacemaker, but pt states she had one implanted about five years ago, and states her MD says she "uses it often".  Tele box was changed with no results, and tele lead set also changed.  NP on call was notified of alarms and little correlation to readings, and NP had no new orders except to add parameters to Coreg.  Pt has refused meds tonight, stating it is "too late, too close to my doses in the morning".  Also refused blood draws, stating they were "not needed".  MD also notified of this.  RRT RN called to assess tele readings and offer suggestions.  Will continue to monitor.  Angelique Blonder, RN

## 2011-04-25 LAB — BASIC METABOLIC PANEL
CO2: 33 mEq/L — ABNORMAL HIGH (ref 19–32)
Calcium: 9.3 mg/dL (ref 8.4–10.5)
Chloride: 95 mEq/L — ABNORMAL LOW (ref 96–112)
Creatinine, Ser: 1.11 mg/dL — ABNORMAL HIGH (ref 0.50–1.10)
Glucose, Bld: 190 mg/dL — ABNORMAL HIGH (ref 70–99)

## 2011-04-25 LAB — CBC
HCT: 38.7 % (ref 36.0–46.0)
Hemoglobin: 12.2 g/dL (ref 12.0–15.0)
MCH: 29 pg (ref 26.0–34.0)
MCV: 92.1 fL (ref 78.0–100.0)
Platelets: 177 10*3/uL (ref 150–400)
RBC: 4.2 MIL/uL (ref 3.87–5.11)
WBC: 8.1 10*3/uL (ref 4.0–10.5)

## 2011-04-25 MED ORDER — POTASSIUM CHLORIDE CRYS ER 20 MEQ PO TBCR
40.0000 meq | EXTENDED_RELEASE_TABLET | Freq: Once | ORAL | Status: AC
  Administered 2011-04-25: 40 meq via ORAL
  Filled 2011-04-25: qty 2

## 2011-04-25 MED ORDER — PREDNISONE 20 MG PO TABS
40.0000 mg | ORAL_TABLET | Freq: Every day | ORAL | Status: DC
  Administered 2011-04-26: 40 mg via ORAL
  Filled 2011-04-25 (×2): qty 2

## 2011-04-25 NOTE — Progress Notes (Signed)
Pt interviewed for home oxygen. Pt states she will not wear oxygen if she has to have tubing following her around. She uses her walker and is afraid she will fall. She is interested in portable tank she can carry on her shoulders. Contacted [facility] to discuss with pt and family concerns about tubing. She plans to stay with her grand-daughter for a few weeks then move home and wanted to also know if [facility] could accommodate her request. Pt gave permission for agency and CM to speak to her grand-daughter, April Long, cell [phone], work #[phone].

## 2011-04-25 NOTE — Progress Notes (Signed)
Physical Therapy Evaluation Patient Details Name: [name] MRN: [id number] DOB: [birth year] Today's Date: 04/25/2011  Problem List:  Patient Active Problem List  Diagnoses  . HYPERLIPIDEMIA  . GLAUCOMA  . MYOCARDIAL INFARCTION, HX OF  . CARDIOMYOPATHY, ISCHEMIC  . LBBB  . Atrial fibrillation  . Ventricular fibrillation  . AAA  . OSTEOARTHRITIS  . UTERINE CANCER, HX OF  . ICD - IN SITU  . Hypoxia    Past Medical History:  Past Medical History  Diagnosis Date  . Atrial fibrillation   . Hyperlipidemia   . Myocardial infarction   . Hyperlipidemia   . Osteoarthritis   . ICD (implantable cardiac defibrillator) in place     St. Jude  . LBBB (left bundle branch block)   . Ventricular tachycardia   . Cardiomyopathy   . Glaucoma   . History of uterine cancer   . History of total hysterectomy   . Coronary artery disease   . Hypertension    Past Surgical History:  Past Surgical History  Procedure Date  . Coronary artery bypass graft 1995  . Cataract extraction   . Cholecystectomy   . Coronary angioplasty   . Hernia repair   . Coronary angioplasty with stent placement   . Cardiac defibrillator placement   . Cardiac defibrillator placement     PT Assessment/Plan/Recommendation PT Assessment Clinical Impression Statement: Spoke with pt at length about home safety and breathing techniiques to improve lung function and activity tolerance.   PT Recommendation/Assessment: Patent does not need any further PT services No Skilled PT: Patient is independent with all acitivity/mobility (Per pt.  Declined to participate in PT.  ) PT Goals     PT Evaluation Precautions/Restrictions  Restrictions Weight Bearing Restrictions: No Prior Functioning  Home Living Lives With: Alone Receives Help From: Family (Son available if needed) Type of Home: House Home Layout: One level Home Access: Stairs to enter Entrance Stairs-Rails: Right;Left;Can reach both Entrance  Stairs-Number of Steps: 3 Prior Function Level of Independence: Independent with basic ADLs;Independent with homemaking with ambulation;Independent with gait;Independent with transfers Able to Take Stairs?: Yes Driving: Yes Vocation: Retired Leisure: Hobbies-yes (Comment) Comments: Multiple hobbies  Cognition Cognition Arousal/Alertness: Awake/alert Overall Cognitive Status: Appears within functional limits for tasks assessed Sensation/Coordination   Extremity Assessment   Mobility (including Balance)      Exercise    End of Session PT - End of Session Patient left: in bed;with call bell in reach  Daphne L. Jailani Hogans DPT [phone] 04/25/2011, 12:59 PM

## 2011-04-25 NOTE — Progress Notes (Signed)
PATIENT DETAILS Name: [name] Age: 77 y.o. Sex: female Date of Birth: July 29, 1921 Admit Date: 04/23/2011 WUJ:[id number] Tower, MD, MD  Subjective: Better-improving slowly  Objective: Vital signs in last 24 hours: Filed Vitals:   04/24/11 2106 04/24/11 2137 04/25/11 0313 04/25/11 0548  BP:  121/72  110/63  Pulse:  78  72  Temp:  97.6 F (36.4 C)  97.5 F (36.4 C)  TempSrc:  Oral  Oral  Resp:  20  18  Height:      Weight:      SpO2: 95% 94% 94% 91%    Weight change:   Body mass index is 38.71 kg/(m^2).  Intake/Output from previous day:  Intake/Output Summary (Last 24 hours) at 04/25/11 1153 Last data filed at 04/25/11 0913  Gross per 24 hour  Intake    410 ml  Output      0 ml  Net    410 ml    PHYSICAL EXAM: Gen Exam: Awake and alert with clear speech.  Neck: Supple, No JVD.   Chest:Good B/L air entry. Scattered rhonchi  CVS: S1 S2 Regular, no murmurs.  Abdomen: soft, BS +, non tender, non distended.  Extremities: no edema, lower extremities warm to touch. Neurologic: Non Focal.   Skin: No Rash.   Wounds: N/A.    CONSULTS:  none  LAB RESULTS: CBC  Lab 04/25/11 0950 04/23/11 1739  WBC 8.1 9.9  HGB 12.2 13.8  HCT 38.7 40.9  PLT 177 139*  MCV 92.1 91.7  MCH 29.0 30.9  MCHC 31.5 33.7  RDW 13.1 13.5  LYMPHSABS -- 1.4  MONOABS -- 1.3*  EOSABS -- 0.1  BASOSABS -- 0.0  BANDABS -- --    Chemistries   Lab 04/25/11 0950 04/24/11 0930 04/23/11 1739  NA 137 -- 134*  K 3.4* -- 3.3*  CL 95* -- 95*  CO2 33* -- 25  GLUCOSE 190* -- 122*  BUN 20 -- 14  CREATININE 1.11* -- 0.94  CALCIUM 9.3 -- 9.1  MG -- 1.8 --    GFR Estimated Creatinine Clearance: 40.8 ml/min (by C-G formula based on Cr of 1.11).  Coagulation profile No results found for this basename: INR:5,PROTIME:5 in the last 168 hours  Cardiac Enzymes No results found for this basename: CK:3,CKMB:3,TROPONINI:3,MYOGLOBIN:3 in the last 168 hours   Lab 04/25/11 0950  POCBNP 1089.0*     No results found for this basename: DDIMER:2 in the last 72 hours No results found for this basename: HGBA1C:2 in the last 72 hours No results found for this basename: CHOL:2,HDL:2,LDLCALC:2,TRIG:2,CHOLHDL:2,LDLDIRECT:2 in the last 72 hours No results found for this basename: TSH,T4TOTAL,FREET3,T3FREE,THYROIDAB in the last 72 hours No results found for this basename: VITAMINB12:2,FOLATE:2,FERRITIN:2,TIBC:2,IRON:2,RETICCTPCT:2 in the last 72 hours No results found for this basename: LIPASE:2,AMYLASE:2 in the last 72 hours  Urine Studies No results found for this basename: UACOL:2,UAPR:2,USPG:2,UPH:2,UTP:2,UGL:2,UKET:2,UBIL:2,UHGB:2,UNIT:2,UROB:2,ULEU:2,UEPI:2,UWBC:2,URBC:2,UBAC:2,CAST:2,CRYS:2,UCOM:2,BILUA:2 in the last 72 hours  MICROBIOLOGY: No results found for this or any previous visit (from the past 240 hour(s)).  RADIOLOGY STUDIES/RESULTS: Dg Chest 2 View  04/23/2011  *RADIOLOGY REPORT*  Clinical Data: Cough, congestion and shortness of breath.  CHEST - 2 VIEW  Comparison: 02/13/2008.  Findings: Trachea is midline.  Heart is mildly enlarged, stable. Pacemaker and AICD lead tips are stable in position.  Thoracic aorta is calcified.  Probable linear scarring at the left lung base.  Lungs are otherwise clear.  No pleural fluid.  IMPRESSION: No acute findings.  Original Report Authenticated By: Reyes Ivan, M.D.  MEDICATIONS: Scheduled Meds:    . amiodarone  100 mg Oral Daily  . aspirin  81 mg Oral Daily  . azithromycin  250 mg Oral Daily  . carvedilol  6.25 mg Oral BID WC  . cefTRIAXone (ROCEPHIN) IVPB 1 gram/50 mL D5W  1 g Intravenous Q24H  . clopidogrel  75 mg Oral QHS  . enoxaparin  40 mg Subcutaneous Q24H  . hydrochlorothiazide  12.5 mg Oral Daily  . latanoprost  1 drop Left Eye QHS  . levalbuterol  0.63 mg Nebulization Q6H  . levothyroxine  50 mcg Oral Q0600  . lisinopril  10 mg Oral Daily  . multivitamins ther. w/minerals  1 tablet Oral Daily  . predniSONE  40  mg Oral QAC breakfast  . rosuvastatin  10 mg Oral QHS  . DISCONTD: albuterol  2.5 mg Nebulization Q6H  . DISCONTD: predniSONE  50 mg Oral QAC breakfast  . DISCONTD: sodium chloride  3 mL Intravenous Q12H   Continuous Infusions:    . DISCONTD: sodium chloride     PRN Meds:.acetaminophen, acetaminophen, albuterol, alum & mag hydroxide-simeth, HYDROcodone-acetaminophen, ondansetron (ZOFRAN) IV, ondansetron, DISCONTD: sodium chloride  Antibiotics: Anti-infectives     Start     Dose/Rate Route Frequency Ordered Stop   04/24/11 1700   cefTRIAXone (ROCEPHIN) 1 g in dextrose 5 % 50 mL IVPB        1 g 100 mL/hr over 30 Minutes Intravenous Every 24 hours 04/23/11 2349     04/24/11 1000   azithromycin (ZITHROMAX) tablet 250 mg        250 mg Oral Daily 04/23/11 2319     04/24/11 0800   cefTRIAXone (ROCEPHIN) injection 1 g  Status:  Discontinued        1 g Intramuscular Every 24 hours 04/23/11 2319 04/23/11 2348   04/23/11 1730   azithromycin (ZITHROMAX) tablet 500 mg        500 mg Oral  Once 04/23/11 1725 04/23/11 1811   04/23/11 1730   cefTRIAXone (ROCEPHIN) 1 g in dextrose 5 % 50 mL IVPB        1 g 100 mL/hr over 30 Minutes Intravenous  Once 04/23/11 1725 04/23/11 1846          Assessment/Plan: Patient Active Hospital Problem List: Hypoxia-secondary to acute bronchitis/? Underlying COPD (long history of smoking)    Assessment: Continues to improve, likely has reactive airway disease and possibly undiagnosed COPD    Plan: Continue with Xopenex, decrease prednisone to 40 mg daily. Continue to monitor. Does not seem to be volume overloaded clinically so doubt CHF. Continue empiric antibiotics-Rocephin and Zithromax.  HYPERLIPIDEMIA   Assessment: Stable    Plan: Continue Crestor   GLAUCOMA    Assessment: Stable    Plan: Continue with latanoprost   MYOCARDIAL INFARCTION, HX OF    Assessment: No chest pain, stable    Plan: Continued aspirin   CARDIOMYOPATHY, ISCHEMIC     Assessment: Chronic systolic dysfunction, currently compensated.    Plan: Continue with lisinopril and and Coreg. She is clinically euvolemic. BNP 1089 only. Does not take Lasix as an outpatient. Continue to monitor for now.  Atrial fibrillation    Assessment: Stable . Telemetry revealed, paced rhythm. Lowest heart rate in the 50s    Plan: Has a underlying paced rhythm. Previously was on Coumadin, apparently per patient this was discontinued a couple of years ago as she had hematuria.   OSTEOARTHRITIS    Assessment: Stable    Plan: As needed  Tylenol or NSAIDs.  Hypertension   Assessment: Controlled   Plan: Continue with the lisinopril, Coreg and hydrochlorothiazide.  Hypothyroidism   Assessment: Stable   Plan: Continue with levothyroxine  Disposition: Remain inpatient  DVT Prophylaxis: On prophylactic Lovenox  Code Status: Full code  Maretta Bees, MD. 04/25/2011, 11:53 AM

## 2011-04-25 NOTE — Progress Notes (Signed)
Patient is being discharged from PT/ OT/ SLP services secondary to:  Pt declined to participate in PT evaluation stating "I don't need any therapy".   Acute PT signing off.   Carder Yin L. Staton Markey DPT [phone]

## 2011-04-25 NOTE — Plan of Care (Signed)
Problem: Phase II Progression Outcomes Goal: Wean O2 if indicated Outcome: Not Applicable Date Met:  04/25/11 Will go home on O2

## 2011-04-25 NOTE — Progress Notes (Signed)
Ambulated patient in hall on room air O2 sat dropped to 87%. Before walking on room air 92%. After returning to room and put back on O2 sat was 98%.   Charley Lafrance Consuella Lose 04/25/2011  4:58 PM

## 2011-04-26 ENCOUNTER — Inpatient Hospital Stay (HOSPITAL_COMMUNITY): Payer: Medicare Other

## 2011-04-26 LAB — GLUCOSE, CAPILLARY: Glucose-Capillary: 112 mg/dL — ABNORMAL HIGH (ref 70–99)

## 2011-04-26 MED ORDER — PREDNISONE 5 MG PO TABS: 5.0000 mg | ORAL_TABLET | Freq: Every day | ORAL | Status: AC

## 2011-04-26 MED ORDER — INSULIN ASPART 100 UNIT/ML ~~LOC~~ SOLN
0.0000 [IU] | Freq: Three times a day (TID) | SUBCUTANEOUS | Status: DC
  Filled 2011-04-26: qty 3

## 2011-04-26 MED ORDER — TIOTROPIUM BROMIDE MONOHYDRATE 18 MCG IN CAPS
18.0000 ug | ORAL_CAPSULE | Freq: Every day | RESPIRATORY_TRACT | Status: DC
  Filled 2011-04-26: qty 5

## 2011-04-26 MED ORDER — LEVALBUTEROL TARTRATE 45 MCG/ACT IN AERO: 2.0000 | INHALATION_SPRAY | Freq: Four times a day (QID) | RESPIRATORY_TRACT | Status: DC | PRN

## 2011-04-26 MED ORDER — TIOTROPIUM BROMIDE MONOHYDRATE 18 MCG IN CAPS: 18.0000 ug | ORAL_CAPSULE | Freq: Every day | RESPIRATORY_TRACT | Status: DC

## 2011-04-26 MED ORDER — POTASSIUM CHLORIDE CRYS ER 20 MEQ PO TBCR
20.0000 meq | EXTENDED_RELEASE_TABLET | Freq: Every day | ORAL | Status: DC
  Administered 2011-04-26: 20 meq via ORAL
  Filled 2011-04-26: qty 1

## 2011-04-26 MED ORDER — LEVALBUTEROL TARTRATE 45 MCG/ACT IN AERO
2.0000 | INHALATION_SPRAY | Freq: Four times a day (QID) | RESPIRATORY_TRACT | Status: DC | PRN
  Filled 2011-04-26: qty 15

## 2011-04-26 MED ORDER — MOXIFLOXACIN HCL 400 MG PO TABS: 400.0000 mg | ORAL_TABLET | Freq: Every day | ORAL | Status: AC

## 2011-04-26 MED ORDER — INSULIN ASPART 100 UNIT/ML ~~LOC~~ SOLN
0.0000 [IU] | Freq: Every day | SUBCUTANEOUS | Status: DC
  Filled 2011-04-26: qty 3

## 2011-04-26 NOTE — Progress Notes (Signed)
Subjective: "I'm ready to get out of here"  I'm going to stay with my granddaughter. No complaints.  Pt being taken down to pulmonary function testing.  Objective:  Vital signs in last 24 hours:  Filed Vitals:   04/25/11 2115 04/25/11 2124 04/26/11 0252 04/26/11 0543  BP: 115/74   105/58  Pulse: 89   81  Temp: 97.8 F (36.6 C)   98 F (36.7 C)  TempSrc: Oral   Oral  Resp: 17   18  Height:      Weight:      SpO2: 96% 97% 96% 94%    Intake/Output from previous day:  Intake/Output Summary (Last 24 hours) at 04/26/11 1013 Last data filed at 04/26/11 0847  Gross per 24 hour  Intake    650 ml  Output      0 ml  Net    650 ml    Physical Exam: Gen Exam: Awake and alert with clear speech. Good Humor. Neck: Supple, No JVD.  Chest:Good B/L air entry. Wheeze bi-laterally. CVS: S1 S2 Regular, no murmurs.  Abdomen: soft, BS +, non tender, non distended.  Extremities: no edema, lower extremities warm to touch.  Neurologic: Non Focal.  Skin: No Rash.    Lab Results:  Basic Metabolic Panel:    Component Value Date/Time   NA 137 04/25/2011 0950   K 3.4* 04/25/2011 0950   CL 95* 04/25/2011 0950   CO2 33* 04/25/2011 0950   BUN 20 04/25/2011 0950   CREATININE 1.11* 04/25/2011 0950   GLUCOSE 190* 04/25/2011 0950   CALCIUM 9.3 04/25/2011 0950   CBC:    Component Value Date/Time   WBC 8.1 04/25/2011 0950   HGB 12.2 04/25/2011 0950   HCT 38.7 04/25/2011 0950   PLT 177 04/25/2011 0950   MCV 92.1 04/25/2011 0950   NEUTROABS 7.1 04/23/2011 1739   LYMPHSABS 1.4 04/23/2011 1739   MONOABS 1.3* 04/23/2011 1739   EOSABS 0.1 04/23/2011 1739   BASOSABS 0.0 04/23/2011 1739      Lab 04/25/11 0950 04/23/11 1739  WBC 8.1 9.9  HGB 12.2 13.8  HCT 38.7 40.9  PLT 177 139*  MCV 92.1 91.7  MCH 29.0 30.9  MCHC 31.5 33.7  RDW 13.1 13.5  LYMPHSABS -- 1.4  MONOABS -- 1.3*  EOSABS -- 0.1  BASOSABS -- 0.0  BANDABS -- --    Lab 04/25/11 0950 04/24/11 0930 04/23/11 1739  NA 137  -- 134*  K 3.4* -- 3.3*  CL 95* -- 95*  CO2 33* -- 25  GLUCOSE 190* -- 122*  BUN 20 -- 14  CREATININE 1.11* -- 0.94  CALCIUM 9.3 -- 9.1  MG -- 1.8 --   No results found for this basename: INR:5,PROTIME:5 in the last 168 hours Cardiac markers: No results found for this basename: CK:3,CKMB:3,TROPONINI:3,MYOGLOBIN:3 in the last 168 hours  Lab 04/25/11 0950  POCBNP 1089.0*   No results found for this or any previous visit (from the past 240 hour(s)).  Studies/Results: No results found.  Medications: Scheduled Meds:   . amiodarone  100 mg Oral Daily  . aspirin  81 mg Oral Daily  . azithromycin  250 mg Oral Daily  . carvedilol  6.25 mg Oral BID WC  . cefTRIAXone (ROCEPHIN) IVPB 1 gram/50 mL D5W  1 g Intravenous Q24H  . clopidogrel  75 mg Oral QHS  . enoxaparin  40 mg Subcutaneous Q24H  . hydrochlorothiazide  12.5 mg Oral Daily  . latanoprost  1 drop Left  Eye QHS  . levalbuterol  0.63 mg Nebulization Q6H  . levothyroxine  50 mcg Oral Q0600  . lisinopril  10 mg Oral Daily  . multivitamins ther. w/minerals  1 tablet Oral Daily  . potassium chloride  40 mEq Oral Once  . predniSONE  40 mg Oral QAC breakfast  . rosuvastatin  10 mg Oral QHS   Continuous Infusions:  PRN Meds:.acetaminophen, acetaminophen, albuterol, alum & mag hydroxide-simeth, HYDROcodone-acetaminophen, ondansetron (ZOFRAN) IV, ondansetron  Assessment/Plan:  Principal Problem:  *Hypoxia Active Problems:  HYPERLIPIDEMIA  GLAUCOMA  MYOCARDIAL INFARCTION, HX OF  CARDIOMYOPATHY, ISCHEMIC  Atrial fibrillation  OSTEOARTHRITIS  Assessment/Plan:  Patient Active Hospital Problem List: Hypoxia-secondary to acute bronchitis/? Underlying COPD (long history of smoking)  Assessment: Continues to improve, likely has reactive airway disease and possibly undiagnosed COPD  Plan: Continue with Xopenex, decrease prednisone to 40 mg daily. Continue to monitor. Does not seem to be volume overloaded clinically so doubt CHF.  Continue empiric antibiotics-Rocephin and Zithromax. Day 3  HYPERLIPIDEMIA Assessment: Stable  Plan: Continue Crestor   GLAUCOMA  Assessment: Stable  Plan: Continue with latanoprost   MYOCARDIAL INFARCTION, HX OF  Assessment: No chest pain, stable  Plan: Continued aspirin   CARDIOMYOPATHY, ISCHEMIC  Assessment: Chronic systolic dysfunction, currently compensated.  Plan: Continue with lisinopril and and Coreg. She is clinically euvolemic. BNP 1089 only. Does not take Lasix as an outpatient. Continue to monitor for now.   Atrial fibrillation  Assessment: Stable . Telemetry revealed, paced rhythm. Lowest heart rate in the 50s  Plan: Has a underlying paced rhythm. Previously was on Coumadin, apparently per patient this was discontinued a couple of years ago as she had hematuria.   OSTEOARTHRITIS  Assessment: Stable  Plan: As needed Tylenol or NSAIDs.   Hypertension  Assessment: Controlled  Plan: Continue with the lisinopril, Coreg and hydrochlorothiazide.   Hypothyroidism  Assessment: Stable  Plan: Continue with levothyroxine   Mild hypokalemia: replete  Mild hyperglycemia, secondary to steroids. Add sliding scale insulin-sensitive  Disposition:  Discharge to Daughter/Grandaughter's home likely 11/27.  Home with Oxygen.  DVT Prophylaxis:  On prophylactic Lovenox   Code Status:  Full code     LOS: 3 days   YORK,Shirel Mallis L 04/26/2011, 10:13 AM

## 2011-04-26 NOTE — Progress Notes (Signed)
I have seen and examined this patient and I agree with the above assessment and plan. However for further details please see below.  Patient is significantly better, a PFT was basically reviewed with Dr. Craige Cotta over the phone. He suggested outpatient pulmonary followup. This has been arranged with Dr. Delford Field. Patient lung exam compared to the past few days is significantly better with only minimal wheezing and she seems much more comfortable. We'll plan to discharge with tapering steroids, Xopenex inhaler and Spiriva. She will use oxygen till she sees Dr. Delford Field of her primary care practitioner.

## 2011-04-26 NOTE — Discharge Summary (Signed)
Patient ID: [name] MRN: [id number] DOB/AGE: 1922/01/26 77 y.o.  Admit date: 04/23/2011 Discharge date: 04/26/2011  Primary Care Physician:  Roxy Manns, MD, MD  Discharge Diagnoses:    Present on Admission:  .Hypoxia .HYPERLIPIDEMIA .GLAUCOMA .CARDIOMYOPATHY, ISCHEMIC, s/p pacemaker .Atrial fibrillation .OSTEOARTHRITIS *History of Myocardial Infarction   Current Discharge Medication List    START taking these medications   Details  levalbuterol (XOPENEX HFA) 45 MCG/ACT inhaler Inhale 2 puffs into the lungs every 6 (six) hours as needed for wheezing or shortness of breath. Qty: 1 Inhaler, Refills: 0    moxifloxacin (AVELOX) 400 MG tablet Take 1 tablet (400 mg total) by mouth daily. Qty: 4 tablet, Refills: 0    predniSONE (DELTASONE) 5 MG tablet Take 1 tablet (5 mg total) by mouth daily. Take 7 tabs on 11/27 then decrease by one tablet daily  (ex. Take 6 tabs on 11/28) until complete. Qty: 28 tablet, Refills: 0    tiotropium (SPIRIVA) 18 MCG inhalation capsule Place 1 capsule (18 mcg total) into inhaler and inhale daily. Qty: 30 capsule, Refills: 0      CONTINUE these medications which have NOT CHANGED   Details  amiodarone (PACERONE) 200 MG tablet Take 100 mg by mouth daily.      aspirin 81 MG tablet Take 81 mg by mouth daily.     carvedilol (COREG) 6.25 MG tablet Take 6.25 mg by mouth 2 (two) times daily with a meal.      clopidogrel (PLAVIX) 75 MG tablet Take 75 mg by mouth daily.     latanoprost (XALATAN) 0.005 % ophthalmic solution Place 1 drop into the left eye at bedtime.      levothyroxine (SYNTHROID, LEVOTHROID) 50 MCG tablet Take 50 mcg by mouth daily.      lisinopril-hydrochlorothiazide (PRINZIDE,ZESTORETIC) 10-12.5 MG per tablet Take 1 tablet by mouth daily. Qty: 90 tablet, Refills: 4    Multiple Vitamins-Minerals (MULTIVITAMINS THER. W/MINERALS) TABS Take 1 tablet by mouth daily.      Polyethyl Glycol-Propyl Glycol (SYSTANE OP) Apply 1  drop to eye at bedtime.      rosuvastatin (CRESTOR) 10 MG tablet Take 10 mg by mouth at bedtime.         Consults:  None  Brief H and P:  From the admission note: Ms. [name] Presents with approximately 5 days of predominantly cough. Green sputum production. Mild shortness of breath, but navigates within house with walker without having to stop for breathlessness. Denies sick contacts, particularly the flu. Has felt like a low grade temp, checked it early and was 99. No body aches. Went to see PMD today and O2 sat was 82% so referred to the ED for further evaluation. Reports generally fairly good health without a lot of respiratory issues. No recent prolonged travel or down time. She states multiple times that she feels generally "ok", that it is just the cough she can not take any longer. Has tried OTC expectorant without improvement in her symptoms.  Hypoxia: Bacterial bronchitis vs Atypical PNA with a component of chronic lung disease.  Patient was admitted provided with broad spectrum antibiotics, Azithromycin and Ceftriaxone. Supportive therapy with Tylenol, q 6 hr Albuterol Nebulizer, and Robitussin DM.  The patient improved significantly over night but continued to have episodes of hypoxia particularly when trying to ambulate.  Low dose steroids were added and a physical therapy consult was requested.  The patient declined to participate in physical therapy, stating she didn't need it.  The patient did ambulate  with the Nursing staff and her O2 sat continued to drop with movement.  Oxygen therapy is recommended as an out patient.  On 11/26 Pulmonary Function Testing was conducted. The results were discussed with PCCM and out patient pulmonology follow up was recommended.  The patient has a follow up appointment with Dr. Delford Field Scheduled.  She will be discharged on home Oxygen, Spiriva, Xopenex inhaler, prednisone taper and 4 more days of Avelox.  The rest of her discharge diagnoses were stable  and quiet during this hospitalization.  HYPERLIPIDEMIA Assessment: Stable  Plan: Continue Crestor   GLAUCOMA  Assessment: Stable  Plan: Continue with latanoprost   MYOCARDIAL INFARCTION, HX OF  Assessment: No chest pain, stable  Plan: Continued aspirin   CARDIOMYOPATHY, ISCHEMIC  Assessment: Chronic systolic dysfunction, currently compensated.  Plan: Continue with lisinopril and and Coreg. She is clinically euvolemic. BNP 1089 only. Does not take Lasix as an outpatient.   ATRIAL FIBRILLATION Assessment: Stable . Telemetry revealed, paced rhythm. Lowest heart rate in the 50s  Plan: Has a underlying paced rhythm. Previously was on Coumadin, apparently per patient this was discontinued a couple of years ago as she had hematuria.   OSTEOARTHRITIS Assessment: Stable  Plan: As needed Tylenol or NSAIDs.   HYPERTENSION  Assessment: Controlled  Plan: Continue with the lisinopril, Coreg and hydrochlorothiazide.   HYPOTHYROIDISM Assessment: Stable  Plan: Continue with levothyroxine   MILD HYPOKALEMIA: repleted with Kdur   MILD HYPERGLYCEMIA:  secondary to steroids. Add sliding scale insulin-sensitive   Physical Exam on Discharge:  Gen Exam: Awake and alert with clear speech. Good Humor.  Neck: Supple, No JVD.  Chest:Good B/L air entry. Wheeze bi-laterally.  CVS: S1 S2 Regular, no murmurs.  Abdomen: soft, BS +, non tender, non distended.  Extremities: no edema, lower extremities warm to touch.  Neurologic: Non Focal.  Skin: No Rash.   Filed Vitals:   04/25/11 2115 04/25/11 2124 04/26/11 0252 04/26/11 0543  BP: 115/74   105/58  Pulse: 89   81  Temp: 97.8 F (36.6 C)   98 F (36.7 C)  TempSrc: Oral   Oral  Resp: 17   18  Height:      Weight:      SpO2: 96% 97% 96% 94%    Intake/Output Summary (Last 24 hours) at 04/26/11 1447 Last data filed at 04/26/11 0847  Gross per 24 hour  Intake    530 ml  Output      0 ml  Net    530 ml    CBC:    Component Value  Date/Time   WBC 8.1 04/25/2011 0950   HGB 12.2 04/25/2011 0950   HCT 38.7 04/25/2011 0950   PLT 177 04/25/2011 0950   MCV 92.1 04/25/2011 0950   NEUTROABS 7.1 04/23/2011 1739   LYMPHSABS 1.4 04/23/2011 1739   MONOABS 1.3* 04/23/2011 1739   EOSABS 0.1 04/23/2011 1739   BASOSABS 0.0 04/23/2011 1739    Basic Metabolic Panel:    Component Value Date/Time   NA 137 04/25/2011 0950   K 3.4* 04/25/2011 0950   CL 95* 04/25/2011 0950   CO2 33* 04/25/2011 0950   BUN 20 04/25/2011 0950   CREATININE 1.11* 04/25/2011 0950   GLUCOSE 190* 04/25/2011 0950   CALCIUM 9.3 04/25/2011 0950    Other Pertinent Lab Results:    Significant Diagnostic Studies:  Dg Chest 2 View  04/23/2011  *RADIOLOGY REPORT*  Clinical Data: Cough, congestion and shortness of breath.  CHEST - 2 VIEW  Comparison: 02/13/2008.  Findings: Trachea is midline.  Heart is mildly enlarged, stable. Pacemaker and AICD lead tips are stable in position.  Thoracic aorta is calcified.  Probable linear scarring at the left lung base.  Lungs are otherwise clear.  No pleural fluid.  IMPRESSION: No acute findings.  Original Report Authenticated By: Reyes Ivan, M.D.    Disposition and Follow-up:  1.  An appointment has been scheduled on 05/12/2011 at 9:30 AM with Dr. Shan Levans of [facility].  2.  We request that the patient make an appointment with her primary care physician, Dr. Milinda Antis, in one week.  Time spent on Discharge: 45 min  Signed: Stephani Police 04/26/2011, 2:47 PM

## 2011-04-26 NOTE — Discharge Summary (Signed)
Patient is significantly better, requests that she be discharged home today. Have arranged outpatient followup with pulmonology on December 7. She will followup with her primary care practitioner within one week as well. She will use oxygen till she see's Dr. Delford Field or her primary care practitioner. Agree with Ms. York assessment and plan.

## 2011-04-26 NOTE — Progress Notes (Signed)
04/26/11 NSG 1500 Pt. sats on RA was 91%.  Ambulated pt. and sats dropped to 85%.  Placed back on 2L of O2 and sats up to 94%.  Will continue to monitor.  Forbes Cellar, RN

## 2011-05-01 DIAGNOSIS — R0902 Hypoxemia: Secondary | ICD-10-CM

## 2011-05-01 HISTORY — DX: Hypoxemia: R09.02

## 2011-05-07 ENCOUNTER — Inpatient Hospital Stay: Payer: Medicare Other | Admitting: Critical Care Medicine

## 2011-05-07 ENCOUNTER — Encounter: Payer: Self-pay | Admitting: Family Medicine

## 2011-05-07 ENCOUNTER — Ambulatory Visit (INDEPENDENT_AMBULATORY_CARE_PROVIDER_SITE_OTHER): Payer: Medicare Other | Admitting: Family Medicine

## 2011-05-07 VITALS — BP 118/64 | HR 80 | Temp 98.1°F | Ht 63.0 in | Wt 215.8 lb

## 2011-05-07 DIAGNOSIS — J40 Bronchitis, not specified as acute or chronic: Secondary | ICD-10-CM

## 2011-05-07 DIAGNOSIS — R0902 Hypoxemia: Secondary | ICD-10-CM

## 2011-05-07 DIAGNOSIS — Z23 Encounter for immunization: Secondary | ICD-10-CM

## 2011-05-07 NOTE — Patient Instructions (Signed)
I'm glad you are doing better  I agree that you need to see the pulmonary doctor for low oxygen - but understand that you declined this , so it is important that you let me know if you have any shortness of breath or other symptoms  Finish your spiriva  Pneumonia vaccine today

## 2011-05-07 NOTE — Assessment & Plan Note (Signed)
This seems to be clinically resolved  (? Of bronchitis vs atypical pneumonia)-pt is symptom free and done with prednisone and abx  She will finish spiriva and refuses to take any more  Also refuses pulm consult Pneumovax today Had flu shot in oct  Adv to update if any symptoms return

## 2011-05-07 NOTE — Assessment & Plan Note (Signed)
Rev hosp records for her bronchitis/ reactive airways/ ? Atypical pneumonia  Pt was desaturating at night and with ambulation Recommended pulm f/u and night time 02 Pt absolutely refuses pulm consult/ disc of PFTs/ 02 or any further treatment -- will finish spiriva however and not refil She is adamant about this and I discussed the risks of hypoxia  Pt was reminded to call if symptoms return

## 2011-05-07 NOTE — Progress Notes (Signed)
Subjective:    Patient ID: [name], female    DOB: 1921/11/18, 77 y.o.   MRN: [id number]  HPI Here for f/u of hosp  Was hosp 11/23 - 11/26 for bronchitis (or atypical pneumonia ) with hypoxia and prod cough Was tx with nebs and empiric abx in hosp - and in resp to persistent hypoxia was also put on a steroid taper  D/c with avelox/ prednisone/ xopenex and spiriva  She had PFT- I do not have those results They did ID poss need for night time 02   (she took one cylander home and used it briefly - does not feel like she needed it )  Was to f/u with Dr Delford Field to disc this and her PFT results She did not do that - decided not to  Is on spiriva  Was a prior smoker --quit 28 years ago   Pt absolutely refuses any further oxygen/ pulmonary f/u or testing since she feels fine  At her age - she just does not want more done  Is somewhat resistant to pneumovax today  Patient Active Problem List  Diagnoses  . HYPERLIPIDEMIA  . GLAUCOMA  . MYOCARDIAL INFARCTION, HX OF  . CARDIOMYOPATHY, ISCHEMIC  . LBBB  . Atrial fibrillation  . Ventricular fibrillation  . AAA  . OSTEOARTHRITIS  . UTERINE CANCER, HX OF  . ICD - IN SITU  . Hypoxia  . Bronchitis   Past Medical History  Diagnosis Date  . Atrial fibrillation   . Hyperlipidemia   . Myocardial infarction   . Hyperlipidemia   . Osteoarthritis   . ICD (implantable cardiac defibrillator) in place     St. Jude  . LBBB (left bundle branch block)   . Ventricular tachycardia   . Cardiomyopathy   . Glaucoma   . History of uterine cancer   . History of total hysterectomy   . Coronary artery disease     hx of MI  . Hypertension   . Hypoxia 12/12    suspect copd but pt refuses any pulmonary work up or treatment   Past Surgical History  Procedure Date  . Coronary artery bypass graft 1995  . Cataract extraction   . Cholecystectomy   . Coronary angioplasty   . Hernia repair   . Coronary angioplasty with stent placement   .  Cardiac defibrillator placement   . Cardiac defibrillator placement   . Abdominal hysterectomy 1989    total   History  Substance Use Topics  . Smoking status: Former Games developer  . Smokeless tobacco: Not on file  . Alcohol Use: No   Family History  Problem Relation Age of Onset  . Heart attack Father   . Heart disease Father     MI  . Diverticulitis Mother   . Ovarian cancer Sister   . Cancer Sister     ovarian CA   No Known Allergies Current Outpatient Prescriptions on File Prior to Visit  Medication Sig Dispense Refill  . amiodarone (PACERONE) 200 MG tablet Take 100 mg by mouth daily.        Marland Kitchen aspirin 81 MG tablet Take 81 mg by mouth daily.       . carvedilol (COREG) 6.25 MG tablet Take 6.25 mg by mouth 2 (two) times daily with a meal.        . clopidogrel (PLAVIX) 75 MG tablet Take 75 mg by mouth daily.       Marland Kitchen latanoprost (XALATAN) 0.005 % ophthalmic solution Place 1  drop into the left eye at bedtime.        Marland Kitchen levothyroxine (SYNTHROID, LEVOTHROID) 50 MCG tablet Take 50 mcg by mouth daily.        Marland Kitchen lisinopril-hydrochlorothiazide (PRINZIDE,ZESTORETIC) 10-12.5 MG per tablet Take 1 tablet by mouth daily.  90 tablet  4  . Multiple Vitamins-Minerals (MULTIVITAMINS THER. W/MINERALS) TABS Take 1 tablet by mouth daily.        . rosuvastatin (CRESTOR) 10 MG tablet Take 10 mg by mouth at bedtime.       Marland Kitchen tiotropium (SPIRIVA) 18 MCG inhalation capsule Place 1 capsule (18 mcg total) into inhaler and inhale daily.  30 capsule  0  . levalbuterol (XOPENEX HFA) 45 MCG/ACT inhaler Inhale 2 puffs into the lungs every 6 (six) hours as needed for wheezing or shortness of breath.  1 Inhaler  0  . Polyethyl Glycol-Propyl Glycol (SYSTANE OP) Apply 1 drop to eye at bedtime.             Review of Systems Review of Systems  Constitutional: Negative for fever, appetite change, fatigue and unexpected weight change.  Eyes: Negative for pain and visual disturbance.  Respiratory: Negative for cough and  shortness of breath.   Cardiovascular: Negative for cp or palpitations    Gastrointestinal: Negative for nausea, diarrhea and constipation.  Genitourinary: Negative for urgency and frequency.  Skin: Negative for pallor or rash   Neurological: Negative for weakness, light-headedness, numbness and headaches.  Hematological: Negative for adenopathy. Does not bruise/bleed easily.  Psychiatric/Behavioral: Negative for dysphoric mood. The patient is not nervous/anxious.          Objective:   Physical Exam  Constitutional: She appears well-developed and well-nourished. No distress.       Overweight irritable elderly female- not in distress but irritable  HENT:  Head: Normocephalic and atraumatic.  Nose: Nose normal.  Mouth/Throat: Oropharynx is clear and moist.  Eyes: Conjunctivae and EOM are normal. Pupils are equal, round, and reactive to light. No scleral icterus.  Neck: Normal range of motion. Neck supple. No JVD present. Carotid bruit is not present. Erythema present. No thyromegaly present.  Cardiovascular: Normal rate, regular rhythm and normal heart sounds.  Exam reveals no gallop.   Pulmonary/Chest: Effort normal and breath sounds normal. No respiratory distress. She has no wheezes. She has no rales. She exhibits no tenderness.       Diffusely distant bs   Abdominal: Soft.  Musculoskeletal: She exhibits no edema.  Lymphadenopathy:    She has no cervical adenopathy.  Neurological: She is alert. She has normal reflexes. No cranial nerve deficit. She exhibits normal muscle tone. Coordination normal.  Skin: Skin is warm and dry. No rash noted. No erythema. No pallor.  Psychiatric: She has a normal mood and affect.       Mentally sharp but quite argumentative and irritable today            Assessment & Plan:

## 2011-05-19 ENCOUNTER — Telehealth: Payer: Self-pay | Admitting: Internal Medicine

## 2011-05-19 MED ORDER — ROSUVASTATIN CALCIUM 10 MG PO TABS: 10.0000 mg | ORAL_TABLET | Freq: Every day | ORAL | Status: DC

## 2011-05-19 NOTE — Telephone Encounter (Signed)
Addended by: Judithe Modest D on: 05/19/2011 10:01 AM   Modules accepted: Orders

## 2011-05-19 NOTE — Telephone Encounter (Signed)
New msg [facility] is calling about refill for crestor. They havent had a response.

## 2011-05-31 ENCOUNTER — Encounter: Payer: Self-pay | Admitting: Internal Medicine

## 2011-05-31 ENCOUNTER — Ambulatory Visit (INDEPENDENT_AMBULATORY_CARE_PROVIDER_SITE_OTHER): Payer: Medicare Other | Admitting: *Deleted

## 2011-05-31 DIAGNOSIS — I4901 Ventricular fibrillation: Secondary | ICD-10-CM

## 2011-05-31 DIAGNOSIS — Z95810 Presence of automatic (implantable) cardiac defibrillator: Secondary | ICD-10-CM

## 2011-05-31 DIAGNOSIS — I4891 Unspecified atrial fibrillation: Secondary | ICD-10-CM

## 2011-05-31 LAB — ICD DEVICE OBSERVATION
AL AMPLITUDE: 0.9 mv
AL THRESHOLD: 0.5 V
BATTERY VOLTAGE: 2.4214 V
DEVICE MODEL ICD: 437068
FVT: 0
HV IMPEDENCE: 48 Ohm
MODE SWITCH EPISODES: 318
PACEART VT: 0
RV LEAD THRESHOLD: 0.75 V
TOT-0009: 1
TOT-0010: 24
TZAT-0001FASTVT: 1
TZAT-0001SLOWVT: 1
TZAT-0012SLOWVT: 200 ms
TZAT-0013FASTVT: 2
TZAT-0013SLOWVT: 4
TZAT-0019SLOWVT: 7.5 V
TZAT-0020FASTVT: 1 ms
TZAT-0020SLOWVT: 1 ms
TZON-0004SLOWVT: 12
TZON-0005FASTVT: 6
TZON-0005SLOWVT: 6
TZON-0010FASTVT: 80 ms
TZST-0001FASTVT: 2
TZST-0001FASTVT: 5
TZST-0001SLOWVT: 4
TZST-0003FASTVT: 36 J
TZST-0003FASTVT: 36 J
TZST-0003SLOWVT: 25 J

## 2011-05-31 NOTE — Progress Notes (Signed)
icd check in clinic  

## 2011-06-10 ENCOUNTER — Other Ambulatory Visit: Payer: Self-pay

## 2011-06-10 MED ORDER — CLOPIDOGREL BISULFATE 75 MG PO TABS: 75.0000 mg | ORAL_TABLET | Freq: Every day | ORAL | Status: DC

## 2011-06-15 ENCOUNTER — Encounter: Payer: Self-pay | Admitting: Internal Medicine

## 2011-06-15 ENCOUNTER — Encounter: Payer: Self-pay | Admitting: *Deleted

## 2011-06-15 ENCOUNTER — Ambulatory Visit (INDEPENDENT_AMBULATORY_CARE_PROVIDER_SITE_OTHER): Payer: Medicare Other | Admitting: Internal Medicine

## 2011-06-15 DIAGNOSIS — Z95810 Presence of automatic (implantable) cardiac defibrillator: Secondary | ICD-10-CM

## 2011-06-15 DIAGNOSIS — I252 Old myocardial infarction: Secondary | ICD-10-CM

## 2011-06-15 DIAGNOSIS — I4891 Unspecified atrial fibrillation: Secondary | ICD-10-CM

## 2011-06-15 NOTE — Assessment & Plan Note (Signed)
She denies anginal symptoms. She does have coronary disease which has been well controlled with medical therapy.

## 2011-06-15 NOTE — Assessment & Plan Note (Signed)
Her device has reached elective replacement indication. We'll plan to schedule her for removal of her ICD and insertion of a pacemaker in the next several weeks.

## 2011-06-15 NOTE — Progress Notes (Signed)
HPI [name] returns today for followup. She is a very pleasant 78 year old woman with a history of chronic systolic heart failure, left bundle branch block, status post  Biventricular ICD implantation. Her device has reached elective replacement. She has class II heart failure symptoms. She denies chest pain, shortness of breath, or peripheral edema. No Known Allergies   Current Outpatient Prescriptions  Medication Sig Dispense Refill  . amiodarone (PACERONE) 200 MG tablet Take 100 mg by mouth daily.        Marland Kitchen aspirin 81 MG tablet Take 81 mg by mouth daily.       . carvedilol (COREG) 6.25 MG tablet Take 6.25 mg by mouth 2 (two) times daily with a meal.        . clopidogrel (PLAVIX) 75 MG tablet Take 1 tablet (75 mg total) by mouth daily.  90 tablet  3  . latanoprost (XALATAN) 0.005 % ophthalmic solution Place 1 drop into the left eye at bedtime.        . levalbuterol (XOPENEX HFA) 45 MCG/ACT inhaler Inhale 2 puffs into the lungs every 6 (six) hours as needed for wheezing or shortness of breath.  1 Inhaler  0  . levothyroxine (SYNTHROID, LEVOTHROID) 50 MCG tablet Take 50 mcg by mouth daily.        Marland Kitchen lisinopril-hydrochlorothiazide (PRINZIDE,ZESTORETIC) 10-12.5 MG per tablet Take 1 tablet by mouth daily.  90 tablet  4  . Multiple Vitamins-Minerals (MULTIVITAMINS THER. W/MINERALS) TABS Take 1 tablet by mouth daily.        Bertram Gala Glycol-Propyl Glycol (SYSTANE OP) Apply 1 drop to eye at bedtime.        . rosuvastatin (CRESTOR) 10 MG tablet Take 1 tablet (10 mg total) by mouth at bedtime.  90 tablet  3  . tiotropium (SPIRIVA) 18 MCG inhalation capsule Place 1 capsule (18 mcg total) into inhaler and inhale daily.  30 capsule  0     Past Medical History  Diagnosis Date  . Atrial fibrillation   . Hyperlipidemia   . Myocardial infarction   . Hyperlipidemia   . Osteoarthritis   . ICD (implantable cardiac defibrillator) in place     St. Jude  . LBBB (left bundle branch block)   .  Ventricular tachycardia   . Cardiomyopathy   . Glaucoma   . History of uterine cancer   . History of total hysterectomy   . Coronary artery disease     hx of MI  . Hypertension   . Hypoxia 12/12    suspect copd but pt refuses any pulmonary work up or treatment    ROS:   All systems reviewed and negative except as noted in the HPI.   Past Surgical History  Procedure Date  . Coronary artery bypass graft 1995  . Cataract extraction   . Cholecystectomy   . Coronary angioplasty   . Hernia repair   . Coronary angioplasty with stent placement   . Cardiac defibrillator placement   . Cardiac defibrillator placement   . Abdominal hysterectomy 1989    total  . Doppler echocardiography 2008     Family History  Problem Relation Age of Onset  . Heart attack Father   . Heart disease Father     MI  . Diverticulitis Mother   . Ovarian cancer Sister   . Cancer Sister     ovarian CA     History   Social History  . Marital Status: Widowed    Spouse Name: N/A  Number of Children: 1  . Years of Education: N/A   Occupational History  .     Social History Main Topics  . Smoking status: Former Games developer  . Smokeless tobacco: Not on file  . Alcohol Use: No  . Drug Use: No  . Sexually Active: Not on file   Other Topics Concern  . Not on file   Social History Narrative  . No narrative on file     BP 116/70  Pulse 83  Ht 5\' 3"  (1.6 m)  Wt 100.699 kg (222 lb)  BMI 39.33 kg/m2  Physical Exam:  Well appearing elderly woman, NAD HEENT: Unremarkable Neck:  No JVD, no thyromegally Lungs:  Clear with no wheezes, rales, or rhonchi. Well-healed ICD incision. HEART:  Regular rate rhythm, no murmurs, no rubs, no clicks Abd:  soft, positive bowel sounds, no organomegally, no rebound, no guarding Ext:  2 plus pulses, no edema, no cyanosis, no clubbing Skin:  No rashes no nodules Neuro:  CN II through XII intact, motor grossly intact  DEVICE  Normal device function.  See  PaceArt for details.  ERI  Assess/Plan:

## 2011-06-15 NOTE — Patient Instructions (Signed)
Patient is going to have a Bi-V Pacemaker implanted ICD is a generator change

## 2011-06-15 NOTE — Assessment & Plan Note (Signed)
She remains out of rhythm approximately 20% of the time. She will continue her medical therapy for now. We would consider up titrating her amiodarone in the future if her A. Fib worsens

## 2011-06-20 ENCOUNTER — Other Ambulatory Visit: Payer: Self-pay | Admitting: Internal Medicine

## 2011-06-21 ENCOUNTER — Other Ambulatory Visit: Payer: Self-pay

## 2011-06-21 MED ORDER — LEVOTHYROXINE SODIUM 50 MCG PO TABS: 50.0000 ug | ORAL_TABLET | Freq: Every day | ORAL | Status: DC

## 2011-07-03 NOTE — Progress Notes (Signed)
Addended by: Dennis Bast F on: 07/03/2011 05:48 PM   Modules accepted: Orders

## 2011-07-09 ENCOUNTER — Encounter (HOSPITAL_COMMUNITY): Payer: Self-pay | Admitting: Pharmacy Technician

## 2011-07-14 ENCOUNTER — Other Ambulatory Visit (INDEPENDENT_AMBULATORY_CARE_PROVIDER_SITE_OTHER): Payer: Medicare Other | Admitting: *Deleted

## 2011-07-14 DIAGNOSIS — I252 Old myocardial infarction: Secondary | ICD-10-CM

## 2011-07-14 DIAGNOSIS — I4891 Unspecified atrial fibrillation: Secondary | ICD-10-CM

## 2011-07-14 DIAGNOSIS — Z95810 Presence of automatic (implantable) cardiac defibrillator: Secondary | ICD-10-CM

## 2011-07-14 LAB — CBC WITH DIFFERENTIAL/PLATELET
Basophils Relative: 0.3 % (ref 0.0–3.0)
Eosinophils Relative: 1.7 % (ref 0.0–5.0)
HCT: 42 % (ref 36.0–46.0)
MCV: 91.6 fl (ref 78.0–100.0)
Monocytes Absolute: 0.7 10*3/uL (ref 0.1–1.0)
Neutrophils Relative %: 64.3 % (ref 43.0–77.0)
RBC: 4.59 Mil/uL (ref 3.87–5.11)
WBC: 6.7 10*3/uL (ref 4.5–10.5)

## 2011-07-14 LAB — BASIC METABOLIC PANEL
Chloride: 102 mEq/L (ref 96–112)
Potassium: 4 mEq/L (ref 3.5–5.1)

## 2011-07-20 ENCOUNTER — Other Ambulatory Visit: Payer: Self-pay | Admitting: *Deleted

## 2011-07-20 DIAGNOSIS — I428 Other cardiomyopathies: Secondary | ICD-10-CM

## 2011-07-22 MED ORDER — CHLORHEXIDINE GLUCONATE 4 % EX LIQD
60.0000 mL | Freq: Once | CUTANEOUS | Status: DC
  Filled 2011-07-22: qty 60

## 2011-07-22 MED ORDER — SODIUM CHLORIDE 0.9 % IR SOLN
80.0000 mg | Status: DC
  Filled 2011-07-22 (×3): qty 2

## 2011-07-22 MED ORDER — SODIUM CHLORIDE 0.9 % IV SOLN: INTRAVENOUS | Status: DC

## 2011-07-22 MED ORDER — CEFAZOLIN SODIUM-DEXTROSE 2-3 GM-% IV SOLR
2.0000 g | INTRAVENOUS | Status: DC
  Filled 2011-07-22 (×2): qty 50

## 2011-07-23 ENCOUNTER — Ambulatory Visit (HOSPITAL_COMMUNITY)
Admission: RE | Admit: 2011-07-23 | Discharge: 2011-07-23 | Disposition: A | Discharge status: Home / Self Care | Payer: Medicare Other | Source: Ambulatory Visit | Attending: Internal Medicine | Admitting: Internal Medicine

## 2011-07-23 ENCOUNTER — Other Ambulatory Visit: Payer: Self-pay

## 2011-07-23 ENCOUNTER — Encounter (HOSPITAL_COMMUNITY)
Admission: RE | Disposition: A | Discharge status: Home / Self Care | Payer: Self-pay | Source: Ambulatory Visit | Attending: Internal Medicine

## 2011-07-23 DIAGNOSIS — I509 Heart failure, unspecified: Secondary | ICD-10-CM | POA: Insufficient documentation

## 2011-07-23 DIAGNOSIS — I2589 Other forms of chronic ischemic heart disease: Secondary | ICD-10-CM | POA: Insufficient documentation

## 2011-07-23 DIAGNOSIS — Z45018 Encounter for adjustment and management of other part of cardiac pacemaker: Secondary | ICD-10-CM | POA: Insufficient documentation

## 2011-07-23 DIAGNOSIS — I442 Atrioventricular block, complete: Secondary | ICD-10-CM

## 2011-07-23 DIAGNOSIS — I5022 Chronic systolic (congestive) heart failure: Secondary | ICD-10-CM | POA: Insufficient documentation

## 2011-07-23 DIAGNOSIS — I428 Other cardiomyopathies: Secondary | ICD-10-CM

## 2011-07-23 DIAGNOSIS — I2510 Atherosclerotic heart disease of native coronary artery without angina pectoris: Secondary | ICD-10-CM | POA: Insufficient documentation

## 2011-07-23 DIAGNOSIS — E785 Hyperlipidemia, unspecified: Secondary | ICD-10-CM | POA: Insufficient documentation

## 2011-07-23 HISTORY — PX: BI-VENTRICULAR PACEMAKER INSERTION: SHX5462

## 2011-07-23 LAB — SURGICAL PCR SCREEN: Staphylococcus aureus: NEGATIVE

## 2011-07-23 LAB — PROTIME-INR: INR: 1.02 (ref 0.00–1.49)

## 2011-07-23 SURGERY — BI-VENTRICULAR PACEMAKER INSERTION (CRT-P)
Anesthesia: LOCAL

## 2011-07-23 MED ORDER — ACETAMINOPHEN 325 MG PO TABS: 325.0000 mg | ORAL_TABLET | ORAL | Status: DC | PRN

## 2011-07-23 MED ORDER — MUPIROCIN 2 % EX OINT
TOPICAL_OINTMENT | Freq: Once | CUTANEOUS | Status: AC
  Administered 2011-07-23: 08:00:00 via NASAL
  Filled 2011-07-23: qty 22

## 2011-07-23 MED ORDER — CLOPIDOGREL BISULFATE 75 MG PO TABS: 75.0000 mg | ORAL_TABLET | Freq: Every day | ORAL | Status: DC

## 2011-07-23 MED ORDER — LIDOCAINE HCL (PF) 1 % IJ SOLN
INTRAMUSCULAR | Status: AC
  Filled 2011-07-23: qty 60

## 2011-07-23 MED ORDER — FENTANYL CITRATE 0.05 MG/ML IJ SOLN
INTRAMUSCULAR | Status: AC
  Filled 2011-07-23: qty 2

## 2011-07-23 MED ORDER — LATANOPROST 0.005 % OP SOLN: 1.0000 [drp] | Freq: Every day | OPHTHALMIC | Status: DC

## 2011-07-23 MED ORDER — ASPIRIN EC 81 MG PO TBEC: 81.0000 mg | DELAYED_RELEASE_TABLET | Freq: Every day | ORAL | Status: DC

## 2011-07-23 MED ORDER — LISINOPRIL-HYDROCHLOROTHIAZIDE 10-12.5 MG PO TABS: 1.0000 | ORAL_TABLET | Freq: Every day | ORAL | Status: DC

## 2011-07-23 MED ORDER — AMIODARONE HCL 100 MG PO TABS: 100.0000 mg | ORAL_TABLET | Freq: Every day | ORAL | Status: DC

## 2011-07-23 MED ORDER — ONDANSETRON HCL 4 MG/2ML IJ SOLN: 4.0000 mg | Freq: Four times a day (QID) | INTRAMUSCULAR | Status: DC | PRN

## 2011-07-23 MED ORDER — MIDAZOLAM HCL 5 MG/5ML IJ SOLN
INTRAMUSCULAR | Status: AC
  Filled 2011-07-23: qty 5

## 2011-07-23 MED ORDER — LEVOTHYROXINE SODIUM 50 MCG PO TABS: 50.0000 ug | ORAL_TABLET | Freq: Every day | ORAL | Status: DC

## 2011-07-23 MED ORDER — CARVEDILOL 6.25 MG PO TABS: 6.2500 mg | ORAL_TABLET | Freq: Two times a day (BID) | ORAL | Status: DC

## 2011-07-23 NOTE — Interval H&P Note (Signed)
History and Physical Interval Note:  07/23/2011 8:45 AM  [name]  has presented today for surgery, with the diagnosis of eri/afib  The various methods of treatment have been discussed with the patient and family. After consideration of risks, benefits and other options for treatment, the patient has consented to  Procedure(s) (LRB): BI-VENTRICULAR PACEMAKER INSERTION (CRT-P) (N/A) as a surgical intervention .  The patients' history has been reviewed, patient examined, no change in status, stable for surgery.  I have reviewed the patients' chart and labs.  Questions were answered to the patient's satisfaction.     Lewayne Bunting

## 2011-07-23 NOTE — Op Note (Signed)
BiV ICD generator removal and insertion of a new BiV PPM without immediate complication. [id number].

## 2011-07-23 NOTE — Discharge Instructions (Signed)
Pacemaker Battery Change A pacemaker battery usually lasts 4 to 12 years. Once or twice per year, you will be asked to visit your caregiver to have a full evaluation of your pacemaker. When a battery needs to be replaced, the entire pacemaker is actually replaced so that you can benefit from new circuitry and any new features that have recently been added to pacemakers. Most often, this procedure is very simple because the leads are already in place. After giving medicine to numb the skin, your health care provider makes a cut to reopen the pocket holding the pacemaker and disconnects the old device from its leads. The leads are routinely tested at this time. If they are working okay, the new pacemaker may simply be connected to the existing leads. If there is any problem with the old lead system, it may be wise to replace the lead system while inserting the new pacemaker. There are many things that affect how long a pacemaker battery will last:  Age of the pacemaker.   Number of leads (1, 2 or 3).   Pacemaker work load. If the pacemaker is helping the heart more often, then the battery will not last as long as if the pacemaker does not need to help the heart.   Resistance of the leads. The greater the resistance, the greater the drain on the battery. This can increase as the leads get older or if one or more of the leads does not have the best contact with the heart.   Power (voltage) settings.   The health of the person's heart. If the health of the heart gets worse, then the pacemaker may have to work more often and the setting changed to accommodate these changes.  Your health care provider will be alerted to the fact that it is time to replace the battery during follow-up exams. He or she will check your pacemaker using a small table-top computer, called a programmer, and a wand. The wand is about the same size as a remote control. Your provider puts the wand on your body in the area where the  pacemaker is located. Information from the pacemaker is received about how well your heart is working and the status of the battery. It is not painful, and it usually takes just a few minutes. You will have plenty of time before the battery is fully used up to plan for replacement.  LET YOUR CAREGIVER KNOW ABOUT:   Symptoms of chest pain, trouble breathing, palpitations, lightheadedness, or feelings of an abnormal or irregular heart beat.   Allergies.   Medications taken including herbs, eye drops, over the counter medications, and creams   Use of steroids (by mouth or creams).   Possible pregnancy, if applicable.   Previous problems with anesthetics or Novocaine.   History of blood clots (thrombophlebitis).   History of bleeding or blood problems.   Surgery since your last pacemaker placement.   Other health problems.  RISKS AND COMPLICATIONS These are very uncommon but include:  Bleeding.   Bruising of the skin around where the incision was made.   Pain at the site of the incision.   Pulling apart of the skin at the incision site.   Infection.   Allergic reaction to anesthetics or medicines used during the procedure.  Diabetics may have a temporary increase in their blood sugar after any surgical procedure.  BEFORE THE PROCEDURE  Wash all of the skin around the area of the chest where the pacemaker is located.   Try to remove any loose, scaling skin. Unless advised otherwise, avoid using aspirin, ibuprofen, or naproxen for 3-4 days before the procedure. Ask your caregiver for help with any other medication adjustments before the pacemaker is replaced. Unless advised otherwise, do not eat or drink after midnight on the night before the procedure EXCEPT for drinking water and taking your medications as you normally would. AFTER THE PROCEDURE   A heart monitor and the pacemaker programmer will be used to make sure that the new pacemaker is working properly.   You can go home  after the procedure.   Your caregiver will advise you if you need to have any stitches. They will be removed 5-7 days after the procedure.  HOME CARE INSTRUCTIONS   Keep the incision clean and dry.   Unless advised otherwise, you may shower after carefully covering the incision with plastic wrap that is taped to your chest.   For the first week after the replacement, avoid stretching motions that pull at the incision site and avoid heavy exercise with the arm on the same side as the incision.   Only take over-the-counter or prescription medicines for pain, discomfort, or fever as directed by your caregiver.   Your caregiver will tell you when you will need to next test your pacemaker by telephone or when to return to the office for re-exam and/or removal of stitches, if necessary.  SEEK MEDICAL CARE IF:   You have unusual pain at the incision site that is not adequately helped by over-the-counter or prescription medicine.   There is drainage or pus from the incision site.   You develop red streaking that extends above or below the incision site.   You feel brief intermittent palpitations, lightheadedness or any symptoms that you feel might be related to your heart.  SEEK IMMEDIATE MEDICAL CARE IF:   You experience chest pain that is different than the pain at the incision site.   You experience:   Shortness of breath.   Palpitations.   Irregular heart beat.   Lightheadedness that does not go away quickly.   Fainting.   You develop a fever.   You have pain that gets worse even though you are taking pain medicine.  MAKE SURE YOU:   Understand these instructions.   Will watch your condition.   Will get help right away if you are not doing well or get worse.  Document Released: 08/25/2006 Document Revised: 01/27/2011 Document Reviewed: 11/28/2006 [employer] Patient Information 2012 [employer]. 

## 2011-07-23 NOTE — H&P (Signed)
EP Admit Note  HPI  [name] returns today for followup. She is a very pleasant 78 year old woman with a history of chronic systolic heart failure, left bundle branch block, status post Biventricular ICD implantation. Her device has reached elective replacement. She has class II heart failure symptoms. She denies chest pain, shortness of breath, or peripheral edema. She has reached ERI with her device and is scheduled for generator change going from a BIV ICD to a BiV PPM. No Known Allergies  Current Outpatient Prescriptions   Medication  Sig  Dispense  Refill   .  amiodarone (PACERONE) 200 MG tablet  Take 100 mg by mouth daily.     Marland Kitchen  aspirin 81 MG tablet  Take 81 mg by mouth daily.     .  carvedilol (COREG) 6.25 MG tablet  Take 6.25 mg by mouth 2 (two) times daily with a meal.     .  clopidogrel (PLAVIX) 75 MG tablet  Take 1 tablet (75 mg total) by mouth daily.  90 tablet  3   .  latanoprost (XALATAN) 0.005 % ophthalmic solution  Place 1 drop into the left eye at bedtime.     .  levalbuterol (XOPENEX HFA) 45 MCG/ACT inhaler  Inhale 2 puffs into the lungs every 6 (six) hours as needed for wheezing or shortness of breath.  1 Inhaler  0   .  levothyroxine (SYNTHROID, LEVOTHROID) 50 MCG tablet  Take 50 mcg by mouth daily.     Marland Kitchen  lisinopril-hydrochlorothiazide (PRINZIDE,ZESTORETIC) 10-12.5 MG per tablet  Take 1 tablet by mouth daily.  90 tablet  4   .  Multiple Vitamins-Minerals (MULTIVITAMINS THER. W/MINERALS) TABS  Take 1 tablet by mouth daily.     Bertram Gala Glycol-Propyl Glycol (SYSTANE OP)  Apply 1 drop to eye at bedtime.     .  rosuvastatin (CRESTOR) 10 MG tablet  Take 1 tablet (10 mg total) by mouth at bedtime.  90 tablet  3   .  tiotropium (SPIRIVA) 18 MCG inhalation capsule  Place 1 capsule (18 mcg total) into inhaler and inhale daily.  30 capsule  0    Past Medical History   Diagnosis  Date   .  Atrial fibrillation    .  Hyperlipidemia    .  Myocardial infarction    .   Hyperlipidemia    .  Osteoarthritis    .  ICD (implantable cardiac defibrillator) in place      St. Jude   .  LBBB (left bundle branch block)    .  Ventricular tachycardia    .  Cardiomyopathy    .  Glaucoma    .  History of uterine cancer    .  History of total hysterectomy    .  Coronary artery disease      hx of MI   .  Hypertension    .  Hypoxia  12/12     suspect copd but pt refuses any pulmonary work up or treatment    ROS:  All systems reviewed and negative except as noted in the HPI.  Past Surgical History   Procedure  Date   .  Coronary artery bypass graft  1995   .  Cataract extraction    .  Cholecystectomy    .  Coronary angioplasty    .  Hernia repair    .  Coronary angioplasty with stent placement    .  Cardiac defibrillator placement    .  Cardiac defibrillator placement    .  Abdominal hysterectomy  1989     total   .  Doppler echocardiography  2008    Family History   Problem  Relation  Age of Onset   .  Heart attack  Father    .  Heart disease  Father       MI    .  Diverticulitis  Mother    .  Ovarian cancer  Sister    .  Cancer  Sister       ovarian CA    History    Social History   .  Marital Status:  Widowed     Spouse Name:  N/A     Number of Children:  1   .  Years of Education:  N/A    Occupational History   .      Social History Main Topics   .  Smoking status:  Former Games developer   .  Smokeless tobacco:  Not on file   .  Alcohol Use:  No   .  Drug Use:  No   .  Sexually Active:  Not on file    Other Topics  Concern   .  Not on file    Social History Narrative   .  No narrative on file    BP 116/70  Pulse 83  Ht 5\' 3"  (1.6 m)  Wt 100.699 kg (222 lb)  BMI 39.33 kg/m2  Physical Exam:  Well appearing elderly woman, NAD  HEENT: Unremarkable  Neck: No JVD, no thyromegally  Lungs: Clear with no wheezes, rales, or rhonchi. Well-healed ICD incision.  HEART: Regular rate rhythm, no murmurs, no rubs, no clicks  Abd: soft,  positive bowel sounds, no organomegally, no rebound, no guarding  Ext: 2 plus pulses, no edema, no cyanosis, no clubbing  Skin: No rashes no nodules  Neuro: CN II through XII intact, motor grossly intact  DEVICE  Normal device function. See PaceArt for details. ERI    Assess/Plan:  ICD - IN SITU - Lewayne Bunting, MD 06/15/2011 2:19 PM Signed  Her device has reached elective replacement indication. We'll plan to schedule her for removal of her ICD and insertion of a pacemaker in the next several weeks. MYOCARDIAL INFARCTION, HX OF - Lewayne Bunting, MD 06/15/2011 2:20 PM Signed  She denies anginal symptoms. She does have coronary disease which has been well controlled with medical therapy. Atrial fibrillation - Lewayne Bunting, MD 06/15/2011 2:20 PM Signed  She remains out of rhythm approximately 20% of the time. She will continue her medical therapy for now. We would consider up titrating her amiodarone in the future if her A. Fib worsens

## 2011-07-23 NOTE — Op Note (Signed)
NAMEANNALY, SKOP              ACCOUNT NO.:  [id number]  MEDICAL RECORD NO.:  [id number]  LOCATION:  [facility]                         FACILITY:  [facility]  PHYSICIAN:  Doylene Canning. Ladona Ridgel, MD    DATE OF BIRTH:  [birth year]  DATE OF PROCEDURE:  07/23/2011 DATE OF DISCHARGE:                              OPERATIVE REPORT   PROCEDURE PERFORMED:  Removal of previously implanted Bi-V ICD followed by insertion of a Bi-V pacemaker.  INDICATION:  Ischemic cardiomyopathy, chronic systolic heart failure, complete heart block, severe left ventricular dysfunction, EF 25% class III heart failure.  INTRODUCTION:  The patient is an 78 year old woman who underwent Bi-V ICD insertion approximately 5 years ago.  She presented with congestive heart failure and underwent Bi-V ICD insertion.  Her device has now reached elective replacement.  She is now referred for removal of her old device and because of her advanced age,  insertion of a biventricular pacemaker.  PROCEDURE:  After informed consent was obtained, the patient was taken to the diagnostic EP lab in a fasting state.  After usual preparation and draping, intravenous fentanyl and midazolam was given for sedation. 30 mL of lidocaine was infiltrated into the left infraclavicular region. The 7 cm incision was carried out over this region. Electrocautery was used and dissected down to the ICD pocket.  The generator was removed without difficulty.  Inspection of the leads demonstrated that the leads have been twisted upon themselves quite extensively.  The LV lead was disconnected and hooked up and paced bipolar.  The old other leads were removed and the twisted up 12 leads were straightened out.  The pocket was irrigated with antibiotic irrigation.  The St. Jude anthem RF dual- chamber biventricular pacemaker serial [id number], was connected to the atrial RV rate sense lead and LV leads and placed back in the subcutaneous pocket.  The SVC and RV coils  were capped.  The pocket was irrigated with antibiotic irrigation and the incision closed with 2-0 and 3-0 Vicryl.  Benzoin, Steri-Strips were painted on the skin, pressure dressing was applied, the patient was returned to her room in satisfactory condition.  COMPLICATIONS:  There were no immediate procedure complications.  RESULTS:  This demonstrates successful removal of previous implanted St. Jude Bi-V ICD, which had reached elective replacement indication followed by insertion of a new St. Jude Bi-V pacemaker.     Doylene Canning. Ladona Ridgel, MD     GWT/MEDQ  D:  07/23/2011  T:  07/23/2011  Job:  [id number]

## 2011-07-26 ENCOUNTER — Encounter (HOSPITAL_COMMUNITY): Payer: Self-pay

## 2011-07-26 ENCOUNTER — Telehealth: Payer: Self-pay | Admitting: Internal Medicine

## 2011-07-26 NOTE — Telephone Encounter (Signed)
Spoke with daughter.  Okay to drive and no real restrictions with left arm.  After her wound check she cn shower and then will be scheduled for 3 months and at that time we will go over remote transmissions

## 2011-07-26 NOTE — Telephone Encounter (Signed)
New Msg: Pt daughter calling wanting to speak with nurse regarding pt use of L arm; if pt is able to drive,etc.   Please return pt daughter call to discuss further.

## 2011-08-02 ENCOUNTER — Telehealth: Payer: Self-pay | Admitting: *Deleted

## 2011-08-02 NOTE — Telephone Encounter (Signed)
Certificate of Medical Necessity is needed for oxygen use.  Form in your IN box.

## 2011-08-02 NOTE — Telephone Encounter (Signed)
I sent form back up -- with note that pt is not on 02  They may need to send another?

## 2011-08-02 NOTE — Telephone Encounter (Signed)
She declines oxygen and is not using it -- I already indicated this on one form - tell them to cancel order  Thanks

## 2011-08-02 NOTE — Telephone Encounter (Signed)
Spoke to [name] at [facility] and was informed that this form is for the oxygen that the patient used 04/26/11 to 05/04/11 and they need this signed to bill Medicare for those dates.

## 2011-08-03 NOTE — Telephone Encounter (Signed)
New copy of CMN in your IN box.

## 2011-08-03 NOTE — Telephone Encounter (Signed)
I had one this am too- did it this morning, thanks

## 2011-08-03 NOTE — Telephone Encounter (Signed)
Spoke with Dois Davenport and she will sent fax another copy of the CMN.

## 2011-08-04 ENCOUNTER — Ambulatory Visit (INDEPENDENT_AMBULATORY_CARE_PROVIDER_SITE_OTHER): Payer: Medicare Other | Admitting: *Deleted

## 2011-08-04 ENCOUNTER — Encounter: Payer: Self-pay | Admitting: Internal Medicine

## 2011-08-04 DIAGNOSIS — I2589 Other forms of chronic ischemic heart disease: Secondary | ICD-10-CM

## 2011-08-04 DIAGNOSIS — I447 Left bundle-branch block, unspecified: Secondary | ICD-10-CM

## 2011-08-04 DIAGNOSIS — I4901 Ventricular fibrillation: Secondary | ICD-10-CM

## 2011-08-04 LAB — PACEMAKER DEVICE OBSERVATION
AL AMPLITUDE: 1 mv
BAMS-0001: 150 {beats}/min
DEVICE MODEL PM: 2761365
LV LEAD THRESHOLD: 2.25 V
RV LEAD THRESHOLD: 1 V
VENTRICULAR PACING PM: 98

## 2011-08-04 NOTE — Progress Notes (Signed)
Wound check-PPM 

## 2011-10-16 ENCOUNTER — Other Ambulatory Visit: Payer: Self-pay | Admitting: Internal Medicine

## 2011-10-20 ENCOUNTER — Ambulatory Visit (INDEPENDENT_AMBULATORY_CARE_PROVIDER_SITE_OTHER): Payer: Medicare Other | Admitting: Internal Medicine

## 2011-10-20 ENCOUNTER — Encounter: Payer: Self-pay | Admitting: Internal Medicine

## 2011-10-20 VITALS — BP 148/82 | HR 68 | Ht 63.0 in | Wt 226.8 lb

## 2011-10-20 DIAGNOSIS — I2589 Other forms of chronic ischemic heart disease: Secondary | ICD-10-CM

## 2011-10-20 DIAGNOSIS — I4891 Unspecified atrial fibrillation: Secondary | ICD-10-CM

## 2011-10-20 DIAGNOSIS — I5022 Chronic systolic (congestive) heart failure: Secondary | ICD-10-CM | POA: Insufficient documentation

## 2011-10-20 LAB — PACEMAKER DEVICE OBSERVATION
AL IMPEDENCE PM: 300 Ohm
AL THRESHOLD: 0.5 V
ATRIAL PACING PM: 99
BAMS-0001: 150 {beats}/min
BAMS-0003: 70 {beats}/min
BATTERY VOLTAGE: 2.9 V
DEVICE MODEL PM: 2761365
RV LEAD IMPEDENCE PM: 460 Ohm
VENTRICULAR PACING PM: 99

## 2011-10-20 MED ORDER — LISINOPRIL 10 MG PO TABS: 10.0000 mg | ORAL_TABLET | Freq: Every day | ORAL | Status: DC

## 2011-10-20 MED ORDER — FUROSEMIDE 40 MG PO TABS: 40.0000 mg | ORAL_TABLET | Freq: Every day | ORAL | Status: DC

## 2011-10-20 NOTE — Progress Notes (Signed)
HPI [name] returns today for followup. She is a very pleasant 78 year old woman with an ischemic cardiomyopathy, chronic systolic heart failure, heart block, status post biventricular ICD implantation. Several months ago, she reached elective replacement and had her defibrillator removed and had a pacemaker placed. Since then she has been stable except for 1 week of increasing peripheral edema and shortness of breath. The patient admits to dietary indiscretion with sodium came back for the last 3 weeks. She has not missed to medications. No Known Allergies   Current Outpatient Prescriptions  Medication Sig Dispense Refill  . amiodarone (PACERONE) 200 MG tablet TAKE ONE-HALF (1/2) TABLET DAILY  45 tablet  2  . aspirin EC 81 MG tablet Take 81 mg by mouth daily.      . carvedilol (COREG) 6.25 MG tablet Take 6.25 mg by mouth 2 (two) times daily with a meal.       . clopidogrel (PLAVIX) 75 MG tablet Take 75 mg by mouth daily.      Marland Kitchen latanoprost (XALATAN) 0.005 % ophthalmic solution Place 1 drop into the left eye at bedtime.       Marland Kitchen levothyroxine (SYNTHROID, LEVOTHROID) 50 MCG tablet Take 50 mcg by mouth daily.      Bertram Gala Glycol-Propyl Glycol (SYSTANE OP) Apply 1 drop to eye at bedtime.       . rosuvastatin (CRESTOR) 10 MG tablet Take 10 mg by mouth at bedtime.      . furosemide (LASIX) 40 MG tablet Take 1 tablet (40 mg total) by mouth daily.  90 tablet  3  . lisinopril (PRINIVIL,ZESTRIL) 10 MG tablet Take 1 tablet (10 mg total) by mouth daily.  90 tablet  3     Past Medical History  Diagnosis Date  . Atrial fibrillation   . Hyperlipidemia   . Myocardial infarction   . Hyperlipidemia   . Osteoarthritis   . ICD (implantable cardiac defibrillator) in place     St. Jude  . LBBB (left bundle branch block)   . Ventricular tachycardia   . Cardiomyopathy   . Glaucoma   . History of uterine cancer   . History of total hysterectomy   . Coronary artery disease     hx of MI  .  Hypertension   . Hypoxia 12/12    suspect copd but pt refuses any pulmonary work up or treatment    ROS:   All systems reviewed and negative except as noted in the HPI.   Past Surgical History  Procedure Date  . Coronary artery bypass graft 1995  . Cataract extraction   . Cholecystectomy   . Coronary angioplasty   . Hernia repair   . Coronary angioplasty with stent placement   . Cardiac defibrillator placement   . Cardiac defibrillator placement   . Abdominal hysterectomy 1989    total  . Doppler echocardiography 2008     Family History  Problem Relation Age of Onset  . Heart attack Father   . Heart disease Father     MI  . Diverticulitis Mother   . Ovarian cancer Sister   . Cancer Sister     ovarian CA     History   Social History  . Marital Status: Widowed    Spouse Name: N/A    Number of Children: 1  . Years of Education: N/A   Occupational History  .     Social History Main Topics  . Smoking status: Former Smoker    Quit date:  10/20/1982  . Smokeless tobacco: Not on file  . Alcohol Use: No  . Drug Use: No  . Sexually Active: Not on file   Other Topics Concern  . Not on file   Social History Narrative  . No narrative on file     BP 148/82  Pulse 68  Ht 5\' 3"  (1.6 m)  Wt 226 lb 12.8 oz (102.876 kg)  BMI 40.18 kg/m2  Physical Exam:  Well appearing elderly woman, NAD HEENT: Unremarkable Neck:  7 cm JVD, no thyromegally Lungs:  Clear except for rales in the bases bilaterally. No wheezes or rhonchi. No increased work of breathing. HEART:  Regular rate rhythm, no murmurs, no rubs, no clicks Abd:  soft, positive bowel sounds, no organomegally, no rebound, no guarding Ext:  2 plus pulses, 1+ edema bilaterally, no cyanosis, no clubbing Skin:  No rashes no nodules Neuro:  CN II through XII intact, motor grossly intact   DEVICE  Normal device function.  See PaceArt for details.   Assess/Plan:

## 2011-10-20 NOTE — Assessment & Plan Note (Signed)
She denies anginal symptoms. She will continue her current medical therapy. 

## 2011-10-20 NOTE — Assessment & Plan Note (Signed)
Her symptoms are class II and she has evidence of volume overload. I discussed the importance of refraining from eating salty foods. While she does not add salt she does admit to eating foods with increased salt like bacon. I've asked the patient stopped eating these types of foods. We will have her start some Lasix today to help improve her peripheral edema and her dyspnea.

## 2011-10-20 NOTE — Patient Instructions (Addendum)
Your physician wants you to follow-up in: 3 months with Dr Ladona Ridgel  Your physician has recommended you make the following change in your medication:  1) Stop Lisinopril/HCTZ 2) Start Lisinopril 10mg  daily 3) Start Furosemide 40mg --take one by mouth for 3 days then decrease to 1/2 pill dau=ily

## 2011-11-25 ENCOUNTER — Other Ambulatory Visit: Payer: Self-pay | Admitting: Internal Medicine

## 2012-01-17 ENCOUNTER — Other Ambulatory Visit: Payer: Self-pay | Admitting: Internal Medicine

## 2012-01-17 DIAGNOSIS — I5022 Chronic systolic (congestive) heart failure: Secondary | ICD-10-CM

## 2012-01-17 MED ORDER — LISINOPRIL 10 MG PO TABS: 10.0000 mg | ORAL_TABLET | Freq: Every day | ORAL | Status: DC

## 2012-01-17 NOTE — Telephone Encounter (Signed)
Out of pills 

## 2012-01-20 ENCOUNTER — Ambulatory Visit (INDEPENDENT_AMBULATORY_CARE_PROVIDER_SITE_OTHER): Payer: Medicare Other | Admitting: Cardiology

## 2012-01-20 ENCOUNTER — Encounter: Payer: Self-pay | Admitting: Internal Medicine

## 2012-01-20 DIAGNOSIS — Z95810 Presence of automatic (implantable) cardiac defibrillator: Secondary | ICD-10-CM

## 2012-01-20 DIAGNOSIS — I255 Ischemic cardiomyopathy: Secondary | ICD-10-CM

## 2012-01-20 DIAGNOSIS — I5022 Chronic systolic (congestive) heart failure: Secondary | ICD-10-CM

## 2012-01-20 NOTE — Progress Notes (Signed)
Device check only. See PaceArt report. 

## 2012-01-21 ENCOUNTER — Encounter: Payer: Medicare Other | Admitting: Cardiology

## 2012-01-26 LAB — PACEMAKER DEVICE OBSERVATION
AL AMPLITUDE: 1.3 mv
AL THRESHOLD: 0.5 V
BAMS-0003: 70 {beats}/min
DEVICE MODEL PM: 2761365
LV LEAD IMPEDENCE PM: 650 Ohm
RV LEAD IMPEDENCE PM: 450 Ohm
RV LEAD THRESHOLD: 1 V

## 2012-01-28 ENCOUNTER — Encounter: Payer: Self-pay | Admitting: Family Medicine

## 2012-01-28 ENCOUNTER — Ambulatory Visit (INDEPENDENT_AMBULATORY_CARE_PROVIDER_SITE_OTHER): Payer: Medicare Other | Admitting: Family Medicine

## 2012-01-28 VITALS — BP 128/64 | HR 73 | Temp 98.3°F | Ht 63.0 in

## 2012-01-28 DIAGNOSIS — M545 Low back pain: Secondary | ICD-10-CM

## 2012-01-28 DIAGNOSIS — J449 Chronic obstructive pulmonary disease, unspecified: Secondary | ICD-10-CM

## 2012-01-28 DIAGNOSIS — G8929 Other chronic pain: Secondary | ICD-10-CM

## 2012-01-28 MED ORDER — TIOTROPIUM BROMIDE MONOHYDRATE 18 MCG IN CAPS: 18.0000 ug | ORAL_CAPSULE | Freq: Every day | RESPIRATORY_TRACT | Status: DC

## 2012-01-28 MED ORDER — TRAMADOL HCL 50 MG PO TABS: 50.0000 mg | ORAL_TABLET | Freq: Three times a day (TID) | ORAL | Status: AC | PRN

## 2012-01-28 NOTE — Patient Instructions (Addendum)
Try the tramadol to use once in a while -- it can make you off balance so use caution and use your walker when you take it  Also this can be habit forming Use spiriva when you need it  Update me if this is not helpful Try to stay as active as you can

## 2012-01-28 NOTE — Assessment & Plan Note (Signed)
Pt given px for spiriva- works well and she uses prn She declines any pulm w/u at this time  Nl exam today

## 2012-01-28 NOTE — Progress Notes (Signed)
Subjective:    Patient ID: [name], female    DOB: August 24, 1921, 78 y.o.   MRN: [id number]  HPI Here to discuss medicines   She is interested in spiriva to use as needed  Only needs it when she is out and about and very muggy   Has hx of chronic back pain -without radiation  Is on either side of sacrum  Has a lot of arthritis and spurs in her lumbar spine  Last xray was from Dr Ophelia Charter  Has also seen Dr Alger Simons celebrex in the past also - cannot take that any longer  Needs some pain control  Had questions about tramadol  Would only need it when she is out and moving a lot   Dr Ophelia Charter wrote the px for her walker - he wants her to walk with it   Looking at old CT myelogram -- has multilevel disc dz L1->L4 ansl scoliosis  Spinal stenosis at L2-L3   She has had a fall and an arm fx in the past  She uses a walker now much of the time  Patient Active Problem List  Diagnosis  . HYPERLIPIDEMIA  . GLAUCOMA  . MYOCARDIAL INFARCTION, HX OF  . CARDIOMYOPATHY, ISCHEMIC  . LBBB  . Atrial fibrillation  . Ventricular fibrillation  . AAA  . OSTEOARTHRITIS  . UTERINE CANCER, HX OF  . ICD - IN SITU  . Hypoxia  . Bronchitis  . Chronic systolic heart failure   Past Medical History  Diagnosis Date  . Atrial fibrillation   . Hyperlipidemia   . Myocardial infarction   . Hyperlipidemia   . Osteoarthritis   . ICD (implantable cardiac defibrillator) in place     St. Jude  . LBBB (left bundle branch block)   . Ventricular tachycardia   . Cardiomyopathy   . Glaucoma   . History of uterine cancer   . History of total hysterectomy   . Coronary artery disease     hx of MI  . Hypertension   . Hypoxia 12/12    suspect copd but pt refuses any pulmonary work up or treatment   Past Surgical History  Procedure Date  . Coronary artery bypass graft 1995  . Cataract extraction   . Cholecystectomy   . Coronary angioplasty   . Hernia repair   . Coronary angioplasty with  stent placement   . Cardiac defibrillator placement   . Cardiac defibrillator placement   . Abdominal hysterectomy 1989    total  . Doppler echocardiography 2008   History  Substance Use Topics  . Smoking status: Former Smoker    Quit date: 10/20/1982  . Smokeless tobacco: Not on file  . Alcohol Use: No   Family History  Problem Relation Age of Onset  . Heart attack Father   . Heart disease Father     MI  . Diverticulitis Mother   . Ovarian cancer Sister   . Cancer Sister     ovarian CA   No Known Allergies Current Outpatient Prescriptions on File Prior to Visit  Medication Sig Dispense Refill  . amiodarone (PACERONE) 200 MG tablet TAKE ONE-HALF (1/2) TABLET DAILY  45 tablet  2  . aspirin EC 81 MG tablet Take 81 mg by mouth daily.      . carvedilol (COREG) 6.25 MG tablet Take 6.25 mg by mouth 2 (two) times daily with a meal.       . clopidogrel (PLAVIX) 75 MG tablet Take  75 mg by mouth daily.      . furosemide (LASIX) 40 MG tablet Take 1 tablet (40 mg total) by mouth daily.  90 tablet  3  . latanoprost (XALATAN) 0.005 % ophthalmic solution Place 1 drop into the left eye at bedtime.       Marland Kitchen levothyroxine (SYNTHROID, LEVOTHROID) 50 MCG tablet Take 50 mcg by mouth daily.      Marland Kitchen lisinopril (PRINIVIL,ZESTRIL) 10 MG tablet Take 1 tablet (10 mg total) by mouth daily.  90 tablet  3  . Polyethyl Glycol-Propyl Glycol (SYSTANE OP) Apply 1 drop to eye at bedtime.       . rosuvastatin (CRESTOR) 10 MG tablet Take 10 mg by mouth at bedtime.          Review of Systems Review of Systems  Constitutional: Negative for fever, appetite change, fatigue and unexpected weight change.  Eyes: Negative for pain and visual disturbance.  Respiratory: Negative for cough and pos for sob with wheeze when hot or muggy weather   Cardiovascular: Negative for cp or palpitations    Gastrointestinal: Negative for nausea, diarrhea and constipation.  Genitourinary: Negative for urgency and frequency.  Skin:  Negative for pallor or rash   MSK pos for chronic back pain , neg for joint swelling  Neurological: Negative for weakness, light-headedness, numbness and headaches.  Hematological: Negative for adenopathy. Does not bruise/bleed easily.  Psychiatric/Behavioral: Negative for dysphoric mood. The patient is not nervous/anxious.         Objective:   Physical Exam  Constitutional: She appears well-developed and well-nourished. No distress.  HENT:  Head: Normocephalic and atraumatic.  Nose: Nose normal.  Mouth/Throat: Oropharynx is clear and moist.  Eyes: Conjunctivae and EOM are normal. Pupils are equal, round, and reactive to light.  Neck: Normal range of motion. Neck supple. No JVD present. Carotid bruit is not present. Erythema present. No thyromegaly present.  Cardiovascular: Normal rate and regular rhythm.   Pulmonary/Chest: Effort normal and breath sounds normal. No respiratory distress. She has no wheezes. She has no rales. She exhibits no tenderness.       Diffusely distant bs   Musculoskeletal: She exhibits tenderness. She exhibits no edema.       Lumbar back: She exhibits decreased range of motion, tenderness, bony tenderness and pain. She exhibits no swelling, no deformity and normal pulse.       Poor rom LS with lower bony and muscular tenderness Neg SLR Gait is slow and labored today  Lymphadenopathy:    She has no cervical adenopathy.  Neurological: She is alert. She has normal strength and normal reflexes. She displays no atrophy. No cranial nerve deficit or sensory deficit. She exhibits normal muscle tone. Coordination normal.  Skin: Skin is warm and dry. No rash noted. No erythema. No pallor.  Psychiatric: She has a normal mood and affect.          Assessment & Plan:

## 2012-01-28 NOTE — Assessment & Plan Note (Signed)
Disc situation with significant disc dz and joint dz/ spurs No neurol def Has seen ortho  Uses walker  Needs pain control -and nsaid is not an option  Will try tramadol with great caution- using walker - pt will not use often  Will update

## 2012-01-31 ENCOUNTER — Other Ambulatory Visit: Payer: Self-pay | Admitting: Internal Medicine

## 2012-02-11 ENCOUNTER — Encounter: Payer: Self-pay | Admitting: Internal Medicine

## 2012-02-17 ENCOUNTER — Ambulatory Visit (INDEPENDENT_AMBULATORY_CARE_PROVIDER_SITE_OTHER): Payer: Medicare Other | Admitting: Cardiology

## 2012-02-17 ENCOUNTER — Encounter: Payer: Self-pay | Admitting: Internal Medicine

## 2012-02-17 ENCOUNTER — Encounter: Payer: Self-pay | Admitting: Cardiology

## 2012-02-17 VITALS — BP 154/82 | HR 74 | Ht 63.0 in

## 2012-02-17 DIAGNOSIS — I5022 Chronic systolic (congestive) heart failure: Secondary | ICD-10-CM

## 2012-02-17 DIAGNOSIS — I2589 Other forms of chronic ischemic heart disease: Secondary | ICD-10-CM

## 2012-02-17 DIAGNOSIS — I255 Ischemic cardiomyopathy: Secondary | ICD-10-CM

## 2012-02-17 DIAGNOSIS — Z95810 Presence of automatic (implantable) cardiac defibrillator: Secondary | ICD-10-CM

## 2012-02-18 LAB — PACEMAKER DEVICE OBSERVATION
AL AMPLITUDE: 1.2 mv
BAMS-0003: 70 {beats}/min
LV LEAD THRESHOLD: 2.5 V
RV LEAD IMPEDENCE PM: 490 Ohm
RV LEAD THRESHOLD: 1 V

## 2012-02-18 NOTE — Progress Notes (Signed)
Device check only with St. Jude representative. Discussed with Dr. Johney Frame. Adjustments made to determine best pacing configuration to promote battery longevity. LV outputs decreased. There is some concern about anodal stimulation. Please see PaceArt report.

## 2012-03-01 ENCOUNTER — Ambulatory Visit: Payer: Medicare Other

## 2012-03-02 ENCOUNTER — Ambulatory Visit (INDEPENDENT_AMBULATORY_CARE_PROVIDER_SITE_OTHER): Payer: Medicare Other

## 2012-03-02 DIAGNOSIS — Z23 Encounter for immunization: Secondary | ICD-10-CM

## 2012-03-03 ENCOUNTER — Encounter: Payer: Self-pay | Admitting: *Deleted

## 2012-03-03 DIAGNOSIS — Z950 Presence of cardiac pacemaker: Secondary | ICD-10-CM | POA: Insufficient documentation

## 2012-03-10 ENCOUNTER — Encounter: Payer: Self-pay | Admitting: Internal Medicine

## 2012-03-10 ENCOUNTER — Ambulatory Visit (INDEPENDENT_AMBULATORY_CARE_PROVIDER_SITE_OTHER): Payer: Medicare Other | Admitting: Internal Medicine

## 2012-03-10 VITALS — BP 108/85 | HR 78 | Ht 63.0 in | Wt 210.0 lb

## 2012-03-10 DIAGNOSIS — I4891 Unspecified atrial fibrillation: Secondary | ICD-10-CM

## 2012-03-10 DIAGNOSIS — I5022 Chronic systolic (congestive) heart failure: Secondary | ICD-10-CM

## 2012-03-10 DIAGNOSIS — T82190A Other mechanical complication of cardiac electrode, initial encounter: Secondary | ICD-10-CM

## 2012-03-10 DIAGNOSIS — I4901 Ventricular fibrillation: Secondary | ICD-10-CM

## 2012-03-10 LAB — PACEMAKER DEVICE OBSERVATION
BAMS-0001: 150 {beats}/min
BRDY-0002RV: 70 {beats}/min
BRDY-0003RV: 110 {beats}/min
DEVICE MODEL PM: 2761365
RV LEAD AMPLITUDE: 12 mv
RV LEAD IMPEDENCE PM: 475 Ohm

## 2012-03-10 NOTE — Patient Instructions (Addendum)
Your physician recommends that you schedule a follow-up appointment in: 4 months with Dr Taylor  

## 2012-03-10 NOTE — Assessment & Plan Note (Signed)
Her symptoms are class IIb to class III. Hopefully reprogrammed her device will help improve cardiac performance. I've asked the patient to reduce her salt intake and to continue her current medical therapy.

## 2012-03-10 NOTE — Progress Notes (Signed)
HPI [name] returns today for followup. She is a very pleasant 78 year old woman with an ischemic cardiomyopathy, chronic systolic heart failure, left bundle branch block, hypertension, and ventricular tachycardia. In the interim, her heart failure symptoms appear to be worse. There has been a question about the functionality of her left ventricular pacing lead. The patient denies chest pain or syncope. She has class IIb to class III congestive heart failure. She notes mild peripheral edema. No cough or hemoptysis. No Known Allergies   Current Outpatient Prescriptions  Medication Sig Dispense Refill  . amiodarone (PACERONE) 200 MG tablet TAKE ONE-HALF (1/2) TABLET DAILY  45 tablet  2  . aspirin EC 81 MG tablet Take 81 mg by mouth daily.      . carvedilol (COREG) 6.25 MG tablet TAKE 1 TABLET TWICE A DAY  180 tablet  3  . clopidogrel (PLAVIX) 75 MG tablet Take 75 mg by mouth daily.      . furosemide (LASIX) 40 MG tablet Take 40 mg by mouth as needed.      . latanoprost (XALATAN) 0.005 % ophthalmic solution Place 1 drop into the left eye at bedtime.       Marland Kitchen levothyroxine (SYNTHROID, LEVOTHROID) 50 MCG tablet Take 50 mcg by mouth 3 (three) times a week.       Marland Kitchen lisinopril (PRINIVIL,ZESTRIL) 10 MG tablet Take 1 tablet (10 mg total) by mouth daily.  90 tablet  3  . rosuvastatin (CRESTOR) 10 MG tablet Take 10 mg by mouth at bedtime.      Marland Kitchen DISCONTD: furosemide (LASIX) 40 MG tablet Take 1 tablet (40 mg total) by mouth daily.  90 tablet  3  . tiotropium (SPIRIVA HANDIHALER) 18 MCG inhalation capsule Place 1 capsule (18 mcg total) into inhaler and inhale daily. As needed  30 capsule  12     Past Medical History  Diagnosis Date  . Atrial fibrillation   . Hyperlipidemia   . Myocardial infarction   . Hyperlipidemia   . Osteoarthritis   . ICD (implantable cardiac defibrillator) in place     St. Jude  . LBBB (left bundle branch block)   . Ventricular tachycardia   . Cardiomyopathy   .  Glaucoma(365)   . History of uterine cancer   . History of total hysterectomy   . Coronary artery disease     hx of MI  . Hypertension   . Hypoxia 12/12    suspect copd but pt refuses any pulmonary work up or treatment    ROS:   All systems reviewed and negative except as noted in the HPI.   Past Surgical History  Procedure Date  . Coronary artery bypass graft 1995  . Cataract extraction   . Cholecystectomy   . Coronary angioplasty   . Hernia repair   . Coronary angioplasty with stent placement   . Cardiac defibrillator placement   . Cardiac defibrillator placement   . Abdominal hysterectomy 1989    total  . Doppler echocardiography 2008     Family History  Problem Relation Age of Onset  . Heart attack Father   . Heart disease Father     MI  . Diverticulitis Mother   . Ovarian cancer Sister   . Cancer Sister     ovarian CA     History   Social History  . Marital Status: Widowed    Spouse Name: N/A    Number of Children: 1  . Years of Education: N/A   Occupational  History  .     Social History Main Topics  . Smoking status: Former Smoker    Quit date: 10/20/1982  . Smokeless tobacco: Not on file  . Alcohol Use: No  . Drug Use: No  . Sexually Active: Not on file   Other Topics Concern  . Not on file   Social History Narrative  . No narrative on file     BP 108/85  Pulse 78  Ht 5\' 3"  (1.6 m)  Wt 210 lb (95.255 kg)  BMI 37.20 kg/m2  Physical Exam:  Well appearing elderly woman, NAD HEENT: Unremarkable Neck:  No JVD, no thyromegally Lungs:  Clear with no wheezes, rales, or rhonchi. HEART:  Regular rate rhythm, no murmurs, no rubs, no clicks Abd:  soft, positive bowel sounds, no organomegally, no rebound, no guarding Ext:  2 plus pulses, no edema, no cyanosis, no clubbing Skin:  No rashes no nodules Neuro:  CN II through XII intact, motor grossly intact  EKG Normal sinus rhythm with AV sequential, biventricular pacing DEVICE  Normal  device function.  See PaceArt for details.   Assess/Plan:

## 2012-03-10 NOTE — Assessment & Plan Note (Signed)
The patient has had problems with left ventricular non- capture. Today we reprogrammed her device to make for certain satisfactory left ventricular pacing. We'll plan to see the patient back in several months.

## 2012-03-15 ENCOUNTER — Ambulatory Visit (INDEPENDENT_AMBULATORY_CARE_PROVIDER_SITE_OTHER): Payer: Medicare Other | Admitting: Family Medicine

## 2012-03-15 ENCOUNTER — Encounter: Payer: Self-pay | Admitting: Family Medicine

## 2012-03-15 VITALS — BP 118/74 | HR 76 | Temp 98.2°F | Ht 63.0 in | Wt 227.0 lb

## 2012-03-15 DIAGNOSIS — H8110 Benign paroxysmal vertigo, unspecified ear: Secondary | ICD-10-CM

## 2012-03-15 DIAGNOSIS — L723 Sebaceous cyst: Secondary | ICD-10-CM | POA: Insufficient documentation

## 2012-03-15 MED ORDER — MECLIZINE HCL 25 MG PO TABS: 25.0000 mg | ORAL_TABLET | Freq: Three times a day (TID) | ORAL | Status: DC | PRN

## 2012-03-15 MED ORDER — CEPHALEXIN 500 MG PO CAPS: 500.0000 mg | ORAL_CAPSULE | Freq: Two times a day (BID) | ORAL | Status: DC

## 2012-03-15 NOTE — Patient Instructions (Addendum)
For dizziness (vertigo)- take the meclizine as needed up to three times per day Update if not starting to improve in a week or if worsening   For the cyst under your arm- take the keflex as directed Keep clean with antibacterial soap and water under arm and neosoprin or other antibiotic ointment is ok  If this does not improve let me know

## 2012-03-15 NOTE — Progress Notes (Signed)
Subjective:    Patient ID: [name], female    DOB: 03-12-1922, 78 y.o.   MRN: [id number]  HPI Here for dizziness  Started about a week ago  Also nausea - ate some ginger Took some motion sickness med otc  Feels like she is spinning- is positional entirely  Worst to turn over in bed  No ha No fever No other symptoms   Did have to re program her pacemaker last week   Also has a red cyst under R arm that is draining Used neosporin and also iodine  No fever Is minimally painful  Patient Active Problem List  Diagnosis  . HYPERLIPIDEMIA  . GLAUCOMA  . MYOCARDIAL INFARCTION, HX OF  . CARDIOMYOPATHY, ISCHEMIC  . LBBB  . Atrial fibrillation  . Ventricular fibrillation  . AAA  . OSTEOARTHRITIS  . UTERINE CANCER, HX OF  . Hypoxia  . Bronchitis  . Chronic systolic heart failure  . COPD (chronic obstructive pulmonary disease)  . Chronic low back pain  . Pacemaker-St.Jude  . Mechanical complication due to cardiac pacemaker (electrode)   Past Medical History  Diagnosis Date  . Atrial fibrillation   . Hyperlipidemia   . Myocardial infarction   . Hyperlipidemia   . Osteoarthritis   . ICD (implantable cardiac defibrillator) in place     St. Jude  . LBBB (left bundle branch block)   . Ventricular tachycardia   . Cardiomyopathy   . Glaucoma(365)   . History of uterine cancer   . History of total hysterectomy   . Coronary artery disease     hx of MI  . Hypertension   . Hypoxia 12/12    suspect copd but pt refuses any pulmonary work up or treatment   Past Surgical History  Procedure Date  . Coronary artery bypass graft 1995  . Cataract extraction   . Cholecystectomy   . Coronary angioplasty   . Hernia repair   . Coronary angioplasty with stent placement   . Cardiac defibrillator placement   . Cardiac defibrillator placement   . Abdominal hysterectomy 1989    total  . Doppler echocardiography 2008   History  Substance Use Topics  . Smoking status:  Former Smoker    Quit date: 10/20/1982  . Smokeless tobacco: Not on file  . Alcohol Use: No   Family History  Problem Relation Age of Onset  . Heart attack Father   . Heart disease Father     MI  . Diverticulitis Mother   . Ovarian cancer Sister   . Cancer Sister     ovarian CA   No Known Allergies Current Outpatient Prescriptions on File Prior to Visit  Medication Sig Dispense Refill  . amiodarone (PACERONE) 200 MG tablet TAKE ONE-HALF (1/2) TABLET DAILY  45 tablet  2  . aspirin EC 81 MG tablet Take 81 mg by mouth daily.      . carvedilol (COREG) 6.25 MG tablet TAKE 1 TABLET TWICE A DAY  180 tablet  3  . clopidogrel (PLAVIX) 75 MG tablet Take 75 mg by mouth daily.      . furosemide (LASIX) 40 MG tablet Take 40 mg by mouth as needed.      . latanoprost (XALATAN) 0.005 % ophthalmic solution Place 1 drop into the left eye at bedtime.       Marland Kitchen levothyroxine (SYNTHROID, LEVOTHROID) 50 MCG tablet Take 50 mcg by mouth 3 (three) times a week.       Marland Kitchen lisinopril (  PRINIVIL,ZESTRIL) 10 MG tablet Take 1 tablet (10 mg total) by mouth daily.  90 tablet  3  . rosuvastatin (CRESTOR) 10 MG tablet Take 10 mg by mouth at bedtime.      Marland Kitchen tiotropium (SPIRIVA HANDIHALER) 18 MCG inhalation capsule Place 1 capsule (18 mcg total) into inhaler and inhale daily. As needed  30 capsule  12       Review of Systems Review of Systems  Constitutional: Negative for fever, appetite change, fatigue and unexpected weight change.  Eyes: Negative for pain and visual disturbance.  Respiratory: Negative for cough and shortness of breath.   Cardiovascular: Negative for cp or palpitations    Gastrointestinal: Negative for nausea, diarrhea and constipation.  Genitourinary: Negative for urgency and frequency.  Skin: Negative for pallor or rash  pos for red cyst under arm  Neurological: Negative for weakness, , numbness and headaches. pos for dizziness Hematological: Negative for adenopathy. Does not bruise/bleed  easily.  Psychiatric/Behavioral: Negative for dysphoric mood. The patient is not nervous/anxious.         Objective:   Physical Exam  Constitutional: She appears well-developed and well-nourished. No distress.  HENT:  Head: Normocephalic and atraumatic.  Mouth/Throat: Oropharynx is clear and moist.  Eyes: Conjunctivae normal and EOM are normal. Pupils are equal, round, and reactive to light. No scleral icterus.       2-3 beats of horizontal nystagmus bilaterally  Neck: Normal range of motion. Neck supple. No JVD present. Carotid bruit is not present. No thyromegaly present.  Cardiovascular: Normal rate and regular rhythm.   Pulmonary/Chest: Effort normal and breath sounds normal. No respiratory distress. She has no wheezes.  Abdominal: Soft. Bowel sounds are normal. She exhibits no distension, no abdominal bruit and no mass. There is no tenderness.  Musculoskeletal: She exhibits no edema.  Lymphadenopathy:    She has no cervical adenopathy.  Neurological: She is alert. She has normal reflexes. She displays no atrophy and no tremor. No cranial nerve deficit or sensory deficit. She exhibits normal muscle tone. She displays a negative Romberg sign. Coordination and gait normal.       No focal cerebellar signs  Nl gait  Is not very dizzy today  Skin: Skin is warm and dry. No rash noted. There is erythema. No pallor.       Cyst under R axilla 1 cm and superficial Some caseous material expressed  Some erythema, mildly tender No LN noted  Psychiatric: She has a normal mood and affect.          Assessment & Plan:

## 2012-03-20 ENCOUNTER — Telehealth: Payer: Self-pay | Admitting: *Deleted

## 2012-03-20 NOTE — Telephone Encounter (Signed)
Notified pt of Dr. Melvia Heaps instructions and to call us back if sxs (memory or vertigo) persist

## 2012-03-20 NOTE — Telephone Encounter (Signed)
Stop it and let me know if symptoms (memory or vertigo) persist This medicine can be sedating and effect concentration

## 2012-03-20 NOTE — Telephone Encounter (Signed)
Pt calls requesting to speak with you, she states the vertigo med is affecting her memory (she was unable to remember her son's birthday among other things"

## 2012-03-23 NOTE — Addendum Note (Signed)
Addended by: Marrion Coy L on: 03/23/2012 03:32 PM   Modules accepted: Orders

## 2012-03-28 ENCOUNTER — Encounter: Payer: Self-pay | Admitting: Internal Medicine

## 2012-04-06 ENCOUNTER — Telehealth: Payer: Self-pay

## 2012-04-06 DIAGNOSIS — H8110 Benign paroxysmal vertigo, unspecified ear: Secondary | ICD-10-CM

## 2012-04-06 NOTE — Telephone Encounter (Signed)
I need to go ahead and refer her to ENT for further evaluation of this  Let her know that [name] will be calling her Ref done

## 2012-04-06 NOTE — Telephone Encounter (Signed)
OTC meclizine for dizziness is not helping dizziness. Pt still has dizziness worse upon movement, no h/a. Pt request another med called to [facility]. Pt is using walker and advised caution when moving about.Please advise.

## 2012-04-06 NOTE — Telephone Encounter (Signed)
Pt notified, an advise that [name] will be calling her soon

## 2012-05-10 ENCOUNTER — Other Ambulatory Visit: Payer: Self-pay | Admitting: Internal Medicine

## 2012-07-05 ENCOUNTER — Other Ambulatory Visit: Payer: Self-pay | Admitting: Cardiology

## 2012-07-05 DIAGNOSIS — I5022 Chronic systolic (congestive) heart failure: Secondary | ICD-10-CM

## 2012-07-05 MED ORDER — LISINOPRIL 10 MG PO TABS: 10.0000 mg | ORAL_TABLET | Freq: Every day | ORAL | Status: DC

## 2012-07-05 MED ORDER — ROSUVASTATIN CALCIUM 10 MG PO TABS: 10.0000 mg | ORAL_TABLET | Freq: Every day | ORAL | Status: DC

## 2012-07-05 MED ORDER — AMIODARONE HCL 200 MG PO TABS: ORAL_TABLET | ORAL | Status: DC

## 2012-07-07 ENCOUNTER — Telehealth: Payer: Self-pay | Admitting: Internal Medicine

## 2012-07-07 NOTE — Telephone Encounter (Signed)
Spoke with patient let her know she was scheduled to see Dr Ladona Ridgel on Tues and needed to keep that appointment.  She states she can not walk in here from her car.  I let her know that maybe she needed someone to bring her to her appointment.  She sates she is fine and will continue with her Spiriva and if she feels like she needs to come in before April she will call

## 2012-07-07 NOTE — Telephone Encounter (Signed)
New Problem    Pt called in to reschedule her appt with Dr. Ladona Ridgel. She is complaining of SOB, she states its hard for her to get around.

## 2012-07-11 ENCOUNTER — Encounter: Payer: Medicare Other | Admitting: Internal Medicine

## 2012-07-17 ENCOUNTER — Telehealth: Payer: Self-pay | Admitting: Internal Medicine

## 2012-07-17 ENCOUNTER — Other Ambulatory Visit: Payer: Self-pay | Admitting: Internal Medicine

## 2012-07-17 DIAGNOSIS — I5022 Chronic systolic (congestive) heart failure: Secondary | ICD-10-CM

## 2012-07-17 MED ORDER — ROSUVASTATIN CALCIUM 10 MG PO TABS: 10.0000 mg | ORAL_TABLET | Freq: Every day | ORAL | Status: DC

## 2012-07-17 MED ORDER — AMIODARONE HCL 200 MG PO TABS: ORAL_TABLET | ORAL | Status: DC

## 2012-07-17 MED ORDER — LISINOPRIL 10 MG PO TABS: 10.0000 mg | ORAL_TABLET | Freq: Every day | ORAL | Status: DC

## 2012-07-17 NOTE — Telephone Encounter (Signed)
Pt needs refill amidarone, lisinopril, and crestor  [facility]

## 2012-07-18 NOTE — Telephone Encounter (Signed)
rx has been refilled on 07/17/12

## 2012-08-31 ENCOUNTER — Encounter: Payer: Self-pay | Admitting: Internal Medicine

## 2012-08-31 ENCOUNTER — Ambulatory Visit (INDEPENDENT_AMBULATORY_CARE_PROVIDER_SITE_OTHER): Payer: 59 | Admitting: Internal Medicine

## 2012-08-31 VITALS — BP 160/88 | HR 74 | Ht 63.0 in | Wt 227.0 lb

## 2012-08-31 DIAGNOSIS — I2589 Other forms of chronic ischemic heart disease: Secondary | ICD-10-CM

## 2012-08-31 DIAGNOSIS — I4891 Unspecified atrial fibrillation: Secondary | ICD-10-CM

## 2012-08-31 DIAGNOSIS — I4901 Ventricular fibrillation: Secondary | ICD-10-CM

## 2012-08-31 DIAGNOSIS — I5022 Chronic systolic (congestive) heart failure: Secondary | ICD-10-CM

## 2012-08-31 DIAGNOSIS — Z950 Presence of cardiac pacemaker: Secondary | ICD-10-CM

## 2012-08-31 LAB — PACEMAKER DEVICE OBSERVATION
AL AMPLITUDE: 1.1 mv
AL IMPEDENCE PM: 337.5 Ohm
AL THRESHOLD: 0.5 V
DEVICE MODEL PM: 2761365
RV LEAD AMPLITUDE: 12 mv
RV LEAD IMPEDENCE PM: 375 Ohm
RV LEAD THRESHOLD: 1.25 V

## 2012-08-31 MED ORDER — FUROSEMIDE 40 MG PO TABS: ORAL_TABLET | ORAL | Status: DC

## 2012-08-31 NOTE — Progress Notes (Signed)
HPI [name] returns today for followup. She is a very pleasant 79 year old woman with a history of ventricular tachycardia, chronic systolic heart failure, atrial fibrillation, and complete heart block, status post permanent pacemaker insertion. She is been stable in the interim except that she has developed worsening peripheral edema. She admits to sodium indiscretion. She does not like to take her diuretic. No Known Allergies   Current Outpatient Prescriptions  Medication Sig Dispense Refill  . amiodarone (PACERONE) 200 MG tablet Take 1/2 tablet by mouth daily.      Marland Kitchen aspirin EC 81 MG tablet Take 81 mg by mouth daily.      . carvedilol (COREG) 6.25 MG tablet TAKE 1 TABLET TWICE A DAY  180 tablet  3  . cephALEXin (KEFLEX) 500 MG capsule Take 1 capsule (500 mg total) by mouth 2 (two) times daily. For 7 days  14 capsule  0  . clopidogrel (PLAVIX) 75 MG tablet Take 75 mg by mouth daily.      . clopidogrel (PLAVIX) 75 MG tablet TAKE 1 TABLET DAILY  90 tablet  2  . furosemide (LASIX) 40 MG tablet Take one tablet daily  30 tablet  3  . latanoprost (XALATAN) 0.005 % ophthalmic solution Place 1 drop into the left eye at bedtime.       Marland Kitchen levothyroxine (SYNTHROID, LEVOTHROID) 50 MCG tablet Take 50 mcg by mouth 3 (three) times a week.       Marland Kitchen lisinopril (PRINIVIL,ZESTRIL) 10 MG tablet Take 1 tablet (10 mg total) by mouth daily.  90 tablet  3  . meclizine (ANTIVERT) 25 MG tablet Take 1 tablet (25 mg total) by mouth 3 (three) times daily as needed for dizziness (watch out for sedation ).  30 tablet  0  . rosuvastatin (CRESTOR) 10 MG tablet Take 1 tablet (10 mg total) by mouth at bedtime.  90 tablet  3  . tiotropium (SPIRIVA HANDIHALER) 18 MCG inhalation capsule Place 1 capsule (18 mcg total) into inhaler and inhale daily. As needed  30 capsule  12   No current facility-administered medications for this visit.     Past Medical History  Diagnosis Date  . Atrial fibrillation   . Hyperlipidemia    . Myocardial infarction   . Hyperlipidemia   . Osteoarthritis   . ICD (implantable cardiac defibrillator) in place     St. Jude  . LBBB (left bundle branch block)   . Ventricular tachycardia   . Cardiomyopathy   . Glaucoma(365)   . History of uterine cancer   . History of total hysterectomy   . Coronary artery disease     hx of MI  . Hypertension   . Hypoxia 12/12    suspect copd but pt refuses any pulmonary work up or treatment    ROS:   All systems reviewed and negative except as noted in the HPI.   Past Surgical History  Procedure Laterality Date  . Coronary artery bypass graft  1995  . Cataract extraction    . Cholecystectomy    . Coronary angioplasty    . Hernia repair    . Coronary angioplasty with stent placement    . Cardiac defibrillator placement    . Cardiac defibrillator placement    . Abdominal hysterectomy  1989    total  . Doppler echocardiography  2008     Family History  Problem Relation Age of Onset  . Heart attack Father   . Heart disease Father  MI  . Diverticulitis Mother   . Ovarian cancer Sister   . Cancer Sister     ovarian CA     History   Social History  . Marital Status: Widowed    Spouse Name: N/A    Number of Children: 1  . Years of Education: N/A   Occupational History  .     Social History Main Topics  . Smoking status: Former Smoker    Quit date: 10/20/1982  . Smokeless tobacco: Not on file  . Alcohol Use: No  . Drug Use: No  . Sexually Active: Not on file   Other Topics Concern  . Not on file   Social History Narrative  . No narrative on file     BP 160/88  Pulse 74  Ht 5\' 3"  (1.6 m)  Wt 227 lb (102.967 kg)  BMI 40.22 kg/m2  Physical Exam:  Obese but Well appearing elderly woman,NAD HEENT: Unremarkable Neck:  7 cm JVD, no thyromegally Lungs:  Clear except for scattered basilar rales HEART:  Regular rate rhythm, no murmurs, no rubs, no clicks Abd:  soft, positive bowel sounds, no  organomegally, no rebound, no guarding Ext:  2 plus pulses, 2+ peripheral edema, no cyanosis, no clubbing Skin:  No rashes no nodules Neuro:  CN II through XII intact, motor grossly intact  DEVICE  Normal device function.  See PaceArt for details.   Assess/Plan:

## 2012-08-31 NOTE — Assessment & Plan Note (Signed)
The patient is weekend. She is a history of falls. She'll continue her Plavix. Her ventricular rate is well controlled.

## 2012-08-31 NOTE — Assessment & Plan Note (Signed)
She remains sedentary but denies anginal symptoms. No change in medical therapy.

## 2012-08-31 NOTE — Assessment & Plan Note (Signed)
Her biventricular pacemaker is working normally. We'll plan to recheck in several months. 

## 2012-08-31 NOTE — Patient Instructions (Addendum)
Your physician wants you to follow-up in: 6 months with device clinic and 12 months with Dr Court Joy will receive a reminder letter in the mail two months in advance. If you don't receive a letter, please call our office to schedule the follow-up appointment.   Your physician has recommended you make the following change in your medication:  1) Increase Furosemide to 1 tablet daily

## 2012-08-31 NOTE — Assessment & Plan Note (Addendum)
Her chronic systolic heart failure is class II. She does have some volume overload. I've asked the patient to take one whole tablet Lasix, 40 milligrams daily, as directed. She is encouraged to maintain a low-sodium diet.

## 2012-10-16 ENCOUNTER — Telehealth: Payer: Self-pay | Admitting: Internal Medicine

## 2012-10-16 MED ORDER — CARVEDILOL 6.25 MG PO TABS: 6.2500 mg | ORAL_TABLET | Freq: Two times a day (BID) | ORAL | Status: DC

## 2012-10-16 MED ORDER — LEVOTHYROXINE SODIUM 50 MCG PO TABS: 50.0000 ug | ORAL_TABLET | ORAL | Status: DC

## 2012-10-16 NOTE — Telephone Encounter (Signed)
Sent in her 2 Rx's and let her know when her follow up is

## 2012-10-16 NOTE — Telephone Encounter (Signed)
Error     Pt has some questions regarding medication (THYROXINE). Please call.

## 2012-10-25 ENCOUNTER — Other Ambulatory Visit: Payer: Self-pay | Admitting: Internal Medicine

## 2012-12-19 ENCOUNTER — Telehealth: Payer: Self-pay | Admitting: Internal Medicine

## 2012-12-19 MED ORDER — CLOPIDOGREL BISULFATE 75 MG PO TABS: 75.0000 mg | ORAL_TABLET | Freq: Every day | ORAL | Status: DC

## 2012-12-19 NOTE — Telephone Encounter (Signed)
S/W pt about her Plavix. Refill sent to [facility]. Pt aware.

## 2012-12-19 NOTE — Telephone Encounter (Signed)
New Prob  Pt states she needs a prescription for plavix faxed to [facility].  She said that she needs to speak with you first.

## 2013-03-01 ENCOUNTER — Ambulatory Visit (INDEPENDENT_AMBULATORY_CARE_PROVIDER_SITE_OTHER): Payer: 59 | Admitting: *Deleted

## 2013-03-01 DIAGNOSIS — I5022 Chronic systolic (congestive) heart failure: Secondary | ICD-10-CM

## 2013-03-01 DIAGNOSIS — Z950 Presence of cardiac pacemaker: Secondary | ICD-10-CM

## 2013-03-01 DIAGNOSIS — I2589 Other forms of chronic ischemic heart disease: Secondary | ICD-10-CM

## 2013-03-01 LAB — PACEMAKER DEVICE OBSERVATION
AL AMPLITUDE: 1 mv
ATRIAL PACING PM: 99
BAMS-0001: 150 {beats}/min
BAMS-0003: 70 {beats}/min
BATTERY VOLTAGE: 2.9178 V
LV LEAD THRESHOLD: 1.5 V
VENTRICULAR PACING PM: 99

## 2013-03-01 NOTE — Progress Notes (Signed)
CRT-P check in clinic, all functions normal, see PaceArt for full details.      229 mode switches---nearly all Atrial lead noise, was able to reproduce in clinic, printed freeze capture to confirm.  ROV w/ Dr. Ladona Ridgel in 6 mo.  [employer]

## 2013-03-09 ENCOUNTER — Ambulatory Visit (INDEPENDENT_AMBULATORY_CARE_PROVIDER_SITE_OTHER): Payer: 59

## 2013-03-09 DIAGNOSIS — Z23 Encounter for immunization: Secondary | ICD-10-CM

## 2013-03-21 ENCOUNTER — Encounter: Payer: Self-pay | Admitting: Internal Medicine

## 2013-04-11 ENCOUNTER — Encounter: Payer: Self-pay | Admitting: Family Medicine

## 2013-04-11 ENCOUNTER — Ambulatory Visit (INDEPENDENT_AMBULATORY_CARE_PROVIDER_SITE_OTHER): Payer: 59 | Admitting: Family Medicine

## 2013-04-11 VITALS — BP 126/71 | HR 70 | Temp 98.3°F | Ht 63.0 in | Wt 229.5 lb

## 2013-04-11 DIAGNOSIS — L723 Sebaceous cyst: Secondary | ICD-10-CM

## 2013-04-11 DIAGNOSIS — J449 Chronic obstructive pulmonary disease, unspecified: Secondary | ICD-10-CM

## 2013-04-11 MED ORDER — ALBUTEROL SULFATE HFA 108 (90 BASE) MCG/ACT IN AERS: 2.0000 | INHALATION_SPRAY | RESPIRATORY_TRACT | Status: DC | PRN

## 2013-04-11 NOTE — Progress Notes (Signed)
Pre-visit discussion using our clinic review tool. No additional management support is needed unless otherwise documented below in the visit note.  

## 2013-04-11 NOTE — Progress Notes (Signed)
Subjective:    Patient ID: [name], female    DOB: 1921-11-29, 79 y.o.   MRN: [id number]  HPI Here for a "spot" on her back she wants checked  Also breathing issues/copd   Her family noted a spot on her back  Put neosoporin and a band aid on it  The spot is not painful at all   Has copd  Has declined 02 and pulm f/u for this  O2 sat is 92%  She has had spiriva in the past 2 times  Also an albuterol inhaler (she does not need it unless out or really exerts herself)   She just wants to have albuterol for prn use - does not think she needs the spiriva (in addition it is very expensive)  Feels good most of the time  Not coughing  Declines pul eval and declines tx with O2 that was recommended after last hosp  Had her flu shot utd pneumovax   Does not smoke   Patient Active Problem List   Diagnosis Date Noted  . Vertigo, benign positional 03/15/2012  . Sebaceous cyst of right axilla 03/15/2012  . Mechanical complication due to cardiac pacemaker (electrode) 03/10/2012  . Pacemaker-St.Jude 03/03/2012  . COPD (chronic obstructive pulmonary disease) 01/28/2012  . Chronic low back pain 01/28/2012  . Chronic systolic heart failure 10/20/2011  . Bronchitis 05/07/2011  . Hypoxia 04/23/2011  . AAA 09/09/2009  . LBBB 06/05/2008  . Ventricular fibrillation 06/05/2008  . HYPERLIPIDEMIA 01/03/2007  . GLAUCOMA 01/03/2007  . MYOCARDIAL INFARCTION, HX OF 01/03/2007  . CARDIOMYOPATHY, ISCHEMIC 01/03/2007  . Atrial fibrillation 01/03/2007  . OSTEOARTHRITIS 01/03/2007  . UTERINE CANCER, HX OF 01/03/2007   Past Medical History  Diagnosis Date  . Atrial fibrillation   . Hyperlipidemia   . Myocardial infarction   . Hyperlipidemia   . Osteoarthritis   . ICD (implantable cardiac defibrillator) in place     St. Jude  . LBBB (left bundle branch block)   . Ventricular tachycardia   . Cardiomyopathy   . Glaucoma   . History of uterine cancer   . History of total hysterectomy    . Coronary artery disease     hx of MI  . Hypertension   . Hypoxia 12/12    suspect copd but pt refuses any pulmonary work up or treatment   Past Surgical History  Procedure Laterality Date  . Coronary artery bypass graft  1995  . Cataract extraction    . Cholecystectomy    . Coronary angioplasty    . Hernia repair    . Coronary angioplasty with stent placement    . Cardiac defibrillator placement    . Cardiac defibrillator placement    . Abdominal hysterectomy  1989    total  . Doppler echocardiography  2008   History  Substance Use Topics  . Smoking status: Former Smoker    Quit date: 10/20/1982  . Smokeless tobacco: Not on file  . Alcohol Use: No   Family History  Problem Relation Age of Onset  . Heart attack Father   . Heart disease Father     MI  . Diverticulitis Mother   . Ovarian cancer Sister   . Cancer Sister     ovarian CA   No Known Allergies Current Outpatient Prescriptions on File Prior to Visit  Medication Sig Dispense Refill  . amiodarone (PACERONE) 200 MG tablet Take 1/2 tablet by mouth daily.      Marland Kitchen aspirin EC 81 MG  tablet Take 81 mg by mouth daily.      . carvedilol (COREG) 6.25 MG tablet Take 1 tablet (6.25 mg total) by mouth 2 (two) times daily.  180 tablet  3  . clopidogrel (PLAVIX) 75 MG tablet Take 1 tablet (75 mg total) by mouth daily.  90 tablet  3  . furosemide (LASIX) 40 MG tablet TAKE ONE TABLET DAILY  90 tablet  3  . latanoprost (XALATAN) 0.005 % ophthalmic solution Place 1 drop into the left eye at bedtime.       Marland Kitchen levothyroxine (SYNTHROID, LEVOTHROID) 50 MCG tablet Take 1 tablet (50 mcg total) by mouth 3 (three) times a week.  36 tablet  3  . lisinopril (PRINIVIL,ZESTRIL) 10 MG tablet Take 1 tablet (10 mg total) by mouth daily.  90 tablet  3  . meclizine (ANTIVERT) 25 MG tablet Take 1 tablet (25 mg total) by mouth 3 (three) times daily as needed for dizziness (watch out for sedation ).  30 tablet  0  . rosuvastatin (CRESTOR) 10 MG  tablet Take 1 tablet (10 mg total) by mouth at bedtime.  90 tablet  3  . tiotropium (SPIRIVA HANDIHALER) 18 MCG inhalation capsule Place 1 capsule (18 mcg total) into inhaler and inhale daily. As needed  30 capsule  12   No current facility-administered medications on file prior to visit.     Review of Systems Review of Systems  Constitutional: Negative for fever, appetite change, fatigue and unexpected weight change.  Eyes: Negative for pain and visual disturbance.  Respiratory: Negative for cough and pos for intermittent wheeze/sob on exertion (baseline) Cardiovascular: Negative for cp or palpitations    Gastrointestinal: Negative for nausea, diarrhea and constipation.  Genitourinary: Negative for urgency and frequency.  Skin: Negative for pallor or rash  pos for lesion on back that has drained  Neurological: Negative for weakness, light-headedness, numbness and headaches.  Hematological: Negative for adenopathy. Does not bruise/bleed easily.  Psychiatric/Behavioral: Negative for dysphoric mood. The patient is not nervous/anxious.         Objective:   Physical Exam  Constitutional: She appears well-developed and well-nourished. No distress.  obese and well appearing   HENT:  Head: Normocephalic and atraumatic.  Mouth/Throat: Oropharynx is clear and moist.  Eyes: Conjunctivae and EOM are normal. Pupils are equal, round, and reactive to light. No scleral icterus.  Neck: Normal range of motion. Neck supple.  Cardiovascular: Normal rate and regular rhythm.  Exam reveals no gallop.   Pulmonary/Chest: Effort normal and breath sounds normal. No respiratory distress. She has no wheezes. She has no rales. She exhibits no tenderness.  Diffusely distant bs  Not wheezing today  Musculoskeletal: She exhibits no edema and no tenderness.  Lymphadenopathy:    She has no cervical adenopathy.  Neurological: She is alert. She has normal reflexes. No cranial nerve deficit. She exhibits normal  muscle tone. Coordination normal.  Skin: Skin is warm and dry.  1 cm lesion on mid back - with scant redness and comedonal opening  Mod amt of sebacous material expressed without pus  Non tender Dressed with bactroban and band aid after cleansing   Psychiatric: She has a normal mood and affect.          Assessment & Plan:

## 2013-04-11 NOTE — Patient Instructions (Signed)
Use the albuterol inhaler as needed for wheezing If you find you need to use it more than 2-3 times per week- then we will need to start back with a daily maintenance inhaler as well (like spiriva) You have what looks like an inflamed sebaceous cyst on your back - I was able to squeeze (express) a core out of it and it should start to heal now  If you can keep it clean with soap and water and use the antibacterial ointment I gave you - that would be best  If it hurts or gets bigger -please come back

## 2013-04-15 NOTE — Assessment & Plan Note (Signed)
Contents expressed with relief Does not app to be infected Disc dressing with abx oint after cleansing regularly with antibact soap and water Update if not starting to improve in a week or if worsening  -esp if pain or redness

## 2013-04-15 NOTE — Assessment & Plan Note (Signed)
Refilled albuterol today  Pt declines to use mt med at this time - or 02 She is baseline sob on exertion Stressed that if she needs albuterol rescue inhaler more than 2 times weekly - we need to re start spiriva or try steroid inhaler

## 2013-07-09 ENCOUNTER — Other Ambulatory Visit: Payer: Self-pay | Admitting: *Deleted

## 2013-07-09 DIAGNOSIS — I5022 Chronic systolic (congestive) heart failure: Secondary | ICD-10-CM

## 2013-07-09 MED ORDER — LISINOPRIL 10 MG PO TABS: 10.0000 mg | ORAL_TABLET | Freq: Every day | ORAL | Status: DC

## 2013-08-01 ENCOUNTER — Other Ambulatory Visit: Payer: Self-pay | Admitting: *Deleted

## 2013-08-01 MED ORDER — ROSUVASTATIN CALCIUM 10 MG PO TABS: 10.0000 mg | ORAL_TABLET | Freq: Every day | ORAL | Status: DC

## 2013-09-04 ENCOUNTER — Other Ambulatory Visit: Payer: Self-pay | Admitting: Internal Medicine

## 2013-09-05 ENCOUNTER — Encounter: Payer: Self-pay | Admitting: Internal Medicine

## 2013-09-05 ENCOUNTER — Ambulatory Visit (INDEPENDENT_AMBULATORY_CARE_PROVIDER_SITE_OTHER): Payer: 59 | Admitting: Internal Medicine

## 2013-09-05 VITALS — BP 124/82 | HR 74 | Ht 63.0 in | Wt 231.0 lb

## 2013-09-05 DIAGNOSIS — I5022 Chronic systolic (congestive) heart failure: Secondary | ICD-10-CM

## 2013-09-05 DIAGNOSIS — Z950 Presence of cardiac pacemaker: Secondary | ICD-10-CM

## 2013-09-05 DIAGNOSIS — I4891 Unspecified atrial fibrillation: Secondary | ICD-10-CM

## 2013-09-05 DIAGNOSIS — I447 Left bundle-branch block, unspecified: Secondary | ICD-10-CM

## 2013-09-05 LAB — MDC_IDC_ENUM_SESS_TYPE_INCLINIC
Brady Statistic RV Percent Paced: 99.45 %
Implantable Pulse Generator Model: 3210
Implantable Pulse Generator Serial Number: 2761365
Lead Channel Impedance Value: 462.5 Ohm
Lead Channel Impedance Value: 487.5 Ohm
Lead Channel Pacing Threshold Amplitude: 0.5 V
Lead Channel Pacing Threshold Amplitude: 0.5 V
Lead Channel Pacing Threshold Amplitude: 1 V
Lead Channel Pacing Threshold Amplitude: 1.75 V
Lead Channel Pacing Threshold Amplitude: 1.75 V
Lead Channel Pacing Threshold Pulse Width: 0.4 ms
Lead Channel Pacing Threshold Pulse Width: 0.5 ms
Lead Channel Pacing Threshold Pulse Width: 0.8 ms
Lead Channel Sensing Intrinsic Amplitude: 1 mV
Lead Channel Setting Pacing Amplitude: 2 V
Lead Channel Setting Pacing Amplitude: 2.75 V
Lead Channel Setting Pacing Pulse Width: 0.4 ms
MDC IDC MSMT BATTERY REMAINING LONGEVITY: 30 mo
MDC IDC MSMT BATTERY VOLTAGE: 2.92 V
MDC IDC MSMT LEADCHNL LV PACING THRESHOLD PULSEWIDTH: 0.8 ms
MDC IDC MSMT LEADCHNL RA IMPEDANCE VALUE: 300 Ohm
MDC IDC MSMT LEADCHNL RA PACING THRESHOLD PULSEWIDTH: 0.5 ms
MDC IDC MSMT LEADCHNL RV PACING THRESHOLD AMPLITUDE: 1 V
MDC IDC MSMT LEADCHNL RV PACING THRESHOLD PULSEWIDTH: 0.4 ms
MDC IDC MSMT LEADCHNL RV SENSING INTR AMPL: 12 mV
MDC IDC SESS DTM: 20150408185622
MDC IDC SET LEADCHNL LV PACING PULSEWIDTH: 0.8 ms
MDC IDC SET LEADCHNL RV PACING AMPLITUDE: 2.5 V
MDC IDC SET LEADCHNL RV SENSING SENSITIVITY: 6 mV
MDC IDC STAT BRADY RA PERCENT PACED: 99 %

## 2013-09-05 NOTE — Assessment & Plan Note (Signed)
She has class 2 symptoms. She is limited by her severe arthritis. Will continue her current meds.

## 2013-09-05 NOTE — Patient Instructions (Signed)
Your physician recommends that you schedule a follow-up appointment in: 6 months with the device clinic and 12 months with Dr.Taylor

## 2013-09-05 NOTE — Progress Notes (Addendum)
HPI [name] returns today for followup. She is a very pleasant 80 year old woman with a history of ventricular tachycardia, chronic systolic heart failure, atrial fibrillation, and complete heart block, status post permanent Biventricular pacemaker insertion. She is been stable in the interim except that she has developed worsening lower back pain. She appears to have compression fractures in her lower back and has visibly lost height over the years. She admits to sodium indiscretion. She does not like to take her diuretic. She has chronic peripheral edema. No Known Allergies   Current Outpatient Prescriptions  Medication Sig Dispense Refill  . albuterol (PROVENTIL HFA;VENTOLIN HFA) 108 (90 BASE) MCG/ACT inhaler Inhale 2 puffs into the lungs every 4 (four) hours as needed for wheezing or shortness of breath.  1 Inhaler  5  . amiodarone (PACERONE) 200 MG tablet Take 1/2 tablet by mouth daily.      Marland Kitchen. aspirin EC 81 MG tablet Take 81 mg by mouth daily.      . carvedilol (COREG) 6.25 MG tablet Take 1 tablet (6.25 mg total) by mouth 2 (two) times daily.  180 tablet  3  . clopidogrel (PLAVIX) 75 MG tablet Take 1 tablet (75 mg total) by mouth daily.  90 tablet  3  . furosemide (LASIX) 40 MG tablet TAKE ONE TABLET DAILY (Pt states she only takes this when she is going to be at home all day-09/05/13)      . latanoprost (XALATAN) 0.005 % ophthalmic solution Place 1 drop into the left eye at bedtime.       Marland Kitchen. levothyroxine (SYNTHROID, LEVOTHROID) 50 MCG tablet Take 50 mcg by mouth 3 (three) times a week. Pt states she only takes this every once in a while (09/05/13)      . lisinopril (PRINIVIL,ZESTRIL) 10 MG tablet TAKE 1 TABLET EVERY DAY  90 tablet  0  . meclizine (ANTIVERT) 25 MG tablet Take 1 tablet (25 mg total) by mouth 3 (three) times daily as needed for dizziness (watch out for sedation ).  30 tablet  0  . rosuvastatin (CRESTOR) 10 MG tablet Take 1 tablet (10 mg total) by mouth at bedtime.  90 tablet  0    No current facility-administered medications for this visit.     Past Medical History  Diagnosis Date  . Atrial fibrillation   . Hyperlipidemia   . Myocardial infarction   . Hyperlipidemia   . Osteoarthritis   . ICD (implantable cardiac defibrillator) in place     St. Jude  . LBBB (left bundle branch block)   . Ventricular tachycardia   . Cardiomyopathy   . Glaucoma   . History of uterine cancer   . History of total hysterectomy   . Coronary artery disease     hx of MI  . Hypertension   . Hypoxia 12/12    suspect copd but pt refuses any pulmonary work up or treatment    ROS:   All systems reviewed and negative except as noted in the HPI.   Past Surgical History  Procedure Laterality Date  . Coronary artery bypass graft  1995  . Cataract extraction    . Cholecystectomy    . Coronary angioplasty    . Hernia repair    . Coronary angioplasty with stent placement    . Cardiac defibrillator placement    . Cardiac defibrillator placement    . Abdominal hysterectomy  1989    total  . Doppler echocardiography  2008     Family History  Problem Relation Age of Onset  . Heart attack Father   . Heart disease Father     MI  . Diverticulitis Mother   . Ovarian cancer Sister   . Cancer Sister     ovarian CA     History   Social History  . Marital Status: Widowed    Spouse Name: N/A    Number of Children: 1  . Years of Education: N/A   Occupational History  .     Social History Main Topics  . Smoking status: Former Smoker    Quit date: 10/20/1982  . Smokeless tobacco: Not on file  . Alcohol Use: No  . Drug Use: No  . Sexual Activity: Not on file   Other Topics Concern  . Not on file   Social History Narrative  . No narrative on file     BP 124/82  Pulse 74  Ht 5\' 3"  (1.6 m)  Wt 231 lb (104.781 kg)  BMI 40.93 kg/m2  Physical Exam:  Obese but Well appearing elderly woman,NAD HEENT: Unremarkable Neck:  7 cm JVD, no thyromegally Lungs:   Clear except for scattered basilar rales HEART:  Regular rate rhythm, no murmurs, no rubs, no clicks Abd:  soft, positive bowel sounds, no organomegally, no rebound, no guarding Ext:  2 plus pulses, 2+ peripheral edema, no cyanosis, no clubbing Skin:  No rashes no nodules Neuro:  CN II through XII intact, motor grossly intact  ECG - AV paced  DEVICE  Normal device function.  See PaceArt for details.   Assess/Plan:

## 2013-09-05 NOTE — Assessment & Plan Note (Signed)
Her St. Jude BiV PM is working normally. Will recheck in several months. 

## 2013-10-15 ENCOUNTER — Ambulatory Visit (INDEPENDENT_AMBULATORY_CARE_PROVIDER_SITE_OTHER): Payer: 59 | Admitting: Family Medicine

## 2013-10-15 ENCOUNTER — Encounter: Payer: Self-pay | Admitting: Family Medicine

## 2013-10-15 VITALS — BP 132/88 | HR 71 | Temp 98.5°F | Ht 63.0 in | Wt 233.5 lb

## 2013-10-15 DIAGNOSIS — Z23 Encounter for immunization: Secondary | ICD-10-CM

## 2013-10-15 DIAGNOSIS — W19XXXA Unspecified fall, initial encounter: Secondary | ICD-10-CM | POA: Insufficient documentation

## 2013-10-15 DIAGNOSIS — T148XXA Other injury of unspecified body region, initial encounter: Secondary | ICD-10-CM | POA: Insufficient documentation

## 2013-10-15 DIAGNOSIS — L989 Disorder of the skin and subcutaneous tissue, unspecified: Secondary | ICD-10-CM | POA: Insufficient documentation

## 2013-10-15 DIAGNOSIS — IMO0002 Reserved for concepts with insufficient information to code with codable children: Secondary | ICD-10-CM

## 2013-10-15 NOTE — Progress Notes (Signed)
Pre visit review using our clinic review tool, if applicable. No additional management support is needed unless otherwise documented below in the visit note. 

## 2013-10-15 NOTE — Assessment & Plan Note (Signed)
I enc derm eval of this (in area of prior seb cyst)- pt will make an appt with her dermatologist Dr Terri PiedraLupton  inst to keep clean and watch for signs of infection

## 2013-10-15 NOTE — Progress Notes (Signed)
Subjective:    Patient ID: [name], female    DOB: [birth year], 80 y.o.   MRN: [id number]  HPI Had a fall on Friday  She was getting in her car and she turned her L ankle and fell down into gravel -and could not get help  Just not strong enough to get her feet under her  Several folks were not strong enough to pick her up  A neighbor came by -  And was able to push her into the car   She did not have her walker with her - in the back seat (she was just getting out of car to throw out garbage so she was not using it   She is sore all over and had abrasion on her back  Her friend cleaned it up - with alcohol  She just rested that afternoon   Also c/o inflamed area where her old seb cyst was -after she got someone to try to drain it  Wants eval of that  It is sore and occ bleeds    Patient Active Problem List   Diagnosis Date Noted  . Abrasion 10/15/2013  . Skin lesion of back 10/15/2013  . Inflamed sebaceous cyst 04/11/2013  . Vertigo, benign positional 03/15/2012  . Sebaceous cyst of right axilla 03/15/2012  . Mechanical complication due to cardiac pacemaker (electrode) 03/10/2012  . Pacemaker-St.Jude 03/03/2012  . COPD (chronic obstructive pulmonary disease) 01/28/2012  . Chronic low back pain 01/28/2012  . Chronic systolic heart failure 10/20/2011  . Bronchitis 05/07/2011  . Hypoxia 04/23/2011  . AAA 09/09/2009  . LBBB 06/05/2008  . Ventricular fibrillation 06/05/2008  . HYPERLIPIDEMIA 01/03/2007  . GLAUCOMA 01/03/2007  . MYOCARDIAL INFARCTION, HX OF 01/03/2007  . CARDIOMYOPATHY, ISCHEMIC 01/03/2007  . Atrial fibrillation 01/03/2007  . OSTEOARTHRITIS 01/03/2007  . UTERINE CANCER, HX OF 01/03/2007   Past Medical History  Diagnosis Date  . Atrial fibrillation   . Hyperlipidemia   . Myocardial infarction   . Hyperlipidemia   . Osteoarthritis   . ICD (implantable cardiac defibrillator) in place     St. Jude  . LBBB (left bundle branch block)   .  Ventricular tachycardia   . Cardiomyopathy   . Glaucoma   . History of uterine cancer   . History of total hysterectomy   . Coronary artery disease     hx of MI  . Hypertension   . Hypoxia 12/12    suspect copd but pt refuses any pulmonary work up or treatment   Past Surgical History  Procedure Laterality Date  . Coronary artery bypass graft  1995  . Cataract extraction    . Cholecystectomy    . Coronary angioplasty    . Hernia repair    . Coronary angioplasty with stent placement    . Cardiac defibrillator placement    . Cardiac defibrillator placement    . Abdominal hysterectomy  1989    total  . Doppler echocardiography  2008   History  Substance Use Topics  . Smoking status: Former Smoker    Quit date: 10/20/1982  . Smokeless tobacco: Not on file  . Alcohol Use: No   Family History  Problem Relation Age of Onset  . Heart attack Father   . Heart disease Father     MI  . Diverticulitis Mother   . Ovarian cancer Sister   . Cancer Sister     ovarian CA   No Known Allergies Current Outpatient Prescriptions on File  Prior to Visit  Medication Sig Dispense Refill  . albuterol (PROVENTIL HFA;VENTOLIN HFA) 108 (90 BASE) MCG/ACT inhaler Inhale 2 puffs into the lungs every 4 (four) hours as needed for wheezing or shortness of breath.  1 Inhaler  5  . amiodarone (PACERONE) 200 MG tablet Take 1/2 tablet by mouth daily.      Marland Kitchen. aspirin EC 81 MG tablet Take 81 mg by mouth daily.      . carvedilol (COREG) 6.25 MG tablet Take 1 tablet (6.25 mg total) by mouth 2 (two) times daily.  180 tablet  3  . clopidogrel (PLAVIX) 75 MG tablet Take 1 tablet (75 mg total) by mouth daily.  90 tablet  3  . furosemide (LASIX) 40 MG tablet TAKE ONE TABLET DAILY (Pt states she only takes this when she is going to be at home all day-09/05/13)      . latanoprost (XALATAN) 0.005 % ophthalmic solution Place 1 drop into the left eye at bedtime.       Marland Kitchen. levothyroxine (SYNTHROID, LEVOTHROID) 50 MCG tablet  Take 50 mcg by mouth 3 (three) times a week. Pt states she only takes this every once in a while (09/05/13)      . lisinopril (PRINIVIL,ZESTRIL) 10 MG tablet TAKE 1 TABLET EVERY DAY  90 tablet  0  . meclizine (ANTIVERT) 25 MG tablet Take 1 tablet (25 mg total) by mouth 3 (three) times daily as needed for dizziness (watch out for sedation ).  30 tablet  0  . rosuvastatin (CRESTOR) 10 MG tablet Take 1 tablet (10 mg total) by mouth at bedtime.  90 tablet  0   No current facility-administered medications on file prior to visit.      Review of Systems Review of Systems  Constitutional: Negative for fever, appetite change, fatigue and unexpected weight change.  Eyes: Negative for pain and visual disturbance.  Respiratory: Negative for cough and shortness of breath.   Cardiovascular: Negative for cp or palpitations    Gastrointestinal: Negative for nausea, diarrhea and constipation.  Genitourinary: Negative for urgency and frequency.  Skin: Negative for pallor or rash  pos for abrasion on her back  MSK  Pos for general muscle soreness from her fall but neg for severe pain or joint swelling  Neurological: Negative for weakness, light-headedness, numbness and headaches.  Hematological: Negative for adenopathy. Does not bruise/bleed easily.  Psychiatric/Behavioral: Negative for dysphoric mood. The patient is not nervous/anxious.          Objective:   Physical Exam  Constitutional: She appears well-developed and well-nourished. No distress.  obese and well appearing   HENT:  Head: Normocephalic and atraumatic.  Mouth/Throat: Oropharynx is clear and moist.  Eyes: Conjunctivae and EOM are normal. Pupils are equal, round, and reactive to light. Right eye exhibits no discharge. Left eye exhibits no discharge. No scleral icterus.  Neck: Normal range of motion. Neck supple.  Cardiovascular: Normal rate and regular rhythm.   Pulmonary/Chest: Effort normal and breath sounds normal. No respiratory  distress. She has no wheezes. She has no rales.  Abdominal: Soft. Bowel sounds are normal.  Musculoskeletal: Normal range of motion. She exhibits edema. She exhibits no tenderness.  No bony tenderness of spine or joints   Baseline pedal edema   Lymphadenopathy:    She has no cervical adenopathy.  Neurological: She is alert. She has normal reflexes. No cranial nerve deficit. She exhibits normal muscle tone. Coordination normal.  Steady gait with walker   Skin: Skin is  warm and dry.  Linear abrasion on back on R- healing and not infected appearing  There is an old ecchymosis (3-4 cm) under that   On L back -area of prev seb cyst has a mass resembling a granuloma  No drainage or tenderness  Psychiatric: She has a normal mood and affect.          Assessment & Plan:

## 2013-10-15 NOTE — Assessment & Plan Note (Signed)
On R back from recent fall Healing with no signs of infection  inst to keep clean/ continue abx oint  Td updated

## 2013-10-15 NOTE — Patient Instructions (Signed)
Tetanus shot today  You have a skin abrasion on your back with a bruise - you can shower with soap and water to keep it clean  Make an appt with Dr Terri PiedraLupton to look at the lesion on your back -keep that clean as well  If symptoms worsen-please let me know  Please use a walker at all times     Fall Prevention and Home Safety Falls cause injuries and can affect all age groups. It is possible to use preventive measures to significantly decrease the likelihood of falls. There are many simple measures which can make your home safer and prevent falls. OUTDOORS  Repair cracks and edges of walkways and driveways.  Remove high doorway thresholds.  Trim shrubbery on the main path into your home.  Have good outside lighting.  Clear walkways of tools, rocks, debris, and clutter.  Check that handrails are not broken and are securely fastened. Both sides of steps should have handrails.  Have leaves, snow, and ice cleared regularly.  Use sand or salt on walkways during winter months.  In the garage, clean up grease or oil spills. BATHROOM  Install night lights.  Install grab bars by the toilet and in the tub and shower.  Use non-skid mats or decals in the tub or shower.  Place a plastic non-slip stool in the shower to sit on, if needed.  Keep floors dry and clean up all water on the floor immediately.  Remove soap buildup in the tub or shower on a regular basis.  Secure bath mats with non-slip, double-sided rug tape.  Remove throw rugs and tripping hazards from the floors. BEDROOMS  Install night lights.  Make sure a bedside light is easy to reach.  Do not use oversized bedding.  Keep a telephone by your bedside.  Have a firm chair with side arms to use for getting dressed.  Remove throw rugs and tripping hazards from the floor. KITCHEN  Keep handles on pots and pans turned toward the center of the stove. Use back burners when possible.  Clean up spills quickly and allow  time for drying.  Avoid walking on wet floors.  Avoid hot utensils and knives.  Position shelves so they are not too high or low.  Place commonly used objects within easy reach.  If necessary, use a sturdy step stool with a grab bar when reaching.  Keep electrical cables out of the way.  Do not use floor polish or wax that makes floors slippery. If you must use wax, use non-skid floor wax.  Remove throw rugs and tripping hazards from the floor. STAIRWAYS  Never leave objects on stairs.  Place handrails on both sides of stairways and use them. Fix any loose handrails. Make sure handrails on both sides of the stairways are as long as the stairs.  Check carpeting to make sure it is firmly attached along stairs. Make repairs to worn or loose carpet promptly.  Avoid placing throw rugs at the top or bottom of stairways, or properly secure the rug with carpet tape to prevent slippage. Get rid of throw rugs, if possible.  Have an electrician put in a light switch at the top and bottom of the stairs. OTHER FALL PREVENTION TIPS  Wear low-heel or rubber-soled shoes that are supportive and fit well. Wear closed toe shoes.  When using a stepladder, make sure it is fully opened and both spreaders are firmly locked. Do not climb a closed stepladder.  Add color or contrast paint  or tape to grab bars and handrails in your home. Place contrasting color strips on first and last steps.  Learn and use mobility aids as needed. Install an electrical emergency response system.  Turn on lights to avoid dark areas. Replace light bulbs that burn out immediately. Get light switches that glow.  Arrange furniture to create clear pathways. Keep furniture in the same place.  Firmly attach carpet with non-skid or double-sided tape.  Eliminate uneven floor surfaces.  Select a carpet pattern that does not visually hide the edge of steps.  Be aware of all pets. OTHER HOME SAFETY TIPS  Set the water  temperature for 120 F (48.8 C).  Keep emergency numbers on or near the telephone.  Keep smoke detectors on every level of the home and near sleeping areas. Document Released: 05/07/2002 Document Revised: 11/16/2011 Document Reviewed: 08/06/2011 [facility] Patient Information 2014 [place], [place].

## 2013-10-15 NOTE — Assessment & Plan Note (Addendum)
Without serious injury-occuring when 80 yo obese female tried to get in and out of car w/o her walker  Pt states she will never do that again  Abrasion and contusion treated  Rest of exam is reassuring Update if not starting to improve in a week or if worsening   Counseled on fall prevention and handout given about this

## 2013-11-01 ENCOUNTER — Telehealth: Payer: Self-pay | Admitting: Internal Medicine

## 2013-11-01 DIAGNOSIS — I4891 Unspecified atrial fibrillation: Secondary | ICD-10-CM

## 2013-11-01 DIAGNOSIS — I5022 Chronic systolic (congestive) heart failure: Secondary | ICD-10-CM

## 2013-11-01 MED ORDER — AMIODARONE HCL 200 MG PO TABS: 100.0000 mg | ORAL_TABLET | Freq: Every day | ORAL | Status: DC

## 2013-11-01 MED ORDER — LEVOTHYROXINE SODIUM 50 MCG PO TABS: 50.0000 ug | ORAL_TABLET | ORAL | Status: DC

## 2013-11-01 MED ORDER — LISINOPRIL 10 MG PO TABS
10.0000 mg | ORAL_TABLET | Freq: Every day | ORAL | Status: DC
Start: 2013-11-01 — End: 2014-12-20

## 2013-11-01 MED ORDER — FUROSEMIDE 40 MG PO TABS: 40.0000 mg | ORAL_TABLET | Freq: Every day | ORAL | Status: DC

## 2013-11-01 MED ORDER — CLOPIDOGREL BISULFATE 75 MG PO TABS: 75.0000 mg | ORAL_TABLET | Freq: Every day | ORAL | Status: DC

## 2013-11-01 MED ORDER — CARVEDILOL 6.25 MG PO TABS: 6.2500 mg | ORAL_TABLET | Freq: Two times a day (BID) | ORAL | Status: DC

## 2013-11-01 MED ORDER — ROSUVASTATIN CALCIUM 10 MG PO TABS: 10.0000 mg | ORAL_TABLET | Freq: Every day | ORAL | Status: DC

## 2013-11-01 NOTE — Telephone Encounter (Signed)
New message    Refill on all  medication.    right sources

## 2014-03-12 ENCOUNTER — Ambulatory Visit (INDEPENDENT_AMBULATORY_CARE_PROVIDER_SITE_OTHER): Payer: 59

## 2014-03-12 DIAGNOSIS — Z23 Encounter for immunization: Secondary | ICD-10-CM

## 2014-03-14 ENCOUNTER — Ambulatory Visit (INDEPENDENT_AMBULATORY_CARE_PROVIDER_SITE_OTHER): Payer: 59 | Admitting: *Deleted

## 2014-03-14 DIAGNOSIS — I480 Paroxysmal atrial fibrillation: Secondary | ICD-10-CM

## 2014-03-14 DIAGNOSIS — I4901 Ventricular fibrillation: Secondary | ICD-10-CM

## 2014-03-14 DIAGNOSIS — I5022 Chronic systolic (congestive) heart failure: Secondary | ICD-10-CM

## 2014-03-14 LAB — MDC_IDC_ENUM_SESS_TYPE_INCLINIC
Battery Remaining Longevity: 30 mo
Battery Voltage: 2.89 V
Brady Statistic RA Percent Paced: 99 %
Brady Statistic RV Percent Paced: 99.51 %
Date Time Interrogation Session: 20151015183922
Implantable Pulse Generator Model: 3210
Lead Channel Impedance Value: 462.5 Ohm
Lead Channel Pacing Threshold Amplitude: 0.5 V
Lead Channel Pacing Threshold Amplitude: 1 V
Lead Channel Pacing Threshold Amplitude: 1 V
Lead Channel Pacing Threshold Pulse Width: 0.4 ms
Lead Channel Pacing Threshold Pulse Width: 0.4 ms
Lead Channel Pacing Threshold Pulse Width: 0.5 ms
Lead Channel Pacing Threshold Pulse Width: 0.5 ms
Lead Channel Pacing Threshold Pulse Width: 0.8 ms
Lead Channel Setting Pacing Amplitude: 2 V
Lead Channel Setting Pacing Amplitude: 2.5 V
Lead Channel Setting Pacing Amplitude: 2.75 V
Lead Channel Setting Pacing Pulse Width: 0.8 ms
Lead Channel Setting Sensing Sensitivity: 6 mV
MDC IDC MSMT LEADCHNL LV IMPEDANCE VALUE: 550 Ohm
MDC IDC MSMT LEADCHNL LV PACING THRESHOLD AMPLITUDE: 1.75 V
MDC IDC MSMT LEADCHNL LV PACING THRESHOLD AMPLITUDE: 1.75 V
MDC IDC MSMT LEADCHNL LV PACING THRESHOLD PULSEWIDTH: 0.8 ms
MDC IDC MSMT LEADCHNL RA IMPEDANCE VALUE: 350 Ohm
MDC IDC MSMT LEADCHNL RA PACING THRESHOLD AMPLITUDE: 0.5 V
MDC IDC MSMT LEADCHNL RA SENSING INTR AMPL: 1.1 mV
MDC IDC PG SERIAL: 2761365
MDC IDC SET LEADCHNL RV PACING PULSEWIDTH: 0.4 ms

## 2014-03-14 NOTE — Progress Notes (Signed)
CRT-P device check in clinic. Normal device function. Thresholds, sensing, impedance consistent with previous measurements. Histograms appropriate for patient and level of activity. 368  mode switches, longest episode >1 minute, + plavix.  Most of the episodes were chronic noise.  Total burden 1%.  No ventricular high rate episodes. Patient bi-ventricularly pacing >100 % of the time. Device programmed with appropriate safety margins. Device heart failure diagnostics are within normal limits and stable over time. Estimated longevity 2.5 years. ROV 6 months with Dr. Ladona Ridgelaylor.

## 2014-04-10 ENCOUNTER — Encounter: Payer: Self-pay | Admitting: Internal Medicine

## 2014-05-09 ENCOUNTER — Encounter (HOSPITAL_COMMUNITY): Payer: Self-pay | Admitting: Internal Medicine

## 2014-07-18 ENCOUNTER — Other Ambulatory Visit: Payer: Self-pay | Admitting: Internal Medicine

## 2014-09-10 ENCOUNTER — Encounter: Payer: Medicare PPO | Admitting: Internal Medicine

## 2014-10-11 ENCOUNTER — Other Ambulatory Visit: Payer: Self-pay | Admitting: Internal Medicine

## 2014-10-16 ENCOUNTER — Ambulatory Visit (INDEPENDENT_AMBULATORY_CARE_PROVIDER_SITE_OTHER): Payer: Medicare PPO | Admitting: Internal Medicine

## 2014-10-16 ENCOUNTER — Encounter: Payer: Self-pay | Admitting: Internal Medicine

## 2014-10-16 DIAGNOSIS — I5022 Chronic systolic (congestive) heart failure: Secondary | ICD-10-CM

## 2014-10-16 DIAGNOSIS — I4901 Ventricular fibrillation: Secondary | ICD-10-CM | POA: Diagnosis not present

## 2014-10-16 DIAGNOSIS — I480 Paroxysmal atrial fibrillation: Secondary | ICD-10-CM | POA: Diagnosis not present

## 2014-10-16 LAB — CUP PACEART INCLINIC DEVICE CHECK
Date Time Interrogation Session: 20160518155348
Lead Channel Impedance Value: 575 Ohm
Lead Channel Pacing Threshold Amplitude: 1 V
Lead Channel Pacing Threshold Amplitude: 1.75 V
Lead Channel Pacing Threshold Pulse Width: 0.4 ms
Lead Channel Pacing Threshold Pulse Width: 0.8 ms
Lead Channel Setting Pacing Amplitude: 2 V
Lead Channel Setting Pacing Amplitude: 2.5 V
Lead Channel Setting Pacing Pulse Width: 0.4 ms
Lead Channel Setting Pacing Pulse Width: 0.8 ms
MDC IDC MSMT BATTERY REMAINING LONGEVITY: 24 mo
MDC IDC MSMT BATTERY VOLTAGE: 2.86 V
MDC IDC MSMT LEADCHNL RA IMPEDANCE VALUE: 350 Ohm
MDC IDC MSMT LEADCHNL RA PACING THRESHOLD AMPLITUDE: 0.5 V
MDC IDC MSMT LEADCHNL RA PACING THRESHOLD PULSEWIDTH: 0.5 ms
MDC IDC MSMT LEADCHNL RA SENSING INTR AMPL: 1 mV
MDC IDC MSMT LEADCHNL RV IMPEDANCE VALUE: 462.5 Ohm
MDC IDC SET LEADCHNL LV PACING AMPLITUDE: 2.75 V
MDC IDC SET LEADCHNL RV SENSING SENSITIVITY: 6 mV
MDC IDC STAT BRADY RA PERCENT PACED: 98 %
MDC IDC STAT BRADY RV PERCENT PACED: 99.22 %
Pulse Gen Model: 3210
Pulse Gen Serial Number: 2761365

## 2014-10-16 NOTE — Progress Notes (Signed)
HPI [name] returns today for followup. She is a very pleasant 81 year old woman with a history of ventricular tachycardia, chronic systolic heart failure, atrial fibrillation, and complete heart block, status post permanent Biventricular pacemaker insertion. She is been stable in the interim except that she has developed worsening lower back pain. She appears to have compression fractures in her lower back and has visibly lost height over the years. She admits to sodium indiscretion. She does not like to take her diuretic. She has chronic peripheral edema. She has managed to stay out of the hospital since her last visit. No Known Allergies   Current Outpatient Prescriptions  Medication Sig Dispense Refill  . amiodarone (PACERONE) 200 MG tablet Take 0.5 tablets (100 mg total) by mouth daily. 90 tablet 3  . aspirin EC 81 MG tablet Take 81 mg by mouth daily.    . carvedilol (COREG) 6.25 MG tablet TAKE 1 TABLET TWICE DAILY 180 tablet 0  . clopidogrel (PLAVIX) 75 MG tablet TAKE 1 TABLET EVERY DAY 90 tablet 0  . furosemide (LASIX) 40 MG tablet Take 1 tablet (40 mg total) by mouth daily. 90 tablet 3  . latanoprost (XALATAN) 0.005 % ophthalmic solution Place 1 drop into the left eye at bedtime.     Marland Kitchen. levothyroxine (SYNTHROID, LEVOTHROID) 50 MCG tablet Take 50 mcg by mouth. Patient states that she takes this ocassionally    . lisinopril (PRINIVIL,ZESTRIL) 10 MG tablet Take 1 tablet (10 mg total) by mouth daily. 90 tablet 3  . rosuvastatin (CRESTOR) 10 MG tablet Take 1 tablet (10 mg total) by mouth at bedtime. 90 tablet 3   No current facility-administered medications for this visit.     Past Medical History  Diagnosis Date  . Atrial fibrillation   . Hyperlipidemia   . Myocardial infarction   . Hyperlipidemia   . Osteoarthritis   . ICD (implantable cardiac defibrillator) in place     St. Jude  . LBBB (left bundle branch block)   . Ventricular tachycardia   . Cardiomyopathy   . Glaucoma    . History of uterine cancer   . History of total hysterectomy   . Coronary artery disease     hx of MI  . Hypertension   . Hypoxia 12/12    suspect copd but pt refuses any pulmonary work up or treatment    ROS:   All systems reviewed and negative except as noted in the HPI.   Past Surgical History  Procedure Laterality Date  . Coronary artery bypass graft  1995  . Cataract extraction    . Cholecystectomy    . Coronary angioplasty    . Hernia repair    . Coronary angioplasty with stent placement    . Cardiac defibrillator placement    . Cardiac defibrillator placement    . Abdominal hysterectomy  1989    total  . Doppler echocardiography  2008  . Bi-ventricular pacemaker insertion N/A 07/23/2011    Procedure: BI-VENTRICULAR PACEMAKER INSERTION (CRT-P);  Surgeon: Marinus MawGregg W Genavie Boettger, MD;  [facility];  Service: Cardiovascular;  Laterality: N/A;     Family History  Problem Relation Age of Onset  . Heart attack Father   . Heart disease Father     MI  . Diverticulitis Mother   . Ovarian cancer Sister   . Cancer Sister     ovarian CA     History   Social History  . Marital Status: Widowed    Spouse Name: N/A  .  Number of Children: 1  . Years of Education: N/A   Occupational History  . Not on file.   Social History Main Topics  . Smoking status: Former Smoker    Quit date: 10/20/1982  . Smokeless tobacco: Not on file  . Alcohol Use: No  . Drug Use: No  . Sexual Activity: Not on file   Other Topics Concern  . Not on file   Social History Narrative     BP 138/80 mmHg  Pulse 74  Ht 5\' 3"  (1.6 m)  Wt 232 lb (105.235 kg)  BMI 41.11 kg/m2  Physical Exam:  Obese but Well appearing elderly woman,NAD HEENT: Unremarkable Neck:  7 cm JVD, no thyromegally Lungs:  Clear except for scattered basilar rales HEART:  Regular rate rhythm, no murmurs, no rubs, no clicks Abd:  soft, positive bowel sounds, no organomegally, no rebound, no guarding Ext:  2  plus pulses, 1+ peripheral edema, no cyanosis, no clubbing Skin:  No rashes no nodules Neuro:  CN II through XII intact, motor grossly intact  ECG - AV paced  DEVICE  Normal device function.  See PaceArt for details.   Assess/Plan:

## 2014-10-16 NOTE — Assessment & Plan Note (Signed)
Her St. Jude DDD PM is working normally except for some noise on her atrial lead.

## 2014-10-16 NOTE — Assessment & Plan Note (Signed)
Her symptoms are class 2. She will continue her current meds. I have asked the patient to reduce her sodium intake and to lose weight.

## 2014-10-16 NOTE — Assessment & Plan Note (Signed)
She is maintaining NSR 98% of the time. She will continue her current meds.  

## 2014-10-16 NOTE — Patient Instructions (Signed)
Medication Instructions:  Your physician recommends that you continue on your current medications as directed. Please refer to the Current Medication list given to you today.   Labwork: None   Testing/Procedures: none  Follow-Up:  Your physician wants you to follow-up in:  6 months in device clinic and 12 months with Dr. Ladona Ridgelaylor. You will receive a reminder letter in the mail two months in advance. If you don't receive a letter, please call our office to schedule the follow-up appointment.

## 2014-12-20 ENCOUNTER — Other Ambulatory Visit: Payer: Self-pay | Admitting: Internal Medicine

## 2015-03-25 ENCOUNTER — Telehealth: Payer: Self-pay | Admitting: *Deleted

## 2015-03-25 NOTE — Telephone Encounter (Signed)
Pt has copay of $80 dollars, she wanted to know why, i advised her to call her insurance company and ask what her policy was on medication and to find out which medication generated the copay, once she had that information to call back and we could possible see if there is an asst program she could use, pt expressed understanding..[name]

## 2015-03-28 ENCOUNTER — Ambulatory Visit (INDEPENDENT_AMBULATORY_CARE_PROVIDER_SITE_OTHER): Payer: Medicare PPO

## 2015-03-28 DIAGNOSIS — Z23 Encounter for immunization: Secondary | ICD-10-CM | POA: Diagnosis not present

## 2015-04-16 ENCOUNTER — Encounter: Payer: Self-pay | Admitting: Internal Medicine

## 2015-04-16 ENCOUNTER — Ambulatory Visit (INDEPENDENT_AMBULATORY_CARE_PROVIDER_SITE_OTHER): Payer: Medicare PPO | Admitting: *Deleted

## 2015-04-16 DIAGNOSIS — I480 Paroxysmal atrial fibrillation: Secondary | ICD-10-CM | POA: Diagnosis not present

## 2015-04-16 DIAGNOSIS — I5022 Chronic systolic (congestive) heart failure: Secondary | ICD-10-CM | POA: Diagnosis not present

## 2015-04-23 LAB — CUP PACEART INCLINIC DEVICE CHECK
Battery Remaining Longevity: 18
Battery Voltage: 2.83 V
Date Time Interrogation Session: 20161116171724
Implantable Lead Implant Date: 20080331
Implantable Lead Implant Date: 20080331
Implantable Lead Location: 753858
Implantable Lead Location: 753859
Implantable Lead Location: 753860
Implantable Lead Model: 1158
Implantable Lead Model: 7121
Lead Channel Impedance Value: 600 Ohm
Lead Channel Pacing Threshold Amplitude: 0.75 V
Lead Channel Pacing Threshold Amplitude: 0.75 V
Lead Channel Pacing Threshold Amplitude: 1.75 V
Lead Channel Pacing Threshold Pulse Width: 0.4 ms
Lead Channel Sensing Intrinsic Amplitude: 12 mV
Lead Channel Sensing Intrinsic Amplitude: 2.9 mV
Lead Channel Setting Pacing Amplitude: 2 V
Lead Channel Setting Pacing Amplitude: 2.5 V
Lead Channel Setting Pacing Amplitude: 2.75 V
Lead Channel Setting Pacing Pulse Width: 0.8 ms
Lead Channel Setting Sensing Sensitivity: 6 mV
MDC IDC LEAD IMPLANT DT: 20080331
MDC IDC MSMT LEADCHNL LV PACING THRESHOLD AMPLITUDE: 1.75 V
MDC IDC MSMT LEADCHNL LV PACING THRESHOLD PULSEWIDTH: 0.8 ms
MDC IDC MSMT LEADCHNL LV PACING THRESHOLD PULSEWIDTH: 0.8 ms
MDC IDC MSMT LEADCHNL RA IMPEDANCE VALUE: 350 Ohm
MDC IDC MSMT LEADCHNL RV IMPEDANCE VALUE: 475 Ohm
MDC IDC MSMT LEADCHNL RV PACING THRESHOLD PULSEWIDTH: 0.4 ms
MDC IDC SET LEADCHNL RV PACING PULSEWIDTH: 0.4 ms
MDC IDC STAT BRADY RA PERCENT PACED: 88 %
MDC IDC STAT BRADY RV PERCENT PACED: 99.27 %
Pulse Gen Model: 3210
Pulse Gen Serial Number: 2761365

## 2015-04-23 NOTE — Progress Notes (Signed)
CRT-P device check in clinic. Normal device function. Thresholds, sensing, impedance consistent with previous measurements. Histograms appropriate for patient and level of activity. Persistent AF since 10/27---patient denies any acute problems w/ ShOB or fatigue. No ventricular high rate episodes. Patient bi-ventricularly pacing >99% of the time. Device programmed with appropriate safety margins. Estimated longevity 1.5-1.8 years. Patient will follow up w/GT in 6 months.

## 2015-06-13 ENCOUNTER — Other Ambulatory Visit: Payer: Self-pay | Admitting: *Deleted

## 2015-06-13 DIAGNOSIS — I5022 Chronic systolic (congestive) heart failure: Secondary | ICD-10-CM

## 2015-06-13 MED ORDER — FUROSEMIDE 40 MG PO TABS: 40.0000 mg | ORAL_TABLET | Freq: Every day | ORAL | Status: DC

## 2015-07-14 ENCOUNTER — Other Ambulatory Visit: Payer: Self-pay | Admitting: *Deleted

## 2015-07-14 DIAGNOSIS — I5022 Chronic systolic (congestive) heart failure: Secondary | ICD-10-CM

## 2015-07-14 MED ORDER — FUROSEMIDE 40 MG PO TABS: 40.0000 mg | ORAL_TABLET | Freq: Every day | ORAL | Status: DC

## 2015-07-23 ENCOUNTER — Telehealth: Payer: Self-pay | Admitting: Internal Medicine

## 2015-07-23 NOTE — Telephone Encounter (Signed)
Received call directly from operator for patient c/o SOB.  Patient states she has had worsening SOB x 6 months.  Denies chest pain.  States she has bilateral lower extremity swelling which she believes may be related to arthritis.  She speaks in long complete sentences; it is difficult for me to ask her questions due to her lengthy discussion of various topics.  States she last took furosemide 40 mg yesterday.  States she was instructed by Dr. Ladona Ridgel at last office visit to take furosemide at least 4 times per week.  Denies improvement in leg swelling or SOB with furosemide.  She c/o decreased appetite over the last few months, she believes it is related to amiodarone.  It appears she has been taking amiodarone since 2014. States she has multiple complaints due to age but was encouraged to call Dr. Ladona Ridgel to make an appointment by her family.  I scheduled her to see Dr. Ladona Ridgel on Monday 2/27.  I advised her to call back if she has worsening SOB or chest pain.  Patient states she will call 911 if she has chest pain.  She verbalized understanding and agreement with plan.  She thanked me for my time.

## 2015-07-23 NOTE — Telephone Encounter (Signed)
Pt c/o Shortness Of Breath: STAT if SOB developed within the last 24 hours or pt is noticeably SOB on the phone  1. Are you currently SOB (can you hear that pt is SOB on the phone)? yes  2. How long have you been experiencing SOB? Could not say  3. Are you SOB when sitting or when up moving around? Yes   4. Are you currently experiencing any other symptoms? no

## 2015-07-28 ENCOUNTER — Encounter: Payer: Self-pay | Admitting: Internal Medicine

## 2015-07-28 ENCOUNTER — Ambulatory Visit (INDEPENDENT_AMBULATORY_CARE_PROVIDER_SITE_OTHER): Payer: Medicare Other | Admitting: Internal Medicine

## 2015-07-28 VITALS — BP 102/72 | HR 74 | Ht 63.0 in | Wt 223.6 lb

## 2015-07-28 DIAGNOSIS — I5022 Chronic systolic (congestive) heart failure: Secondary | ICD-10-CM

## 2015-07-28 DIAGNOSIS — I480 Paroxysmal atrial fibrillation: Secondary | ICD-10-CM

## 2015-07-28 LAB — BASIC METABOLIC PANEL
BUN: 24 mg/dL (ref 7–25)
CHLORIDE: 99 mmol/L (ref 98–110)
CO2: 33 mmol/L — ABNORMAL HIGH (ref 20–31)
Calcium: 9.1 mg/dL (ref 8.6–10.4)
Creat: 1.48 mg/dL — ABNORMAL HIGH (ref 0.60–0.88)
Glucose, Bld: 108 mg/dL — ABNORMAL HIGH (ref 65–99)
POTASSIUM: 3.9 mmol/L (ref 3.5–5.3)
SODIUM: 143 mmol/L (ref 135–146)

## 2015-07-28 LAB — TSH: TSH: 5.34 mIU/L — ABNORMAL HIGH

## 2015-07-28 LAB — HEPATIC FUNCTION PANEL
ALT: 12 U/L (ref 6–29)
AST: 18 U/L (ref 10–35)
Albumin: 3.5 g/dL — ABNORMAL LOW (ref 3.6–5.1)
Alkaline Phosphatase: 58 U/L (ref 33–130)
BILIRUBIN DIRECT: 0.7 mg/dL — AB (ref <=0.2)
BILIRUBIN TOTAL: 2.7 mg/dL — AB (ref 0.2–1.2)
Indirect Bilirubin: 2 mg/dL — ABNORMAL HIGH (ref 0.2–1.2)
Total Protein: 6.1 g/dL (ref 6.1–8.1)

## 2015-07-28 NOTE — Progress Notes (Signed)
HPI [name] returns today for followup. She is a very pleasant 82 year old woman with a history of ventricular tachycardia, chronic systolic heart failure, atrial fibrillation, and complete heart block, status post permanent Biventricular pacemaker insertion. She is been stable in the interim except for sob with exertion. She admits to sodium indiscretion. She does not like to take her diuretic. She has chronic peripheral edema. She has managed to stay out of the hospital since her last visit. She does note that she is only taking her lasix 4 times a week.  No Known Allergies   Current Outpatient Prescriptions  Medication Sig Dispense Refill  . amiodarone (PACERONE) 200 MG tablet TAKE 1/2 TABLET EVERY DAY 45 tablet 3  . aspirin EC 81 MG tablet Take 81 mg by mouth daily.    . carvedilol (COREG) 6.25 MG tablet TAKE 1 TABLET TWICE DAILY 180 tablet 3  . clopidogrel (PLAVIX) 75 MG tablet TAKE 1 TABLET EVERY DAY 90 tablet 3  . furosemide (LASIX) 40 MG tablet Take 1 tablet (40 mg total) by mouth daily. 90 tablet 0  . latanoprost (XALATAN) 0.005 % ophthalmic solution Place 1 drop into the left eye at bedtime.     Marland Kitchen levothyroxine (SYNTHROID, LEVOTHROID) 50 MCG tablet TAKE 1 TABLET THREE TIMES WEEKLY 39 tablet 3  . lisinopril (PRINIVIL,ZESTRIL) 10 MG tablet TAKE 1 TABLET EVERY DAY 90 tablet 3  . rosuvastatin (CRESTOR) 10 MG tablet TAKE 1 TABLET AT BEDTIME 90 tablet 3   No current facility-administered medications for this visit.     Past Medical History  Diagnosis Date  . Atrial fibrillation (HCC)   . Hyperlipidemia   . Myocardial infarction (HCC)   . Hyperlipidemia   . Osteoarthritis   . ICD (implantable cardiac defibrillator) in place     St. Jude  . LBBB (left bundle branch block)   . Ventricular tachycardia (HCC)   . Cardiomyopathy   . Glaucoma   . History of uterine cancer   . History of total hysterectomy   . Coronary artery disease     hx of MI  . Hypertension   . Hypoxia  12/12    suspect copd but pt refuses any pulmonary work up or treatment    ROS:   All systems reviewed and negative except as noted in the HPI.   Past Surgical History  Procedure Laterality Date  . Coronary artery bypass graft  1995  . Cataract extraction    . Cholecystectomy    . Coronary angioplasty    . Hernia repair    . Coronary angioplasty with stent placement    . Cardiac defibrillator placement    . Cardiac defibrillator placement    . Abdominal hysterectomy  1989    total  . Doppler echocardiography  2008  . Bi-ventricular pacemaker insertion N/A 07/23/2011    Procedure: BI-VENTRICULAR PACEMAKER INSERTION (CRT-P);  Surgeon: Marinus Maw, MD;  Location: [facility] CATH LAB;  Service: Cardiovascular;  Laterality: N/A;     Family History  Problem Relation Age of Onset  . Heart attack Father   . Heart disease Father     MI  . Diverticulitis Mother   . Ovarian cancer Sister   . Cancer Sister     ovarian CA     Social History   Social History  . Marital Status: Widowed    Spouse Name: N/A  . Number of Children: 1  . Years of Education: N/A   Occupational History  . Not on file.  Social History Main Topics  . Smoking status: Former Smoker    Quit date: 10/20/1982  . Smokeless tobacco: Not on file  . Alcohol Use: No  . Drug Use: No  . Sexual Activity: Not on file   Other Topics Concern  . Not on file   Social History Narrative     BP 102/72 mmHg  Pulse 74  Ht  (1.6 m)  Wt 223 lb 9.6 oz (101.424 kg)  BMI 39.62 kg/m2  Physical Exam:  Obese but Well appearing elderly woman,NAD HEENT: Unremarkable Neck:  7 cm JVD, no thyromegally Lungs:  Clear except for scattered basilar rales HEART:  Regular rate rhythm, no murmurs, no rubs, no clicks Abd:  soft, positive bowel sounds, no organomegally, no rebound, no guarding Ext:  2 plus pulses, 1+ peripheral edema, no cyanosis, no clubbing Skin:  No rashes no nodules Neuro:  CN II through XII intact,  motor grossly intact  ECG - Atrial fib with Ventricular pacing  DEVICE  Normal device function.  See PaceArt for details.   Assess/Plan: 1. Recurrent, persistent atrial fib - her rate is controlled. She is not symptomatic with regard to palpitations. She is not a candidate for systemic anti-coagulation. 2. Chronic systolic heart failure - her symptoms are a bit worse, possibly due to atrial fib. She admits to some non-compliance with her lasix and with sodium 3. VT - she has had no recurrent ventricular arrhythmias. She will continue low dose amiodarone. 4. PPM - her biv PM is working normally. Will recheck in several months.  Leonia Reeves.D.

## 2015-07-28 NOTE — Patient Instructions (Addendum)
Medication Instructions:  Take your fluid pil every day.  If the swelling is still there can take 2 pills for up to 3 days in a row.  Call me if you have to do this   Labwork: Your physician recommends that you return for lab work today: BMP/Liver/TSH   Testing/Procedures: None ordered   Follow-Up: Your physician wants you to follow-up in: 6 months with Dr Court Joy will receive a reminder letter in the mail two months in advance. If you don't receive a letter, please call our office to schedule the follow-up appointment.   Any Other Special Instructions Will Be Listed Below (If Applicable).     If you need a refill on your cardiac medications before your next appointment, please call your pharmacy.

## 2015-08-05 ENCOUNTER — Telehealth: Payer: Self-pay | Admitting: Family Medicine

## 2015-08-05 ENCOUNTER — Telehealth: Payer: Self-pay | Admitting: Internal Medicine

## 2015-08-05 NOTE — Telephone Encounter (Signed)
Patient Name: Linna HoffSARAH Masten DOB: 1921-11-28 Initial Comment Caller states her grandmother, she's trying to cough up mucus, has blood. More trouble breathing. Nurse Assessment Nurse: Elijah Birkaldwell, RN, Lynda Date/Time (Eastern Time): 08/05/2015 11:27:40 AM Confirm and document reason for call. If symptomatic, describe symptoms. You must click the next button to save text entered. ---Caller states her grandmother, she's trying to cough up mucus, has some blood in it. More trouble breathing. Seen at the heart Dr. last week, they increased her diuretics. Has the patient traveled out of the country within the last 30 days? ---Not Applicable Does the patient have any new or worsening symptoms? ---Yes Will a triage be completed? ---Yes Related visit to physician within the last 2 weeks? ---Yes Does the PT have any chronic conditions? (i.e. diabetes, asthma, etc.) ---Yes List chronic conditions. ---heart issues, out of rhythm, on lots of rxs. Is this a behavioral health or substance abuse call? ---No Guidelines Guideline Title Affirmed Question Affirmed Notes Breathing Difficulty [1] Longstanding difficulty breathing AND [2] not responding to usual therapy Final Disposition User See Physician within 4 Hours (or PCP triage) Elijah Birkaldwell, RN, Stark BrayLynda Comments There is no availability for patient's Dr. today to be seen. Caller is not with patient, she is at home with caller's parents. Feels her breathing has not improved since they increased her diuretic last week. Will check to see best time to bring her in & will call for an appt. In the meantime, nurse advised [facility] if necessary. Caller is concerned because she coughed up rusty colored, blood mixed in phlegm this am. Referrals GO TO FACILITY UNDECIDED REFERRED TO PCP OFFICE Disagree/Comply: Comply

## 2015-08-05 NOTE — Telephone Encounter (Signed)
She has appt with me tomorrow-I will see her then

## 2015-08-05 NOTE — Telephone Encounter (Signed)
Spoke with granddaughter and let her know the results and that Dr Ladona Ridgelaylor has not reviewed yet.  Per patient Dr Ladona Ridgelaylor adjust her Synthroid.  I let granddaughter know I would call them back on Thurs with POC

## 2015-08-05 NOTE — Telephone Encounter (Signed)
New message      Calling to get lab results.  Please call before 3pm

## 2015-08-06 ENCOUNTER — Ambulatory Visit (INDEPENDENT_AMBULATORY_CARE_PROVIDER_SITE_OTHER)
Admission: RE | Admit: 2015-08-06 | Discharge: 2015-08-06 | Disposition: A | Discharge status: Home / Self Care | Payer: Medicare Other | Source: Ambulatory Visit | Attending: Family Medicine | Admitting: Family Medicine

## 2015-08-06 ENCOUNTER — Telehealth: Payer: Self-pay | Admitting: Family Medicine

## 2015-08-06 ENCOUNTER — Ambulatory Visit (INDEPENDENT_AMBULATORY_CARE_PROVIDER_SITE_OTHER): Payer: Medicare Other | Admitting: Family Medicine

## 2015-08-06 ENCOUNTER — Encounter: Payer: Self-pay | Admitting: Family Medicine

## 2015-08-06 VITALS — BP 110/68 | HR 76 | Temp 98.2°F | Ht 63.0 in | Wt 221.8 lb

## 2015-08-06 DIAGNOSIS — R05 Cough: Secondary | ICD-10-CM

## 2015-08-06 DIAGNOSIS — R058 Other specified cough: Secondary | ICD-10-CM

## 2015-08-06 DIAGNOSIS — R042 Hemoptysis: Secondary | ICD-10-CM

## 2015-08-06 MED ORDER — AMOXICILLIN-POT CLAVULANATE 875-125 MG PO TABS: 1.0000 | ORAL_TABLET | Freq: Two times a day (BID) | ORAL | Status: DC

## 2015-08-06 NOTE — Telephone Encounter (Signed)
Left voicemail requesting pt's son to call office back 

## 2015-08-06 NOTE — Progress Notes (Signed)
Subjective:    Patient ID: [name], female    DOB: Apr 06, 1922, 82 y.o.   MRN: [id number]  HPI Here with cough   Some is productive of rusty sputum or a bit of blood   She is used to having pnd and sinus congestion every am (spits out beige material)-at least a week Unsure when the more rusty material started   No ear or throat pain   No fever or aches or chills   No energy   No appetite   Sob not much improved since inc her lasix    Saw cardiology last week- sob - and told her to take her fluid pill more regularly   Patient Active Problem List   Diagnosis Date Noted  . Productive cough 08/06/2015  . Hemoptysis 08/06/2015  . Abrasion 10/15/2013  . Skin lesion of back 10/15/2013  . Fall 10/15/2013  . Inflamed sebaceous cyst 04/11/2013  . Vertigo, benign positional 03/15/2012  . Sebaceous cyst of right axilla 03/15/2012  . Mechanical complication due to cardiac pacemaker (electrode) 03/10/2012  . Pacemaker-St.Jude 03/03/2012  . COPD (chronic obstructive pulmonary disease) (HCC) 01/28/2012  . Chronic low back pain 01/28/2012  . Chronic systolic heart failure (HCC) 10/20/2011  . Hypoxia 04/23/2011  . AAA 09/09/2009  . LBBB 06/05/2008  . Ventricular fibrillation (HCC) 06/05/2008  . HYPERLIPIDEMIA 01/03/2007  . GLAUCOMA 01/03/2007  . MYOCARDIAL INFARCTION, HX OF 01/03/2007  . CARDIOMYOPATHY, ISCHEMIC 01/03/2007  . Atrial fibrillation (HCC) 01/03/2007  . OSTEOARTHRITIS 01/03/2007  . UTERINE CANCER, HX OF 01/03/2007   Past Medical History  Diagnosis Date  . Atrial fibrillation (HCC)   . Hyperlipidemia   . Myocardial infarction (HCC)   . Hyperlipidemia   . Osteoarthritis   . ICD (implantable cardiac defibrillator) in place     St. Jude  . LBBB (left bundle branch block)   . Ventricular tachycardia (HCC)   . Cardiomyopathy   . Glaucoma   . History of uterine cancer   . History of total hysterectomy   . Coronary artery disease     hx of MI  .  Hypertension   . Hypoxia 12/12    suspect copd but pt refuses any pulmonary work up or treatment   Past Surgical History  Procedure Laterality Date  . Coronary artery bypass graft  1995  . Cataract extraction    . Cholecystectomy    . Coronary angioplasty    . Hernia repair    . Coronary angioplasty with stent placement    . Cardiac defibrillator placement    . Cardiac defibrillator placement    . Abdominal hysterectomy  1989    total  . Doppler echocardiography  2008  . Bi-ventricular pacemaker insertion N/A 07/23/2011    Procedure: BI-VENTRICULAR PACEMAKER INSERTION (CRT-P);  Surgeon: Marinus Maw, MD;  Location[facility] C[facility] LAB;  Service: Cardiovascular;  Laterality: N/A;   Social History  Substance Use Topics  . Smoking status: Former Smoker    Quit date: 10/20/1982  . Smokeless tobacco: None  . Alcohol Use: No   Family History  Problem Relation Age of Onset  . Heart attack Father   . Heart disease Father     MI  . Diverticulitis Mother   . Ovarian cancer Sister   . Cancer Sister     ovarian CA   No Known Allergies Current Outpatient Prescriptions on File Prior to Visit  Medication Sig Dispense Refill  . amiodarone (PACERONE) 200 MG tablet TAKE 1/2 TABLET  EVERY DAY 45 tablet 3  . aspirin EC 81 MG tablet Take 81 mg by mouth daily.    . carvedilol (COREG) 6.25 MG tablet TAKE 1 TABLET TWICE DAILY 180 tablet 3  . clopidogrel (PLAVIX) 75 MG tablet TAKE 1 TABLET EVERY DAY 90 tablet 3  . furosemide (LASIX) 40 MG tablet Take 1 tablet (40 mg total) by mouth daily. 90 tablet 0  . latanoprost (XALATAN) 0.005 % ophthalmic solution Place 1 drop into the left eye at bedtime.     Marland Kitchen. levothyroxine (SYNTHROID, LEVOTHROID) 50 MCG tablet TAKE 1 TABLET THREE TIMES WEEKLY 39 tablet 3  . lisinopril (PRINIVIL,ZESTRIL) 10 MG tablet TAKE 1 TABLET EVERY DAY 90 tablet 3  . rosuvastatin (CRESTOR) 10 MG tablet TAKE 1 TABLET AT BEDTIME 90 tablet 3   No current facility-administered medications on  file prior to visit.     Review of Systems Review of Systems  Constitutional: Negative for fever, appetite change,  and unexpected weight change. pos for fatigue ENT pos for cong and rhinorrhea/ neg for sinus pain ,pos for hearing loss/refuses hearing aides  Eyes: Negative for pain and visual disturbance.  Respiratory: Negative for wheeze/ pos for cough and also intermittent sob    Cardiovascular: Negative for cp or palpitations   neg for PND /pos for orthopnea  Gastrointestinal: Negative for nausea, diarrhea and constipation.  Genitourinary: Negative for urgency and frequency.  Skin: Negative for pallor or rash   Neurological: Negative for weakness, light-headedness, numbness and headaches.  Hematological: Negative for adenopathy. Does not bruise/bleed easily.  Psychiatric/Behavioral: Negative for dysphoric mood. The patient is not nervous/anxious.         Objective:   Physical Exam  Constitutional: She appears well-developed and well-nourished. No distress.  Frail appearing elderly obese female  HENT:  Head: Normocephalic and atraumatic.  Right Ear: External ear normal.  Left Ear: External ear normal.  Mouth/Throat: Oropharynx is clear and moist. No oropharyngeal exudate.  Nares are injected and congested  Throat is clear  Clear post nasal drip  No sinus tenderness   Very HOH- difficult to get a history from    Eyes: Conjunctivae and EOM are normal. Pupils are equal, round, and reactive to light. Right eye exhibits no discharge. Left eye exhibits no discharge.  Neck: Normal range of motion. Neck supple. No JVD present.  Cardiovascular: Normal rate and regular rhythm.   Pulmonary/Chest: Effort normal and breath sounds normal. No respiratory distress. She has no wheezes. She has no rales. She exhibits no tenderness.  No wheeze  Good air exch  Cough sounds hacking   No prolonged exp phase but bs are generally distant   No crakles or dullness otday  Lymphadenopathy:     She has no cervical adenopathy.  Neurological: She is alert. No cranial nerve deficit.  Skin: Skin is warm and dry. No rash noted. No pallor.  Psychiatric:  Pt is very HOH and much of the history comes from family  She is argumentative with them Tends to interrupt  A bit agitated today          Assessment & Plan:   Problem List Items Addressed This Visit      Respiratory   Hemoptysis    Recent prod cough with rusty colored sputum to slt blood  Hypoxia /mild on exertion is baseline -pt has declined tx in the past  Reassuring exam  Hx of past smoking  Suspect uri/bronchitis  cxr now and plan from there  Family here-voiced  understanding         Relevant Orders   DG Chest 2 View (Completed)     Other   Productive cough - Primary   Relevant Orders   DG Chest 2 View (Completed)

## 2015-08-06 NOTE — Patient Instructions (Signed)
I think you may have a respiratory infection  We will do a chest xray now  Your shortness of breath may come from both the heart problems and also the respiratory infection  I will get in touch with you when the xray report returns and we will make a plan

## 2015-08-06 NOTE — Progress Notes (Signed)
Pre visit review using our clinic review tool, if applicable. No additional management support is needed unless otherwise documented below in the visit note. 

## 2015-08-06 NOTE — Telephone Encounter (Signed)
DIL notified of xray results and Dr. Royden Purlower's comments. F/u appt scheduled and they will go get abx and check in with cardiology

## 2015-08-06 NOTE — Telephone Encounter (Signed)
Please let her son or DIL know that her chest xray cannot rule out a very early left lower lobe pneumonia (if so -it is small)  Because of her cough and shortness of breath I do want to cover her for infection -please call in augmentin  We will need her to follow up in 2-4 weeks for re eval and to re check the chest xray  Update me if symptoms do not improve  The plavix may be the reason for blood in her sputum Stay in touch with cardiology also regarding the shortness of breath   Update if not starting to improve in a week or if worsening  -- sob/cough/fever or other symptoms

## 2015-08-07 ENCOUNTER — Telehealth: Payer: Self-pay

## 2015-08-07 MED ORDER — AMOXICILLIN-POT CLAVULANATE 875-125 MG PO TABS: 1.0000 | ORAL_TABLET | Freq: Two times a day (BID) | ORAL | Status: DC

## 2015-08-07 NOTE — Telephone Encounter (Signed)
Please make sure they did get the px  Thanks

## 2015-08-07 NOTE — Telephone Encounter (Signed)
PLEASE NOTE: All timestamps contained within this report are represented as [place] Time. CONFIDENTIALTY NOTICE: This fax transmission is intended only for the addressee. It contains information that is legally privileged, confidential or otherwise protected from use or disclosure. If you are not the intended recipient, you are strictly prohibited from reviewing, disclosing, copying using or disseminating any of this information or taking any action in reliance on or regarding this information. If you have received this fax in error, please notify us immediately by telephone so that we can arrange for its return to us. Phone: [phone], Toll-Free: [phone], Fax: [phone] Page: 1 of 2 Call Id: [id number] [facility] - Client TELEPHONE ADVICE RECORD [facility] Patient Name: [name] Gender: Female DOB: 1921/08/17 Age: 82 Y 2 M 12 D Return Phone Number: [phone] (Primary) Address: City/State/Zip: [place] Client [facility] Client Client Site [facility] - Night Physician [place], [place] Contact Type Call Who Is Calling Patient / Member / Family / Caregiver Call Type Triage / Clinical Caller Name Purcell MoutonLauren Phillips Relationship To Patient Neighbor Return Phone Number [phone] (Primary) Chief Complaint Prescription Refill or Medication Request (non symptomatic) Reason for Call Symptomatic / Request for Health Information Initial Comment Caller stated her neighbor was supposed to have a prescription called in but [facility] has not received yet. Translation No Nurse Assessment Nurse: Tera Materowan, RN, Elnita Maxwellheryl Date/Time (Eastern Time): 08/06/2015 7:34:10 PM Confirm and document reason for call. If symptomatic, describe symptoms. You must click the next button to save text entered. ---Caller states that the pt was seen in the office today for a CXR. The office called later to say  that she appears to be developing pneumonia and that an antibiotic would [facility] but [facility] was never [facility]. [phone], [address], [place]. Has the patient traveled out of the country within the last 30 days? ---Not Applicable Does the patient have any new or worsening symptoms? ---No Please document clinical information provided and list any resource used. ---Advised that I would page the oncall to discuss and she would receive a callback. Guidelines Guideline Title Affirmed Question Affirmed Notes Nurse Date/Time (Eastern Time) Disp. Time Lamount Cohen(Eastern Time) Disposition Final User 08/06/2015 7:50:41 PM Called On-Call Provider Tera Materowan, RN, Elnita Maxwellheryl 08/06/2015 7:55:53 PM Clinical Call Yes Tera Materowan, RN, Elnita Maxwellheryl PLEASE NOTE: All timestamps contained within this report are represented as [place] Standard Time. CONFIDENTIALTY NOTICE: This fax transmission is intended only for the addressee. It contains information that is legally privileged, confidential or otherwise protected from use or disclosure. If you are not the intended recipient, you are strictly prohibited from reviewing, disclosing, copying using or disseminating any of this information or taking any action in reliance on or regarding this information. If you have received this fax in error, please notify us immediately by telephone so that we can arrange for its return to us. Phone: [phone], Toll-Free: [phone], Fax: [phone] Page: 2 of 2 Call Id: [id number] Paging DoctorName Phone DateTime Result/Outcome Message Type Notes Dale DurhamScott, Charlene [phone] 08/06/2015 7:50:41 PM Called On Call Provider - Reached Doctor Paged Dale DurhamScott, Charlene 08/06/2015 7:55:30 PM Spoke with On Call - General Message Result Spoke with Dr Lorin PicketScott and was advised of situation. Rx was sent to the pharmacy so she will contact to them to make sure that they had the order. Caller notified that rx was being taken care  of she could pick up shortly. Verbalized understanding.

## 2015-08-07 NOTE — Telephone Encounter (Signed)
Sent in and pharmacy will call family when it's ready

## 2015-08-07 NOTE — Assessment & Plan Note (Signed)
Recent prod cough with rusty colored sputum to slt blood  Hypoxia /mild on exertion is baseline -pt has declined tx in the past  Reassuring exam  Hx of past smoking  Suspect uri/bronchitis  cxr now and plan from there  Family here-voiced understanding

## 2015-08-07 NOTE — Telephone Encounter (Addendum)
Discussed with Dr Ladona Ridgelaylor and called to inform the family his recommendations.  He wants her to increase her Synthroid to daily Mon- Fri.  When speaking with her daughter in law she says she found out after her appointment that she had stopped taking her Synthroid.  So now we will restart taking like before 3 days a week and recheck her labs in 3 months.  They are aware.

## 2015-08-26 ENCOUNTER — Ambulatory Visit (INDEPENDENT_AMBULATORY_CARE_PROVIDER_SITE_OTHER): Payer: Medicare Other | Admitting: Family Medicine

## 2015-08-26 ENCOUNTER — Encounter: Payer: Self-pay | Admitting: Family Medicine

## 2015-08-26 VITALS — BP 118/62 | HR 75 | Temp 98.1°F | Ht 63.0 in | Wt 233.0 lb

## 2015-08-26 DIAGNOSIS — I5022 Chronic systolic (congestive) heart failure: Secondary | ICD-10-CM | POA: Diagnosis not present

## 2015-08-26 DIAGNOSIS — J189 Pneumonia, unspecified organism: Secondary | ICD-10-CM | POA: Insufficient documentation

## 2015-08-26 MED ORDER — FUROSEMIDE 40 MG PO TABS: 80.0000 mg | ORAL_TABLET | Freq: Every day | ORAL | Status: AC

## 2015-08-26 NOTE — Assessment & Plan Note (Signed)
Improved clinically after augmentin course No longer coughing Hypoxia is baseline and she refuses 02 or other therapy

## 2015-08-26 NOTE — Patient Instructions (Signed)
Increase lasix to 2 pills once daily  Start checking daily weight in the am and keep a log  Call the cardiology office in 3-4 days and tell them how weight and swelling and symptoms are on increased lasix  Make a lab appointment for the cardiology office in 1 week (I will order bmp)   I think the pneumonia is better   Elevate feet when you sit  Avoid sodium as much as you can

## 2015-08-26 NOTE — Assessment & Plan Note (Signed)
12 lb wt gain since last visit with pedal edema/ mild sob and inc abd girth States she is watching sodium and elevating legs  Takes lasix daily unless she has am appt-then takes it when she gets home Took 2 yesterday-voiced imp Will inc lasix to 80 mg once daily this week -check bmp at [facility] in a week  Urged family to take daily weights -keep a log and call cardiology in 3-4 d with progress /symptoms (earlier if worse)

## 2015-08-26 NOTE — Progress Notes (Signed)
Subjective:    Patient ID: [name], female    DOB: Oct 15, 1921, 82 y.o.   MRN: [id number]  HPI Here for f/u of CAP  Last visit had prod cough and hemoptysis    CLINICAL DATA: Productive cough, hemoptysis  EXAM: CHEST 2 VIEW  COMPARISON: Chest x-ray of 04/23/2011  FINDINGS: Prominent linear markings at the lung bases may represent atelectasis, but development of pneumonia particularly in the left lower lobe cannot be excluded. Followup films are recommended. Moderate cardiomegaly is stable and AICD lead remains. The bones are osteopenic.  IMPRESSION: Bibasilar linear atelectasis. Difficult to exclude developing pneumonia particularly in the left lower lobe. Consider followup chest x-ray if symptoms warrant.   tx with augmentin Better -no longer cough or hemoptysis   Is hypoxic - for a while- declines 02 tx of any kind   Has CHF- ? If worsening  Wt is up 12 lb today  87% pulse ox on RA On lasix- did not take one this am - took 2 yesterday and it helped more  Not eating more Is watching sodium   Firm over lower abdomen  Legs are swollen  Purple spots on them (bruises)  Patient Active Problem List   Diagnosis Date Noted  . CAP (community acquired pneumonia) 08/26/2015  . Productive cough 08/06/2015  . Hemoptysis 08/06/2015  . Abrasion 10/15/2013  . Skin lesion of back 10/15/2013  . Fall 10/15/2013  . Inflamed sebaceous cyst 04/11/2013  . Vertigo, benign positional 03/15/2012  . Sebaceous cyst of right axilla 03/15/2012  . Mechanical complication due to cardiac pacemaker (electrode) 03/10/2012  . Pacemaker-St.Jude 03/03/2012  . COPD (chronic obstructive pulmonary disease) (HCC) 01/28/2012  . Chronic low back pain 01/28/2012  . Chronic systolic heart failure (HCC) 10/20/2011  . Hypoxia 04/23/2011  . AAA 09/09/2009  . LBBB 06/05/2008  . Ventricular fibrillation (HCC) 06/05/2008  . HYPERLIPIDEMIA 01/03/2007  . GLAUCOMA 01/03/2007  .  MYOCARDIAL INFARCTION, HX OF 01/03/2007  . CARDIOMYOPATHY, ISCHEMIC 01/03/2007  . Atrial fibrillation (HCC) 01/03/2007  . OSTEOARTHRITIS 01/03/2007  . UTERINE CANCER, HX OF 01/03/2007   Past Medical History  Diagnosis Date  . Atrial fibrillation (HCC)   . Hyperlipidemia   . Myocardial infarction (HCC)   . Hyperlipidemia   . Osteoarthritis   . ICD (implantable cardiac defibrillator) in place     St. Jude  . LBBB (left bundle branch block)   . Ventricular tachycardia (HCC)   . Cardiomyopathy   . Glaucoma   . History of uterine cancer   . History of total hysterectomy   . Coronary artery disease     hx of MI  . Hypertension   . Hypoxia 12/12    suspect copd but pt refuses any pulmonary work up or treatment   Past Surgical History  Procedure Laterality Date  . Coronary artery bypass graft  1995  . Cataract extraction    . Cholecystectomy    . Coronary angioplasty    . Hernia repair    . Coronary angioplasty with stent placement    . Cardiac defibrillator placement    . Cardiac defibrillator placement    . Abdominal hysterectomy  1989    total  . Doppler echocardiography  2008  . Bi-ventricular pacemaker insertion N/A 07/23/2011    Procedure: BI-VENTRICULAR PACEMAKER INSERTION (CRT-P);  Surgeon: Marinus MawGregg W Taylor, MD;  [facility];  Service: Cardiovascular;  Laterality: N/A;   Social History  Substance Use Topics  . Smoking status: Former Smoker  Quit date: 10/20/1982  . Smokeless tobacco: None  . Alcohol Use: No   Family History  Problem Relation Age of Onset  . Heart attack Father   . Heart disease Father     MI  . Diverticulitis Mother   . Ovarian cancer Sister   . Cancer Sister     ovarian CA   No Known Allergies Current Outpatient Prescriptions on File Prior to Visit  Medication Sig Dispense Refill  . amiodarone (PACERONE) 200 MG tablet TAKE 1/2 TABLET EVERY DAY 45 tablet 3  . amoxicillin-clavulanate (AUGMENTIN) 875-125 MG tablet Take 1 tablet  by mouth 2 (two) times daily. Take with food 20 tablet 0  . aspirin EC 81 MG tablet Take 81 mg by mouth daily.    . carvedilol (COREG) 6.25 MG tablet TAKE 1 TABLET TWICE DAILY 180 tablet 3  . clopidogrel (PLAVIX) 75 MG tablet TAKE 1 TABLET EVERY DAY 90 tablet 3  . furosemide (LASIX) 40 MG tablet Take 1 tablet (40 mg total) by mouth daily. 90 tablet 0  . latanoprost (XALATAN) 0.005 % ophthalmic solution Place 1 drop into the left eye at bedtime.     Marland Kitchen levothyroxine (SYNTHROID, LEVOTHROID) 50 MCG tablet TAKE 1 TABLET THREE TIMES WEEKLY 39 tablet 3  . lisinopril (PRINIVIL,ZESTRIL) 10 MG tablet TAKE 1 TABLET EVERY DAY 90 tablet 3  . rosuvastatin (CRESTOR) 10 MG tablet TAKE 1 TABLET AT BEDTIME 90 tablet 3   No current facility-administered medications on file prior to visit.     Review of Systems Review of Systems  Constitutional: Negative for fever, appetite change, fatigue and unexpected weight change.  Eyes: Negative for pain and visual disturbance.  Respiratory: Negative for cough and pos for shortness of breath.  (exertional) Cardiovascular: Negative for cp or palpitations   pos for orthopnea / neg for PND  Gastrointestinal: Negative for nausea, diarrhea and constipation.  Genitourinary: Negative for urgency and frequency.  Skin: Negative for pallor or rash   Neurological: Negative for weakness, light-headedness, numbness and headaches.  Hematological: Negative for adenopathy. Does not bruise/bleed easily.  Psychiatric/Behavioral: Negative for dysphoric mood. The patient is not nervous/anxious.         Objective:   Physical Exam  Constitutional: She appears well-developed and well-nourished. No distress.  obese and well appearing   HENT:  Head: Normocephalic and atraumatic.  Mouth/Throat: Oropharynx is clear and moist.  Mild pnd  Eyes: Conjunctivae and EOM are normal. Pupils are equal, round, and reactive to light. No scleral icterus.  Neck: Normal range of motion. Neck supple.  No JVD present. Carotid bruit is not present. No thyromegaly present.  Cardiovascular: Normal rate, normal heart sounds and intact distal pulses.  Exam reveals no gallop.   Pulmonary/Chest: Effort normal and breath sounds normal. No respiratory distress. She has no wheezes. She has no rales.  diffuse crackles  1/2 way up   No rales or rhonchi or wheeze Not sob at rest   Abdominal: Soft. Bowel sounds are normal. She exhibits no distension, no abdominal bruit and no mass. There is no tenderness.  Lower 1/2 of abd is edematous and firm -no weeping or skin change Non tender   Musculoskeletal: She exhibits edema.  2 plus pedal edema  Some bruising on legs -no skin breakdown and no weeping  Lower 1/2 of abd is edematous also (no ascites)  Lymphadenopathy:    She has no cervical adenopathy.  Neurological: She is alert. She has normal reflexes. Coordination normal.  Skin: Skin  is warm and dry. No rash noted.  Psychiatric: She has a normal mood and affect. Her speech is tangential.  Tangential  Difficult to get history at times Less irritable today however           Assessment & Plan:   Problem List Items Addressed This Visit      Cardiovascular and Mediastinum   Chronic systolic heart failure (HCC) - Primary    12 lb wt gain since last visit with pedal edema/ mild sob and inc abd girth States she is watching sodium and elevating legs  Takes lasix daily unless she has am appt-then takes it when she gets home Took 2 yesterday-voiced imp Will inc lasix to 80 mg once daily this week -check bmp at [facility] in a week  Urged family to take daily weights -keep a log and call cardiology in 3-4 d with progress /symptoms (earlier if worse)        Relevant Medications   furosemide (LASIX) 40 MG tablet     Respiratory   CAP (community acquired pneumonia)    Improved clinically after augmentin course No longer coughing Hypoxia is baseline and she refuses 02 or other therapy

## 2015-08-26 NOTE — Progress Notes (Signed)
Pre visit review using our clinic review tool, if applicable. No additional management support is needed unless otherwise documented below in the visit note. 

## 2015-09-01 ENCOUNTER — Telehealth: Payer: Self-pay | Admitting: Internal Medicine

## 2015-09-01 NOTE — Telephone Encounter (Signed)
Will forward to Dr. Ladona Ridgelaylor for review. The patient saw Dr. Milinda Antisower last week (3/28) and was told to increase lasix to 40 mg two tablets daily, check daily weights, and call cardiology this week to follow up. I called and spoke with April, the patient's grand daughter.  She reports the patient's weight 3/29 was 237.4 lbs, 4/2- 230.9 lbs, and today 233.9 lbs April is unsure of what the baseline weight is. She did eat sausage/ eggs/ grits yesterday morning, but aside from that ,she has not been adding salt or eating processed meats. Her fluid intake has been the same for her.  She is still having swelling. She is also SOB, but this is at baseline for her. The patient is coming here tomorrow for a BMP to be done. I advised I will forward to Dr. Ladona Ridgelaylor to review and give further instruction on lasix dosing. April is wondering what her fluid intake should be. She states if we try to call her tomorrow and can't reach her, to call her mother at [phone].

## 2015-09-01 NOTE — Telephone Encounter (Signed)
New message       Pt c/o medication issue:  1. Name of Medication: lasix 2. How are you currently taking this medication (dosage and times per day)?  40mg  bid 3. Are you having a reaction (difficulty breathing--STAT)? no 4. What is your medication issue?  Calling to give update on increased lasix.  This am wt is 233.9 which is a 4lb wt loss in 1 week.

## 2015-09-02 ENCOUNTER — Other Ambulatory Visit (INDEPENDENT_AMBULATORY_CARE_PROVIDER_SITE_OTHER): Payer: Medicare Other | Admitting: *Deleted

## 2015-09-02 DIAGNOSIS — I5022 Chronic systolic (congestive) heart failure: Secondary | ICD-10-CM | POA: Diagnosis not present

## 2015-09-02 LAB — BASIC METABOLIC PANEL
BUN: 29 mg/dL — ABNORMAL HIGH (ref 7–25)
CALCIUM: 8.6 mg/dL (ref 8.6–10.4)
CO2: 36 mmol/L — ABNORMAL HIGH (ref 20–31)
Chloride: 96 mmol/L — ABNORMAL LOW (ref 98–110)
Creat: 2.01 mg/dL — ABNORMAL HIGH (ref 0.60–0.88)
GLUCOSE: 103 mg/dL — AB (ref 65–99)
POTASSIUM: 4.1 mmol/L (ref 3.5–5.3)
SODIUM: 145 mmol/L (ref 135–146)

## 2015-09-02 NOTE — Addendum Note (Signed)
Addended by: Tonita PhoenixBOWDEN, Darriona Dehaas K on: 09/02/2015 01:10 PM   Modules accepted: Orders

## 2015-09-02 NOTE — Telephone Encounter (Signed)
When patient was here getting labs she was told to continue her Furosemide bid.  Will call back after Dr Ladona Ridgelaylor reviews labs

## 2015-09-02 NOTE — Addendum Note (Signed)
Addended by: BOWDEN, ROBIN K on: 09/02/2015 01:10 PM   Modules accepted: Orders  

## 2015-09-09 ENCOUNTER — Other Ambulatory Visit: Payer: Self-pay | Admitting: Internal Medicine

## 2015-09-09 NOTE — Telephone Encounter (Signed)
furosemide (LASIX) 40 MG tablet  Medication   Date: 08/26/2015  [facility]  Ordering/Authorizing: Judy PimpleMarne A Tower, MD      Order Providers    Prescribing Provider Encounter Provider   Judy PimpleMarne A Tower, MD Judy PimpleMarne A Tower, MD    Medication Detail      Disp Refills Start End     furosemide (LASIX) 40 MG tablet 60 tablet 5 08/26/2015     Sig - Route: Take 2 tablets (80 mg total) by mouth daily. - Oral    Notes to Pharmacy: JUST FILLED BY [facility] IN JAN FOR 90/0, PLACE THIS ON HOLD UNTIL NEEDED. (Pt states she only takes this when she is going to be at home all day-09/05/13)    E-Prescribing Status: Receipt confirmed by pharmacy (08/26/2015 11:52 AM EDT)     Associated Diagnoses    Chronic systolic heart failure (HCC) - Primary       Pharmacy    [phone] - [place], [place] - [address]

## 2015-09-15 ENCOUNTER — Other Ambulatory Visit: Payer: Self-pay | Admitting: Internal Medicine

## 2015-09-15 NOTE — Telephone Encounter (Signed)
New Message:  [name] is calling in to get the results to the pt's lab test that was done on 4/4. Please f/u with her

## 2015-09-16 NOTE — Telephone Encounter (Signed)
Dr Ladona Ridgelaylor reviewed labs and says the Creat of 2.0 is the new normal for her.  She was volume over loaded and needs to be on Furosemide 40 mg bid.  She is down 10 ponds and seems to be doing better.  They are weighing her daily are working hard to limit salt.  They will call back if needed

## 2015-09-29 ENCOUNTER — Telehealth: Payer: Self-pay | Admitting: Internal Medicine

## 2015-09-29 DIAGNOSIS — I5022 Chronic systolic (congestive) heart failure: Secondary | ICD-10-CM

## 2015-09-29 NOTE — Telephone Encounter (Addendum)
Since 4/19(232) she has gone up. Today she is 238.  She is still taking the Furosemide 80 mg daily.  Just does not seem to be doing the job any longer.  Let her know I would discuss with Dr Ladona Ridgelaylor and call her back tomorrow with plan.

## 2015-09-29 NOTE — Telephone Encounter (Signed)
April is calling because her grandmother weight gain has gone up 08/27/15 -237.4 on 09/07/15 226.3 and 09/29/15 its 238.3. She is hardly eating , she is keeping her legs elevated .  Her legs have fluid all up pass her knee, and her stomach is more extended . She is now having more shortness of breath. When calling ask for Diane (daughter) she is at her mother's home. Please Call   Thanks

## 2015-09-30 MED ORDER — METOLAZONE 2.5 MG PO TABS: ORAL_TABLET | ORAL | Status: AC

## 2015-09-30 NOTE — Telephone Encounter (Signed)
Pt [facility] [place]

## 2015-09-30 NOTE — Telephone Encounter (Signed)
Discussed with Dr Ladona Ridgelaylor and he would like for her to start Metolazone 2.5 mg Wed/Fri and follow up with a BMP on Mon.  I have spoken with her daughter.  She is down 2 pounds this morning but still up 9 over all.  I let her know I would call in the Metolazone 2.5 mg and put in order for labs BMP on Monday.  I will call them back on Tues.  May need to start Potassium depending on BMP results.

## 2015-10-01 NOTE — Telephone Encounter (Signed)
Called and spoke with Diane, daughter, and she did take the medication.  Daughter says she may have gone a little more yesterday but hard to tell.  Patient is not in any distress currently.  Her weight today is 239.  She has had one dose of Metolazone yesterday.  Says may have helped some but hard to tell.. Her legs have begun to seep fluid.  I let her daughter know that it sounds like she is going to need to be hospitalized to get the fluid off.  She wants to try the new medication tomorrow again and if weight and fluid still the same will take her to the hospital on Friday.  I let her know I would call them tomorrow afternoon.

## 2015-10-01 NOTE — Telephone Encounter (Signed)
New message  Granddaughter is call for [name] who is with the pt now.   Granddaughter at work verbalized from her Mom that the pts legs are weeping and daughter [name] is applying paper towels to legs and at bedtime both legs with paper towels applied.    Please call and speak with [name] at cell provided.

## 2015-10-02 ENCOUNTER — Encounter (HOSPITAL_COMMUNITY): Payer: Self-pay | Admitting: Emergency Medicine

## 2015-10-02 ENCOUNTER — Emergency Department (HOSPITAL_COMMUNITY): Payer: Medicare Other

## 2015-10-02 ENCOUNTER — Inpatient Hospital Stay (HOSPITAL_COMMUNITY)
Admission: EM | Admit: 2015-10-02 | Discharge: 2015-10-30 | DRG: 291 | Disposition: E | Payer: Medicare Other | Attending: Internal Medicine | Admitting: Internal Medicine

## 2015-10-02 DIAGNOSIS — Z87891 Personal history of nicotine dependence: Secondary | ICD-10-CM | POA: Diagnosis not present

## 2015-10-02 DIAGNOSIS — Z955 Presence of coronary angioplasty implant and graft: Secondary | ICD-10-CM | POA: Diagnosis not present

## 2015-10-02 DIAGNOSIS — Z7982 Long term (current) use of aspirin: Secondary | ICD-10-CM | POA: Diagnosis not present

## 2015-10-02 DIAGNOSIS — E785 Hyperlipidemia, unspecified: Secondary | ICD-10-CM | POA: Diagnosis present

## 2015-10-02 DIAGNOSIS — E872 Acidosis: Secondary | ICD-10-CM | POA: Diagnosis present

## 2015-10-02 DIAGNOSIS — L03116 Cellulitis of left lower limb: Secondary | ICD-10-CM | POA: Diagnosis present

## 2015-10-02 DIAGNOSIS — I4891 Unspecified atrial fibrillation: Secondary | ICD-10-CM | POA: Diagnosis present

## 2015-10-02 DIAGNOSIS — I248 Other forms of acute ischemic heart disease: Secondary | ICD-10-CM | POA: Diagnosis present

## 2015-10-02 DIAGNOSIS — Z6841 Body Mass Index (BMI) 40.0 and over, adult: Secondary | ICD-10-CM | POA: Diagnosis not present

## 2015-10-02 DIAGNOSIS — B961 Klebsiella pneumoniae [K. pneumoniae] as the cause of diseases classified elsewhere: Secondary | ICD-10-CM | POA: Diagnosis present

## 2015-10-02 DIAGNOSIS — I481 Persistent atrial fibrillation: Secondary | ICD-10-CM | POA: Diagnosis not present

## 2015-10-02 DIAGNOSIS — Z8701 Personal history of pneumonia (recurrent): Secondary | ICD-10-CM

## 2015-10-02 DIAGNOSIS — N183 Chronic kidney disease, stage 3 unspecified: Secondary | ICD-10-CM | POA: Diagnosis present

## 2015-10-02 DIAGNOSIS — I130 Hypertensive heart and chronic kidney disease with heart failure and stage 1 through stage 4 chronic kidney disease, or unspecified chronic kidney disease: Principal | ICD-10-CM | POA: Diagnosis present

## 2015-10-02 DIAGNOSIS — I5023 Acute on chronic systolic (congestive) heart failure: Secondary | ICD-10-CM | POA: Diagnosis not present

## 2015-10-02 DIAGNOSIS — I5043 Acute on chronic combined systolic (congestive) and diastolic (congestive) heart failure: Secondary | ICD-10-CM | POA: Diagnosis present

## 2015-10-02 DIAGNOSIS — J189 Pneumonia, unspecified organism: Secondary | ICD-10-CM | POA: Diagnosis present

## 2015-10-02 DIAGNOSIS — R0603 Acute respiratory distress: Secondary | ICD-10-CM

## 2015-10-02 DIAGNOSIS — J9621 Acute and chronic respiratory failure with hypoxia: Secondary | ICD-10-CM | POA: Diagnosis present

## 2015-10-02 DIAGNOSIS — D696 Thrombocytopenia, unspecified: Secondary | ICD-10-CM | POA: Diagnosis present

## 2015-10-02 DIAGNOSIS — Z515 Encounter for palliative care: Secondary | ICD-10-CM | POA: Insufficient documentation

## 2015-10-02 DIAGNOSIS — I252 Old myocardial infarction: Secondary | ICD-10-CM | POA: Diagnosis not present

## 2015-10-02 DIAGNOSIS — I2510 Atherosclerotic heart disease of native coronary artery without angina pectoris: Secondary | ICD-10-CM | POA: Diagnosis present

## 2015-10-02 DIAGNOSIS — I429 Cardiomyopathy, unspecified: Secondary | ICD-10-CM | POA: Diagnosis present

## 2015-10-02 DIAGNOSIS — J9601 Acute respiratory failure with hypoxia: Secondary | ICD-10-CM | POA: Diagnosis not present

## 2015-10-02 DIAGNOSIS — D6959 Other secondary thrombocytopenia: Secondary | ICD-10-CM | POA: Diagnosis present

## 2015-10-02 DIAGNOSIS — R0600 Dyspnea, unspecified: Secondary | ICD-10-CM | POA: Diagnosis present

## 2015-10-02 DIAGNOSIS — Z951 Presence of aortocoronary bypass graft: Secondary | ICD-10-CM | POA: Diagnosis not present

## 2015-10-02 DIAGNOSIS — F0390 Unspecified dementia without behavioral disturbance: Secondary | ICD-10-CM | POA: Diagnosis present

## 2015-10-02 DIAGNOSIS — N390 Urinary tract infection, site not specified: Secondary | ICD-10-CM | POA: Diagnosis present

## 2015-10-02 DIAGNOSIS — Z950 Presence of cardiac pacemaker: Secondary | ICD-10-CM | POA: Diagnosis not present

## 2015-10-02 DIAGNOSIS — Z7902 Long term (current) use of antithrombotics/antiplatelets: Secondary | ICD-10-CM

## 2015-10-02 DIAGNOSIS — H409 Unspecified glaucoma: Secondary | ICD-10-CM | POA: Diagnosis present

## 2015-10-02 DIAGNOSIS — N179 Acute kidney failure, unspecified: Secondary | ICD-10-CM | POA: Diagnosis present

## 2015-10-02 DIAGNOSIS — I9589 Other hypotension: Secondary | ICD-10-CM | POA: Diagnosis not present

## 2015-10-02 DIAGNOSIS — Z7189 Other specified counseling: Secondary | ICD-10-CM | POA: Insufficient documentation

## 2015-10-02 DIAGNOSIS — Z79899 Other long term (current) drug therapy: Secondary | ICD-10-CM | POA: Diagnosis not present

## 2015-10-02 DIAGNOSIS — I959 Hypotension, unspecified: Secondary | ICD-10-CM | POA: Diagnosis present

## 2015-10-02 DIAGNOSIS — N189 Chronic kidney disease, unspecified: Secondary | ICD-10-CM

## 2015-10-02 DIAGNOSIS — I509 Heart failure, unspecified: Secondary | ICD-10-CM | POA: Diagnosis not present

## 2015-10-02 DIAGNOSIS — I1310 Hypertensive heart and chronic kidney disease without heart failure, with stage 1 through stage 4 chronic kidney disease, or unspecified chronic kidney disease: Secondary | ICD-10-CM | POA: Diagnosis not present

## 2015-10-02 DIAGNOSIS — Z8249 Family history of ischemic heart disease and other diseases of the circulatory system: Secondary | ICD-10-CM

## 2015-10-02 DIAGNOSIS — J9602 Acute respiratory failure with hypercapnia: Secondary | ICD-10-CM | POA: Diagnosis not present

## 2015-10-02 DIAGNOSIS — J9622 Acute and chronic respiratory failure with hypercapnia: Secondary | ICD-10-CM | POA: Diagnosis present

## 2015-10-02 DIAGNOSIS — Z66 Do not resuscitate: Secondary | ICD-10-CM | POA: Diagnosis present

## 2015-10-02 DIAGNOSIS — E039 Hypothyroidism, unspecified: Secondary | ICD-10-CM | POA: Diagnosis present

## 2015-10-02 HISTORY — DX: Reserved for inherently not codable concepts without codable children: IMO0001

## 2015-10-02 HISTORY — DX: Heart failure, unspecified: I50.9

## 2015-10-02 HISTORY — DX: Cardiac murmur, unspecified: R01.1

## 2015-10-02 LAB — I-STAT CHEM 8, ED
BUN: 59 mg/dL — ABNORMAL HIGH (ref 6–20)
Calcium, Ion: 0.92 mmol/L — ABNORMAL LOW (ref 1.13–1.30)
Chloride: 94 mmol/L — ABNORMAL LOW (ref 101–111)
Creatinine, Ser: 2.4 mg/dL — ABNORMAL HIGH (ref 0.44–1.00)
Glucose, Bld: 135 mg/dL — ABNORMAL HIGH (ref 65–99)
HCT: 55 % — ABNORMAL HIGH (ref 36.0–46.0)
Hemoglobin: 18.7 g/dL — ABNORMAL HIGH (ref 12.0–15.0)
Potassium: 5.1 mmol/L (ref 3.5–5.1)
Sodium: 133 mmol/L — ABNORMAL LOW (ref 135–145)
TCO2: 30 mmol/L (ref 0–100)

## 2015-10-02 LAB — COMPREHENSIVE METABOLIC PANEL
ALT: 17 U/L (ref 14–54)
AST: 26 U/L (ref 15–41)
Albumin: 3.4 g/dL — ABNORMAL LOW (ref 3.5–5.0)
Alkaline Phosphatase: 50 U/L (ref 38–126)
Anion gap: 15 (ref 5–15)
BUN: 44 mg/dL — ABNORMAL HIGH (ref 6–20)
CO2: 28 mmol/L (ref 22–32)
Calcium: 8.9 mg/dL (ref 8.9–10.3)
Chloride: 93 mmol/L — ABNORMAL LOW (ref 101–111)
Creatinine, Ser: 2.8 mg/dL — ABNORMAL HIGH (ref 0.44–1.00)
GFR calc Af Amer: 16 mL/min — ABNORMAL LOW (ref >60)
GFR calc non Af Amer: 14 mL/min — ABNORMAL LOW (ref >60)
Glucose, Bld: 143 mg/dL — ABNORMAL HIGH (ref 65–99)
Potassium: 4.9 mmol/L (ref 3.5–5.1)
Sodium: 136 mmol/L (ref 135–145)
Total Bilirubin: 2.8 mg/dL — ABNORMAL HIGH (ref 0.3–1.2)
Total Protein: 6.3 g/dL — ABNORMAL LOW (ref 6.5–8.1)

## 2015-10-02 LAB — CBC WITH DIFFERENTIAL/PLATELET
Basophils Absolute: 0 10*3/uL (ref 0.0–0.1)
Basophils Relative: 0 %
Eosinophils Absolute: 0 10*3/uL (ref 0.0–0.7)
Eosinophils Relative: 0 %
HCT: 53 % — ABNORMAL HIGH (ref 36.0–46.0)
Hemoglobin: 15.5 g/dL — ABNORMAL HIGH (ref 12.0–15.0)
Lymphocytes Relative: 14 %
Lymphs Abs: 1.3 10*3/uL (ref 0.7–4.0)
MCH: 28.3 pg (ref 26.0–34.0)
MCHC: 29.2 g/dL — ABNORMAL LOW (ref 30.0–36.0)
MCV: 96.9 fL (ref 78.0–100.0)
Monocytes Absolute: 1.1 10*3/uL — ABNORMAL HIGH (ref 0.1–1.0)
Monocytes Relative: 12 %
Neutro Abs: 6.4 10*3/uL (ref 1.7–7.7)
Neutrophils Relative %: 74 %
Platelets: 139 10*3/uL — ABNORMAL LOW (ref 150–400)
RBC: 5.47 MIL/uL — ABNORMAL HIGH (ref 3.87–5.11)
RDW: 15.3 % (ref 11.5–15.5)
WBC: 8.8 10*3/uL (ref 4.0–10.5)

## 2015-10-02 LAB — CREATININE, SERUM
CREATININE: 2.86 mg/dL — AB (ref 0.44–1.00)
GFR, EST AFRICAN AMERICAN: 16 mL/min — AB (ref >60)
GFR, EST NON AFRICAN AMERICAN: 13 mL/min — AB (ref >60)

## 2015-10-02 LAB — I-STAT ARTERIAL BLOOD GAS, ED
ACID-BASE EXCESS: 4 mmol/L — AB (ref 0.0–2.0)
Acid-Base Excess: 3 mmol/L — ABNORMAL HIGH (ref 0.0–2.0)
BICARBONATE: 33.7 meq/L — AB (ref 20.0–24.0)
Bicarbonate: 32.4 mEq/L — ABNORMAL HIGH (ref 20.0–24.0)
O2 Saturation: 95 %
O2 Saturation: 96 %
PH ART: 7.303 — AB (ref 7.350–7.450)
Patient temperature: 98.7
TCO2: 34 mmol/L (ref 0–100)
TCO2: 36 mmol/L (ref 0–100)
pCO2 arterial: 68.5 mmHg (ref 35.0–45.0)
pCO2 arterial: 68.1 mmHg (ref 35.0–45.0)
pH, Arterial: 7.283 — ABNORMAL LOW (ref 7.350–7.450)
pO2, Arterial: 86 mmHg (ref 80.0–100.0)
pO2, Arterial: 95 mmHg (ref 80.0–100.0)

## 2015-10-02 LAB — URINALYSIS, ROUTINE W REFLEX MICROSCOPIC
Bilirubin Urine: NEGATIVE
Glucose, UA: NEGATIVE mg/dL
Ketones, ur: NEGATIVE mg/dL
Nitrite: POSITIVE — AB
Protein, ur: NEGATIVE mg/dL
Specific Gravity, Urine: 1.011 (ref 1.005–1.030)
pH: 5 (ref 5.0–8.0)

## 2015-10-02 LAB — CBC
HEMATOCRIT: 45.3 % (ref 36.0–46.0)
HEMOGLOBIN: 13.5 g/dL (ref 12.0–15.0)
MCH: 29.3 pg (ref 26.0–34.0)
MCHC: 29.8 g/dL — AB (ref 30.0–36.0)
MCV: 98.5 fL (ref 78.0–100.0)
PLATELETS: 85 10*3/uL — AB (ref 150–400)
RBC: 4.6 MIL/uL (ref 3.87–5.11)
RDW: 15.5 % (ref 11.5–15.5)
WBC: 5.6 10*3/uL (ref 4.0–10.5)

## 2015-10-02 LAB — URINE MICROSCOPIC-ADD ON

## 2015-10-02 LAB — I-STAT CG4 LACTIC ACID, ED: Lactic Acid, Venous: 2.36 mmol/L (ref 0.5–2.0)

## 2015-10-02 LAB — TROPONIN I: Troponin I: 0.2 ng/mL — ABNORMAL HIGH (ref <0.031)

## 2015-10-02 LAB — BRAIN NATRIURETIC PEPTIDE: B Natriuretic Peptide: 1179 pg/mL — ABNORMAL HIGH (ref 0.0–100.0)

## 2015-10-02 MED ORDER — AMIODARONE HCL 200 MG PO TABS
100.0000 mg | ORAL_TABLET | Freq: Every day | ORAL | Status: DC
  Administered 2015-10-02 – 2015-10-03 (×2): 100 mg via ORAL
  Filled 2015-10-02 (×2): qty 1

## 2015-10-02 MED ORDER — ALBUTEROL SULFATE (2.5 MG/3ML) 0.083% IN NEBU
5.0000 mg | INHALATION_SOLUTION | Freq: Once | RESPIRATORY_TRACT | Status: AC
  Administered 2015-10-02: 5 mg via RESPIRATORY_TRACT
  Filled 2015-10-02: qty 6

## 2015-10-02 MED ORDER — LEVOTHYROXINE SODIUM 50 MCG PO TABS
50.0000 ug | ORAL_TABLET | Freq: Every day | ORAL | Status: DC
  Administered 2015-10-03 – 2015-10-04 (×2): 50 ug via ORAL
  Filled 2015-10-02 (×2): qty 1

## 2015-10-02 MED ORDER — VANCOMYCIN HCL 10 G IV SOLR
1500.0000 mg | INTRAVENOUS | Status: DC
  Filled 2015-10-02: qty 1500

## 2015-10-02 MED ORDER — VANCOMYCIN HCL 10 G IV SOLR
2000.0000 mg | Freq: Once | INTRAVENOUS | Status: AC
  Administered 2015-10-02: 2000 mg via INTRAVENOUS
  Filled 2015-10-02: qty 2000

## 2015-10-02 MED ORDER — SODIUM CHLORIDE 0.9 % IV BOLUS (SEPSIS): 250.0000 mL | Freq: Once | INTRAVENOUS | Status: DC

## 2015-10-02 MED ORDER — LATANOPROST 0.005 % OP SOLN
1.0000 [drp] | Freq: Every day | OPHTHALMIC | Status: DC
  Administered 2015-10-02 – 2015-10-08 (×5): 1 [drp] via OPHTHALMIC
  Filled 2015-10-02: qty 2.5

## 2015-10-02 MED ORDER — VANCOMYCIN HCL IN DEXTROSE 1-5 GM/200ML-% IV SOLN: 1000.0000 mg | Freq: Once | INTRAVENOUS | Status: DC

## 2015-10-02 MED ORDER — SODIUM CHLORIDE 0.9 % IV SOLN: 250.0000 mL | INTRAVENOUS | Status: DC | PRN

## 2015-10-02 MED ORDER — MORPHINE SULFATE (PF) 2 MG/ML IV SOLN
2.0000 mg | Freq: Once | INTRAVENOUS | Status: AC
  Administered 2015-10-02: 2 mg via INTRAVENOUS
  Filled 2015-10-02: qty 1

## 2015-10-02 MED ORDER — PIPERACILLIN-TAZOBACTAM 3.375 G IVPB 30 MIN
3.3750 g | Freq: Once | INTRAVENOUS | Status: AC
  Administered 2015-10-02: 3.375 g via INTRAVENOUS
  Filled 2015-10-02: qty 50

## 2015-10-02 MED ORDER — FUROSEMIDE 10 MG/ML IJ SOLN
40.0000 mg | Freq: Once | INTRAMUSCULAR | Status: AC
  Administered 2015-10-02: 40 mg via INTRAVENOUS
  Filled 2015-10-02: qty 4

## 2015-10-02 MED ORDER — DEXTROSE 5 % IV SOLN
1.0000 g | INTRAVENOUS | Status: DC
  Administered 2015-10-02 – 2015-10-04 (×3): 1 g via INTRAVENOUS
  Filled 2015-10-02 (×5): qty 1

## 2015-10-02 MED ORDER — SODIUM CHLORIDE 0.9 % IV BOLUS (SEPSIS)
250.0000 mL | Freq: Once | INTRAVENOUS | Status: AC
  Administered 2015-10-02: 250 mL via INTRAVENOUS

## 2015-10-02 MED ORDER — CARVEDILOL 6.25 MG PO TABS
6.2500 mg | ORAL_TABLET | Freq: Two times a day (BID) | ORAL | Status: DC
  Administered 2015-10-02: 6.25 mg via ORAL
  Filled 2015-10-02: qty 1

## 2015-10-02 MED ORDER — ALBUMIN HUMAN 25 % IV SOLN
12.5000 g | Freq: Once | INTRAVENOUS | Status: AC
  Administered 2015-10-02: 12.5 g via INTRAVENOUS
  Filled 2015-10-02: qty 50

## 2015-10-02 MED ORDER — PIPERACILLIN-TAZOBACTAM IN DEX 2-0.25 GM/50ML IV SOLN
2.2500 g | Freq: Four times a day (QID) | INTRAVENOUS | Status: DC
  Filled 2015-10-02 (×2): qty 50

## 2015-10-02 MED ORDER — CETYLPYRIDINIUM CHLORIDE 0.05 % MT LIQD
7.0000 mL | Freq: Two times a day (BID) | OROMUCOSAL | Status: DC
  Administered 2015-10-02 – 2015-10-08 (×7): 7 mL via OROMUCOSAL

## 2015-10-02 MED ORDER — ROSUVASTATIN CALCIUM 10 MG PO TABS
10.0000 mg | ORAL_TABLET | Freq: Every day | ORAL | Status: DC
  Administered 2015-10-02: 10 mg via ORAL
  Filled 2015-10-02: qty 1

## 2015-10-02 MED ORDER — CLOPIDOGREL BISULFATE 75 MG PO TABS
75.0000 mg | ORAL_TABLET | Freq: Every day | ORAL | Status: DC
  Administered 2015-10-02: 75 mg via ORAL
  Filled 2015-10-02 (×2): qty 1

## 2015-10-02 MED ORDER — OXYCODONE HCL 5 MG PO TABS
5.0000 mg | ORAL_TABLET | Freq: Four times a day (QID) | ORAL | Status: DC | PRN
  Administered 2015-10-02: 5 mg via ORAL
  Filled 2015-10-02: qty 1

## 2015-10-02 MED ORDER — ENOXAPARIN SODIUM 30 MG/0.3ML ~~LOC~~ SOLN
30.0000 mg | SUBCUTANEOUS | Status: DC
  Administered 2015-10-02: 30 mg via SUBCUTANEOUS
  Filled 2015-10-02: qty 0.3

## 2015-10-02 MED ORDER — SODIUM CHLORIDE 0.9 % IV BOLUS (SEPSIS)
500.0000 mL | Freq: Once | INTRAVENOUS | Status: AC
  Administered 2015-10-02: 500 mL via INTRAVENOUS

## 2015-10-02 MED ORDER — ASPIRIN EC 81 MG PO TBEC
81.0000 mg | DELAYED_RELEASE_TABLET | Freq: Every day | ORAL | Status: DC
  Administered 2015-10-02 – 2015-10-03 (×2): 81 mg via ORAL
  Filled 2015-10-02 (×2): qty 1

## 2015-10-02 MED ORDER — ACETAMINOPHEN 325 MG PO TABS
650.0000 mg | ORAL_TABLET | ORAL | Status: DC | PRN
  Administered 2015-10-02 – 2015-10-03 (×2): 650 mg via ORAL
  Filled 2015-10-02 (×2): qty 2

## 2015-10-02 MED ORDER — NITROGLYCERIN IN D5W 200-5 MCG/ML-% IV SOLN
5.0000 ug/min | INTRAVENOUS | Status: DC
  Administered 2015-10-02: 5 ug/min via INTRAVENOUS
  Filled 2015-10-02: qty 250

## 2015-10-02 MED ORDER — ONDANSETRON HCL 4 MG/2ML IJ SOLN: 4.0000 mg | Freq: Four times a day (QID) | INTRAMUSCULAR | Status: DC | PRN

## 2015-10-02 NOTE — Progress Notes (Signed)
Pt. Placed on venturi mask to assist with pt. Sats.

## 2015-10-02 NOTE — Progress Notes (Signed)
eLink Physician-Brief Progress Note Patient Name: Stephanie PrimusSarah N [name] DOB: 10/19/1921 MRN: [id number]   Date of Service  10/13/2015  HPI/Events of Note  Ongoing hypotension in the setting of attempts at diuresis for treatment of CHF.  Earlier had received a 250 cc bolus of NS for BP support with no improvement in BP.  Current BP of 37/26.  Is a DNR.  eICU Interventions  Plan: Will try 25% albumin IV x one. PCCM to bedside to evaluate as [facility] cannot camera into room.     Intervention Category Major Interventions: Hypotension - evaluation and management  [facility] Heying 10/01/2015, 11:42 PM

## 2015-10-02 NOTE — ED Provider Notes (Signed)
CSN: [id number]     Arrival date & time 10/28/2015  0557 History   First MD Initiated Contact with Patient 10/01/2015 [phone]     Chief Complaint  Patient presents with  . Respiratory Distress     (Consider location/radiation/quality/duration/timing/severity/associated sxs/prior Treatment) HPI Patient presents to the emergency department with greasy, shortness of breath over the last week.  The patient states the last 24 hours.  She has gotten worse.  She states that her legs have been swollen and she developed redness over that same time frame.  The patient states that her legs hurt very bad.  She states that nothing seems make the condition better.  The patient states that she talked to her cardiologist about her increasing swelling and they gave her another pill to take for the fluidThe patient denies chest pain,  headache,blurred vision, neck pain, fever, cough, weakness, numbness, dizziness, anorexia, abdominal pain, nausea, vomiting, diarrhea, rash, back pain, dysuria, hematemesis, bloody stool, near syncope, or syncope. Past Medical History  Diagnosis Date  . Atrial fibrillation (HCC)   . Hyperlipidemia   . Myocardial infarction (HCC)   . Hyperlipidemia   . Osteoarthritis   . ICD (implantable cardiac defibrillator) in place     [employer]  . LBBB (left bundle branch block)   . Ventricular tachycardia (HCC)   . Cardiomyopathy   . Glaucoma   . History of uterine cancer   . History of total hysterectomy   . Coronary artery disease     hx of MI  . Hypertension   . Hypoxia 12/12    suspect copd but pt refuses any pulmonary work up or treatment   Past Surgical History  Procedure Laterality Date  . Coronary artery bypass graft  1995  . Cataract extraction    . Cholecystectomy    . Coronary angioplasty    . Hernia repair    . Coronary angioplasty with stent placement    . Cardiac defibrillator placement    . Cardiac defibrillator placement    . Abdominal hysterectomy  1989    total   . Doppler echocardiography  2008  . Bi-ventricular pacemaker insertion N/A 07/23/2011    Procedure: BI-VENTRICULAR PACEMAKER INSERTION (CRT-P);  Surgeon: Marinus MawGregg W Taylor, MD;  [facility];  Service: Cardiovascular;  Laterality: N/A;   Family History  Problem Relation Age of Onset  . Heart attack Father   . Heart disease Father     MI  . Diverticulitis Mother   . Ovarian cancer Sister   . Cancer Sister     ovarian CA   Social History  Substance Use Topics  . Smoking status: Former Smoker    Quit date: 10/20/1982  . Smokeless tobacco: None  . Alcohol Use: No   OB History    No data available     Review of Systems  All other systems negative except as documented in the HPI. All pertinent positives and negatives as reviewed in the HPI.  Allergies  Review of patient's allergies indicates no known allergies.  Home Medications   Prior to Admission medications   Medication Sig Start Date End Date Taking? Authorizing Provider  amiodarone (PACERONE) 200 MG tablet TAKE 1/2 TABLET EVERY DAY 12/20/14  Yes Marinus MawGregg W Taylor, MD  aspirin EC 81 MG tablet Take 81 mg by mouth daily.   Yes Historical Provider, MD  carvedilol (COREG) 6.25 MG tablet TAKE 1 TABLET TWICE DAILY 12/20/14  Yes Marinus MawGregg W Taylor, MD  clopidogrel (PLAVIX) 75 MG  tablet TAKE 1 TABLET EVERY DAY 12/20/14  Yes Marinus Maw, MD  furosemide (LASIX) 40 MG tablet Take 2 tablets (80 mg total) by mouth daily. 08/26/15  Yes Marne A Tower, MD  latanoprost (XALATAN) 0.005 % ophthalmic solution Place 1 drop into the left eye at bedtime.    Yes Historical Provider, MD  levothyroxine (SYNTHROID, LEVOTHROID) 50 MCG tablet TAKE 1 TABLET THREE TIMES WEEKLY Patient taking differently: TAKE 1 TABLET THREE TIMES WEEKLY (MWF) 12/20/14  Yes Marinus Maw, MD  lisinopril (PRINIVIL,ZESTRIL) 10 MG tablet TAKE 1 TABLET EVERY DAY 12/20/14  Yes Marinus Maw, MD  metolazone (ZAROXOLYN) 2.5 MG tablet Take one tablet by mouth 30 min prior to  Furosemide dose on Mon and Fri 09/30/15  Yes Marinus Maw, MD  Multiple Vitamins-Minerals (CENTRUM SILVER ADULT 50+ PO) Take 1 tablet by mouth daily.   Yes Historical Provider, MD  rosuvastatin (CRESTOR) 10 MG tablet TAKE 1 TABLET AT BEDTIME 12/20/14  Yes Marinus Maw, MD   BP 102/91 mmHg  Pulse 70  Temp(Src) 98.7 F (37.1 C) (Oral)  Resp 24  Ht 5\' 3"  (1.6 m)  Wt 113.399 kg  BMI 44.30 kg/m2  SpO2 95% Physical Exam  Constitutional: She is oriented to person, place, and time. She appears well-developed and well-nourished. No distress.  HENT:  Head: Normocephalic and atraumatic.  Mouth/Throat: Oropharynx is clear and moist.  Eyes: Pupils are equal, round, and reactive to light.  Neck: Normal range of motion. Neck supple.  Cardiovascular: Normal rate, regular rhythm and normal heart sounds.  Exam reveals no gallop and no friction rub.   No murmur heard. Pulmonary/Chest: Effort normal and breath sounds normal. No respiratory distress. She has no wheezes.  Abdominal: Soft. Bowel sounds are normal. She exhibits no distension. There is no tenderness.  Neurological: She is alert and oriented to person, place, and time. She exhibits normal muscle tone. Coordination normal.  Skin: Skin is warm and dry. No rash noted. No erythema.  Psychiatric: She has a normal mood and affect. Her behavior is normal.  Nursing note and vitals reviewed.   ED Course  Procedures (including critical care time) Labs Review Labs Reviewed  CBC WITH DIFFERENTIAL/PLATELET - Abnormal; Notable for the following:    RBC 5.47 (*)    Hemoglobin 15.5 (*)    HCT 53.0 (*)    MCHC 29.2 (*)    Platelets 139 (*)    Monocytes Absolute 1.1 (*)    All other components within normal limits  COMPREHENSIVE METABOLIC PANEL - Abnormal; Notable for the following:    Chloride 93 (*)    Glucose, Bld 143 (*)    BUN 44 (*)    Creatinine, Ser 2.80 (*)    Total Protein 6.3 (*)    Albumin 3.4 (*)    Total Bilirubin 2.8 (*)    GFR  calc non Af Amer 14 (*)    GFR calc Af Amer 16 (*)    All other components within normal limits  URINALYSIS, ROUTINE W REFLEX MICROSCOPIC (NOT AT [facility]) - Abnormal; Notable for the following:    APPearance CLOUDY (*)    Hgb urine dipstick SMALL (*)    Nitrite POSITIVE (*)    Leukocytes, UA SMALL (*)    All other components within normal limits  TROPONIN I - Abnormal; Notable for the following:    Troponin I 0.20 (*)    All other components within normal limits  BRAIN NATRIURETIC PEPTIDE - Abnormal; Notable  for the following:    B Natriuretic Peptide 1179.0 (*)    All other components within normal limits  URINE MICROSCOPIC-ADD ON - Abnormal; Notable for the following:    Squamous Epithelial / LPF 0-5 (*)    Bacteria, UA MANY (*)    Casts HYALINE CASTS (*)    All other components within normal limits  I-STAT CG4 LACTIC ACID, ED - Abnormal; Notable for the following:    Lactic Acid, Venous 2.36 (*)    All other components within normal limits  I-STAT ARTERIAL BLOOD GAS, ED - Abnormal; Notable for the following:    pH, Arterial 7.283 (*)    pCO2 arterial 68.5 (*)    Bicarbonate 32.4 (*)    Acid-Base Excess 3.0 (*)    All other components within normal limits  I-STAT CHEM 8, ED - Abnormal; Notable for the following:    Sodium 133 (*)    Chloride 94 (*)    BUN 59 (*)    Creatinine, Ser 2.40 (*)    Glucose, Bld 135 (*)    Calcium, Ion 0.92 (*)    Hemoglobin 18.7 (*)    HCT 55.0 (*)    All other components within normal limits  I-STAT ARTERIAL BLOOD GAS, ED - Abnormal; Notable for the following:    pH, Arterial 7.303 (*)    pCO2 arterial 68.1 (*)    Bicarbonate 33.7 (*)    Acid-Base Excess 4.0 (*)    All other components within normal limits  CULTURE, BLOOD (ROUTINE X 2)  CULTURE, BLOOD (ROUTINE X 2)  URINE CULTURE    Imaging Review Dg Chest Portable 1 View  10/05/2015  CLINICAL DATA:  Shortness of breath for a week, worse today. EXAM: PORTABLE CHEST 1 VIEW COMPARISON:   08/06/2015 FINDINGS: Postoperative changes in the mediastinum. Cardiac pacemaker. Cardiac enlargement. No vascular congestion. Bilateral basilar infiltration or atelectasis with small bilateral pleural effusions. Changes are progressing since previous study. No pneumothorax. Calcified and tortuous aorta. IMPRESSION: Progressing changes of infiltration or atelectasis in the lung bases and small bilateral pleural effusions. Electronically Signed   By: Burman Nieves M.D.   On: 10/27/2015 06:46   I have personally reviewed and evaluated these images and lab results as part of my medical decision-making.   EKG Interpretation   Date/Time:  Thursday Oct 02 2015 06:57:10 EDT Ventricular Rate:  74 PR Interval:    QRS Duration: 171 QT Interval:  431 QTC Calculation: 478 R Axis:   148 Text Interpretation:  Atrial fibrillation Nonspecific intraventricular  conduction delay Borderline abnrm T, anterolateral leads Confirmed by DELO   MD, DOUGLAS ([id number]) on 10/16/2015 7:33:37 AM      CRITICAL CARE Performed by: Carlyle Dolly Total critical care time:45 minutes Critical care time was exclusive of separately billable procedures and treating other patients. Critical care was necessary to treat or prevent imminent or life-threatening deterioration. Critical care was time spent personally by me on the following activities: development of treatment plan with patient and/or surrogate as well as nursing, discussions with consultants, evaluation of patient's response to treatment, examination of patient, obtaining history from patient or surrogate, ordering and performing treatments and interventions, ordering and review of laboratory studies, ordering and review of radiographic studies, pulse oximetry and re-evaluation of patient's condition. Patient was fairly ill with severe tachypnea and hypoxia.  The patient initially had a pulse oximetry of 72% on room air.  Patient was placed on BiPAP.  I spoke with  critical care, but the  patient, who will be down to evaluate her further.  He was given nitroglycerin, Lasix and albuterol.   Charlestine Night, PA-C 10/10/2015 1458  Geoffery Lyons, MD 10/03/15 [phone]

## 2015-10-02 NOTE — ED Notes (Signed)
Lawyer PA made aware of pt drop in Bp.  V/O to pause nitro drip and administer 500NS bolus.  Bolus initiated at this time.

## 2015-10-02 NOTE — ED Notes (Signed)
Attempted report 

## 2015-10-02 NOTE — Progress Notes (Addendum)
Pharmacy Antibiotic Note  [name] is a 82 y.o. female admitted on 10/22/2015 with sepsis.  Pharmacy has been consulted for vancomycin and zosyn dosing. Pt is afebrile and WBC is WNL. Scr is elevated at 2.8 and lactic acid is elevated at 2.36.   Plan: - Vanc 2gm IV x 1 then 1500mg  IV Q48H - Zosyn 3.375gm IV x 1 then 2.25gm IV Q6H - F/u renal fxn, C&S, clinical status and trough at SS  Height: 5\' 3"  (160 cm) Weight: 250 lb (113.399 kg) IBW/kg (Calculated) : 52.4  Temp (24hrs), Avg:98.3 F (36.8 C), Min:97.8 F (36.6 C), Max:98.7 F (37.1 C)   Recent Labs Lab 10/16/2015 0620 10/01/2015 0656 10/23/2015 0657  WBC 8.8  --   --   CREATININE 2.80* 2.40*  --   LATICACIDVEN  --   --  2.36*    Estimated Creatinine Clearance: 18.5 mL/min (by C-G formula based on Cr of 2.4).    No Known Allergies  Antimicrobials this admission: Vanc 5/4>> Zosyn 5/4>>  Dose adjustments this admission: N/A  Microbiology results: Pending  Thank you for allowing pharmacy to be a part of this patient's care.  Arnola Crittendon, Drake LeachRachel Lynn 10/22/2015 8:16 AM  Addendum:  Changing zosyn to cefepime.   Plan: - Cefepime 1gm IV Q24H - F/u renal fxn, C&S, clinical status  Lysle Pearlachel Brylon Brenning, PharmD, BCPS Pager # [phone] 10/25/2015 12:06 PM

## 2015-10-02 NOTE — ED Notes (Signed)
RT at bedside to place pt on Bipap. PA-C, Lawyer remains at bedside. Pt continues with shortness of breath.

## 2015-10-02 NOTE — ED Notes (Signed)
Portable chest x-ray at bedside at this time. 

## 2015-10-02 NOTE — ED Notes (Signed)
[facility], with c/o respiratory distress and bilateral lower leg edema/redness x 1 week. Pt with obvious shortness of breath upon arrival...speaking only in short sentences. Sp02 RA 72%. Pt placed on NRB at 15L/min. Dr. Judd Lienelo notified of pt's arrival and status. Ebbie Ridgehris Lawyer, PA-C at bedside to speak with and assess pt. Denies chest pain at this time.

## 2015-10-02 NOTE — Progress Notes (Signed)
eLink Physician-Brief Progress Note Patient Name: Stephanie PrimusSarah N Dacquisto DOB: 14-Jun-1921 MRN: [id number]   Date of Service  10/21/2015  HPI/Events of Note  Hypotension - patient has a Hx of LVEF = 15% to 20%. SBP usually in 110's to 120's. However, BP now 73/46 in the setting of poor LV function, Coreg, Diuresis and Oxycodone. Patient given Oxycodone IR shortly before BP reading noted above.   eICU Interventions  Will order: 1. Hold Oxycodone dose for SBP < 90. 2. 0.9 NaCl 250 mL IV over 30 minutes now.      Intervention Category Major Interventions: Hypotension - evaluation and management  Sommer,Steven Dennard Nipugene 10/27/2015, 9:17 PM

## 2015-10-02 NOTE — ED Notes (Signed)
Pt moved to hospital bed and repositioned.  Left leg elevated.

## 2015-10-02 NOTE — Telephone Encounter (Signed)
I called this morning to check on patient and they have taken her to the ER.  When speaking with them yesterday I was thinking that this was going to be the best option for her, but not what patient wanted to do.  Daughter will keep me posted.

## 2015-10-02 NOTE — ED Notes (Signed)
Consulting MD at bedside

## 2015-10-02 NOTE — ED Notes (Signed)
Meal tray ordered for pt  

## 2015-10-02 NOTE — H&P (Signed)
PULMONARY / CRITICAL CARE MEDICINE   Name: [name] MRN: [id number] DOB: 1922-03-25    ADMISSION DATE:  10-11-15 CONSULTATION DATE:  2015/10/11  REFERRING MD:  Dr. Judd Lien, ER physician  CHIEF COMPLAINT:  Shortness of breath  HISTORY OF PRESENT ILLNESS:   82 year old female with a past medical history significant for atrial fibrillation, coronary artery disease and CHF presented to the emergency department on Oct 11, 2015 complaining of shortness of breath and leg swelling.  Her family states that she's been having increasing shortness of breath for approximately 2 months. This is been associated with increasing leg swelling. They have been in communication with her cardiologist for the last week and have been administering escalating doses of oral diuretic medicines at home. This included doubling the dose of Lasix and then administering metolazone. The family states that this may have made her urinate slightly more, but did not exhibit a change in shortness of breath or leg swelling. They also noted some cough for several days. Of note, she was treated for community-acquired pneumonia in March 2017. They also note that this morning she had a markedly abnormal appearing left leg with dramatic redness which had changed overnight. She had a chill this morning but no fever.  PAST MEDICAL HISTORY :  She  has a past medical history of Atrial fibrillation (HCC); Hyperlipidemia; Myocardial infarction (HCC); Hyperlipidemia; Osteoarthritis; ICD (implantable cardiac defibrillator) in place; LBBB (left bundle branch block); Ventricular tachycardia (HCC); Cardiomyopathy; Glaucoma; History of uterine cancer; History of total hysterectomy; Coronary artery disease; Hypertension; and Hypoxia (12/12).  PAST SURGICAL HISTORY: She  has past surgical history that includes Coronary artery bypass graft (1995); Cataract extraction; Cholecystectomy; Coronary angioplasty; Hernia repair; Coronary angioplasty with  stent; Cardiac defibrillator placement; Cardiac defibrillator placement; Abdominal hysterectomy (1989); doppler echocardiography (2008); and bi-ventricular pacemaker insertion (N/A, 07/23/2011).  No Known Allergies  No current facility-administered medications on file prior to encounter.   Current Outpatient Prescriptions on File Prior to Encounter  Medication Sig  . amiodarone (PACERONE) 200 MG tablet TAKE 1/2 TABLET EVERY DAY  . amoxicillin-clavulanate (AUGMENTIN) 875-125 MG tablet Take 1 tablet by mouth 2 (two) times daily. Take with food  . aspirin EC 81 MG tablet Take 81 mg by mouth daily.  . carvedilol (COREG) 6.25 MG tablet TAKE 1 TABLET TWICE DAILY  . clopidogrel (PLAVIX) 75 MG tablet TAKE 1 TABLET EVERY DAY  . furosemide (LASIX) 40 MG tablet Take 2 tablets (80 mg total) by mouth daily.  . furosemide (LASIX) 40 MG tablet Take 1 tablet (40 mg total) by mouth 2 (two) times daily.  Marland Kitchen latanoprost (XALATAN) 0.005 % ophthalmic solution Place 1 drop into the left eye at bedtime.   Marland Kitchen levothyroxine (SYNTHROID, LEVOTHROID) 50 MCG tablet TAKE 1 TABLET THREE TIMES WEEKLY  . lisinopril (PRINIVIL,ZESTRIL) 10 MG tablet TAKE 1 TABLET EVERY DAY  . metolazone (ZAROXOLYN) 2.5 MG tablet Take one tablet by mouth 30 min prior to Furosemide dose on Mon and Fri  . rosuvastatin (CRESTOR) 10 MG tablet TAKE 1 TABLET AT BEDTIME    FAMILY HISTORY:  Her indicated that her mother is deceased.   SOCIAL HISTORY: She  reports that she quit smoking about 32 years ago. She does not have any smokeless tobacco history on file. She reports that she does not drink alcohol or use illicit drugs.  REVIEW OF SYSTEMS:   Gen: Denies fever, + chills, weight change, fatigue, night sweats HEENT: Denies blurred vision, double vision, hearing loss, tinnitus, sinus congestion, rhinorrhea, sore  throat, neck stiffness, dysphagia PULM: Per HPI CV: Denies chest pain, edema, orthopnea, paroxysmal nocturnal dyspnea, palpitations GI:  Denies abdominal pain, nausea, vomiting, diarrhea, hematochezia, melena, constipation, change in bowel habits GU: Denies dysuria, hematuria, polyuria, oliguria, urethral discharge Endocrine: Denies hot or cold intolerance, polyuria, polyphagia or appetite change Derm: + rash, denies dry skin, scaling or peeling skin change Heme: Denies easy bruising, bleeding, bleeding gums Neuro: Denies headache, numbness, weakness, slurred speech, loss of memory or consciousness   SUBJECTIVE:  As above  VITAL SIGNS: BP 102/91 mmHg  Pulse 70  Temp(Src) 98.7 F (37.1 C) (Oral)  Resp 24  Ht 5\' 3"  (1.6 m)  Wt 113.399 kg (250 lb)  BMI 44.30 kg/m2  SpO2 95%  HEMODYNAMICS:    VENTILATOR SETTINGS: Vent Mode:  [-]  FiO2 (%):  [40 %] 40 %  INTAKE / OUTPUT:    PHYSICAL EXAMINATION: General:  Elderly female on BiPAP, complaining of pain from the bed Neuro:  Awake, alert, confused, moves all 4 extremities HEENT:  Normocephalic atraumatic, BiPAP mask is in place Cardiovascular:  Irregularly irregular, no clear murmurs gallops or rubs, difficult to assess JVD Lungs:  Crackles in bases bilaterally, normal effort, no wheezing Abdomen:  Bowel sounds positive, nontender nondistended Musculoskeletal:  Normal bulk and tone Skin:  Significant redness of the skin on the lateral aspect of her left lower leg and ankle  LABS:  BMET  Recent Labs Lab 10/07/2015 0620 10/13/2015 0656  NA 136 133*  K 4.9 5.1  CL 93* 94*  CO2 28  --   BUN 44* 59*  CREATININE 2.80* 2.40*  GLUCOSE 143* 135*    Electrolytes  Recent Labs Lab 10/10/2015 0620  CALCIUM 8.9    CBC  Recent Labs Lab 10/05/2015 0620 10/10/2015 0656  WBC 8.8  --   HGB 15.5* 18.7*  HCT 53.0* 55.0*  PLT 139*  --     Coag's No results for input(s): APTT, INR in the last 168 hours.  Sepsis Markers  Recent Labs Lab 10/07/2015 0657  LATICACIDVEN 2.36*    ABG  Recent Labs Lab 10/06/2015 0650 10/10/2015 0819  PHART 7.283* 7.303*   PCO2ART 68.5* 68.1*  PO2ART 86.0 95.0    Liver Enzymes  Recent Labs Lab 10/21/2015 0620  AST 26  ALT 17  ALKPHOS 50  BILITOT 2.8*  ALBUMIN 3.4*    Cardiac Enzymes  Recent Labs Lab 10/27/2015 0620  TROPONINI 0.20*    Glucose No results for input(s): GLUCAP in the last 168 hours.  Imaging Dg Chest Portable 1 View  10/10/2015  CLINICAL DATA:  Shortness of breath for a week, worse today. EXAM: PORTABLE CHEST 1 VIEW COMPARISON:  08/06/2015 FINDINGS: Postoperative changes in the mediastinum. Cardiac pacemaker. Cardiac enlargement. No vascular congestion. Bilateral basilar infiltration or atelectasis with small bilateral pleural effusions. Changes are progressing since previous study. No pneumothorax. Calcified and tortuous aorta. IMPRESSION: Progressing changes of infiltration or atelectasis in the lung bases and small bilateral pleural effusions. Electronically Signed   By: Burman NievesWilliam  Stevens M.D.   On: 10/07/2015 06:46     STUDIES:  None  CULTURES: 10/14/2015  ANTIBIOTICS: May 4 vancomycin May 4 cefepime May 4 Zosyn 1  SIGNIFICANT EVENTS: 10/06/2015 admission  LINES/TUBES: 10/14/2015 BiPAP  DISCUSSION: This is a pleasant [age] year old female with acute respiratory failure with hypoxemia secondary to CHF. The differential diagnosis includes pneumonia considering the abnormal chest x-ray and a recent cough, but the preponderance of her symptoms support a diagnosis of CHF. Specifically, she's  been having increasing leg swelling coincided with her dyspnea. She also appears to have cellulitis of her left leg. She has an elevated lactic acid which is due primarily to her respiratory failure from CHF.  ASSESSMENT / PLAN:  PULMONARY A: Acute respiratory failure with hypoxemia Apparent baseline hypoxemia, has refused oxygen in the past Likely chronic hypercapnic respiratory failure based on ABG, uncertain cause P:   Continue BiPAP as needed for now, suspect we will be able  to come off of this when she makes it to the stepdown unit Admit to stepdown Monitor O2 saturation, administer oxygen to maintain above 90% Diuresis  CARDIOVASCULAR A:  Acute systolic heart failure exacerbation, baseline LVEF 15-20% based on 2008 echocardiogram Pacemaker in place History of ventricular tachycardia Atrial fibrillation P:  Diuresis as above Continue Coreg Continue Plavix Continue amiodarone Hold lisinopril for now  RENAL A:   Acute on chronic kidney injury, do to low flow state? P:   Continue diuretic medicines as she is volume overloaded Monitor BMET and UOP Replace electrolytes as needed   GASTROINTESTINAL A:   No acute issues P:   Cardiac diet  HEMATOLOGIC A:   No acute issues P:  Monitor for bleeding  INFECTIOUS A:   Cellulitis left leg P:   Vancomycin Change zosyn the cefepime as this contains less sodium Follow blood culture  ENDOCRINE A:   Hypothyroidism   P:   Continue Synthroid  NEUROLOGIC A:   Likely baseline dementia Mild back pain P:   Avoid narcotics considering age Reposition as able   Code status DNR  FAMILY  - Updates: family at bedside  - Inter-disciplinary family meet or Palliative Care meeting due by:   7  Will admit and ask TRH to assume care tomorrow  My cc time 60 minutes  Heber [place], MD [facility] Pager: [phone] Cell: [phone] After 3pm or if no response, call [phone]   10/12/2015, 10:18 AM

## 2015-10-03 ENCOUNTER — Inpatient Hospital Stay (HOSPITAL_COMMUNITY): Payer: Medicare Other

## 2015-10-03 ENCOUNTER — Telehealth: Payer: Self-pay | Admitting: Family Medicine

## 2015-10-03 DIAGNOSIS — N183 Chronic kidney disease, stage 3 (moderate): Secondary | ICD-10-CM

## 2015-10-03 DIAGNOSIS — N19 Unspecified kidney failure: Secondary | ICD-10-CM

## 2015-10-03 DIAGNOSIS — Z7189 Other specified counseling: Secondary | ICD-10-CM | POA: Insufficient documentation

## 2015-10-03 DIAGNOSIS — I1310 Hypertensive heart and chronic kidney disease without heart failure, with stage 1 through stage 4 chronic kidney disease, or unspecified chronic kidney disease: Secondary | ICD-10-CM

## 2015-10-03 DIAGNOSIS — J9602 Acute respiratory failure with hypercapnia: Secondary | ICD-10-CM

## 2015-10-03 DIAGNOSIS — N179 Acute kidney failure, unspecified: Secondary | ICD-10-CM | POA: Diagnosis present

## 2015-10-03 DIAGNOSIS — L03116 Cellulitis of left lower limb: Secondary | ICD-10-CM

## 2015-10-03 DIAGNOSIS — I9589 Other hypotension: Secondary | ICD-10-CM

## 2015-10-03 DIAGNOSIS — I959 Hypotension, unspecified: Secondary | ICD-10-CM | POA: Diagnosis present

## 2015-10-03 DIAGNOSIS — D696 Thrombocytopenia, unspecified: Secondary | ICD-10-CM | POA: Diagnosis present

## 2015-10-03 DIAGNOSIS — I5043 Acute on chronic combined systolic (congestive) and diastolic (congestive) heart failure: Secondary | ICD-10-CM | POA: Diagnosis present

## 2015-10-03 DIAGNOSIS — Z515 Encounter for palliative care: Secondary | ICD-10-CM | POA: Insufficient documentation

## 2015-10-03 DIAGNOSIS — I509 Heart failure, unspecified: Secondary | ICD-10-CM

## 2015-10-03 LAB — CBC
HCT: 42.6 % (ref 36.0–46.0)
HEMOGLOBIN: 12.2 g/dL (ref 12.0–15.0)
MCH: 28.3 pg (ref 26.0–34.0)
MCHC: 28.6 g/dL — AB (ref 30.0–36.0)
MCV: 98.8 fL (ref 78.0–100.0)
Platelets: 83 10*3/uL — ABNORMAL LOW (ref 150–400)
RBC: 4.31 MIL/uL (ref 3.87–5.11)
RDW: 15.8 % — ABNORMAL HIGH (ref 11.5–15.5)
WBC: 6.5 10*3/uL (ref 4.0–10.5)

## 2015-10-03 LAB — ECHOCARDIOGRAM COMPLETE
HEIGHTINCHES: 63 in
Weight: 3808 oz

## 2015-10-03 LAB — BASIC METABOLIC PANEL
Anion gap: 14 (ref 5–15)
BUN: 49 mg/dL — AB (ref 6–20)
CHLORIDE: 96 mmol/L — AB (ref 101–111)
CO2: 28 mmol/L (ref 22–32)
CREATININE: 2.85 mg/dL — AB (ref 0.44–1.00)
Calcium: 8.3 mg/dL — ABNORMAL LOW (ref 8.9–10.3)
GFR calc Af Amer: 16 mL/min — ABNORMAL LOW (ref >60)
GFR calc non Af Amer: 13 mL/min — ABNORMAL LOW (ref >60)
GLUCOSE: 101 mg/dL — AB (ref 65–99)
Potassium: 4.4 mmol/L (ref 3.5–5.1)
SODIUM: 138 mmol/L (ref 135–145)

## 2015-10-03 LAB — MRSA PCR SCREENING: MRSA by PCR: NEGATIVE

## 2015-10-03 MED ORDER — CLOPIDOGREL BISULFATE 75 MG PO TABS
75.0000 mg | ORAL_TABLET | Freq: Every day | ORAL | Status: DC
  Administered 2015-10-03: 75 mg via ORAL
  Filled 2015-10-03 (×2): qty 1

## 2015-10-03 MED ORDER — MORPHINE SULFATE (CONCENTRATE) 10 MG/0.5ML PO SOLN
5.0000 mg | ORAL | Status: DC | PRN
  Administered 2015-10-04 (×2): 5 mg via ORAL
  Filled 2015-10-03 (×2): qty 0.5

## 2015-10-03 MED ORDER — LORAZEPAM 2 MG/ML PO CONC: 2.0000 mg | ORAL | Status: DC | PRN

## 2015-10-03 NOTE — Telephone Encounter (Signed)
Stephanie HaleMary beth @ hospice called stating pt is being release from [place] into hospice @ home.  She wanted to know if you would be her pcp through hospice for home care

## 2015-10-03 NOTE — Progress Notes (Signed)
Discussed in details with pts son and granddaughter at bedside with palliative care involved.  Family undecided about disposition yet but wish for full comfort measures. Want to continue antibiotics and home meds for now. Added morphine solution for comfort. Transfer to medical floor. Family to decide on options for residential vs home hospice over next 24-48 hours.

## 2015-10-03 NOTE — Progress Notes (Signed)
PROGRESS NOTE                                                                                                                                                                                                             Patient Demographics:    [name], is a 82 y.o. female, DOB - 02/03/1922, ONG:[id number]  Admit date - 10/15/2015   Admitting Physician Lupita Leash, MD  Outpatient Primary MD for the patient is Roxy Manns, MD  LOS - 1  Outpatient Specialists: Sharlot Gowda taylor: cardiology   Chief Complaint  Patient presents with  . Respiratory Distress       Brief Narrative   82 year old female with history of A. fib not on anticoagulation, CAD with history of MI, systolic CHF with EF of 15-20% status post ICD, chronic kidney disease stage III, hypertension was brought to the ED on 10/14/2015 with progressive shortness of breath and bilateral leg swelling. As per family patient having increased shortness of breath for about 2 months with leg swellings. Her PCP doubled her dose of Lasix and also added metolazone which did not improve her symptoms. Patient also having cough for several days. (Was treated for CAP in March). Family also noticed increased left leg redness on the day prior to admission. On presentation she was found to be in acute hypercapnic respiratory failure secondary to CHF exacerbation and possible pneumonia. She was also found to have from cytopenia and acute on chronic kidney disease stage III. Patient admitted to stepdown under ICU team on BiPAP. She was diuresed with Lasix and transferred to hospitalist service on 5/5. However patient still dyspneic and significantly hypotensive.     Subjective:   Patient hypotensive with systolic blood pressure in the 50s-60s, MAP improved to greater than 60 this morning after all medications discontinued and 50 mL IV normal saline bolus given.   Assessment  &  Plan :   Principal problem Acute combined hypoxemic and hypercapnic respiratory failure Likely primarily due to acute systolic CHF exacerbation. Ordered repeat 2-D echo. Showed some improvement with IV Lasix and BiPAP on admission however patient still dyspneic requiring up to 5 L via nasal cannula and further diuresis limited due to low blood pressure. Patient required some IV normal saline bolus this morning. -Hold off on further Lasix and other home medications. Has poor urine output  overnight. -Continue Plavix and statin.  -Overall prognosis is guarded.  Left leg cellulitis On empiric vancomycin and cefepime. Keep leg elevated.  Acute systolic CHF EF of 15-20% as per echo in 2008. Check repeat echo. Unable to diabetes given her hypertension. Consult cardiology as needed.  Hypotension Patient likely has low blood pressure at baseline given her severe CHF, currently further worsened. Holding all blood pressure medications. We'll try small fluid boluses if able to tolerate. Hold off on narcotics on this absolutely necessary.  Acute on chronic kidney disease stage III Possibly cardiorenal. Unable to diurese patient at this time. Monitor closely.  Elevated troponin Possibly demand ischemia due to underlying CHF. Patient denies chest pain symptoms  CAD Continue aspirin, Plavix and statin.  A. fib Not on anticoagulation. Continue amiodarone. Hold beta blocker  Hypothyroidism Continue Synthroid.  thrombocytopenia Possibly due to acute illness. Discontinue Lovenox. Monitor closely.  Goals of care Overall prognosis is poor. Given her severe cardiomyopathy with active respiratory failure and hypotension, treatment options are limited. Spoke at length with granddaughter at bedside and she understands patient's grave prognosis. She agrees with current management and wishes for no aggressive measures or escalation of care. She agrees that if patient declines she would want her to be  comfortable and she is stable during the hospital course would like her to be discharged home with home hospice.          Code Status : DO NOT RESUSCITATE  Family Communication  : Granddaughter at bedside  Disposition Plan  : Continue stepdown monitoring  Barriers For Discharge :  Active respiratory failure/hypotension  Consults  :   PC CM  Procedures  : 2-D echo ordered  DVT Prophylaxis  :  SCDs, (Thrombocytopenia)  Lab Results  Component Value Date   PLT 83* 10/03/2015    Antibiotics  :    Anti-infectives    Start     Dose/Rate Route Frequency Ordered Stop   10/04/15 1000  vancomycin (VANCOCIN) 1,500 mg in sodium chloride 0.9 % 500 mL IVPB     1,500 mg 250 mL/hr over 120 Minutes Intravenous Every 48 hours 10/14/2015 0815     10/01/2015 1600  piperacillin-tazobactam (ZOSYN) IVPB 2.25 g  Status:  Discontinued     2.25 g 100 mL/hr over 30 Minutes Intravenous Every 6 hours 10/10/2015 0815 10/14/2015 1204   10/18/2015 1400  ceFEPIme (MAXIPIME) 1 g in dextrose 5 % 50 mL IVPB     1 g 100 mL/hr over 30 Minutes Intravenous Every 24 hours 10/01/2015 1205     10/17/2015 0815  piperacillin-tazobactam (ZOSYN) IVPB 3.375 g     3.375 g 100 mL/hr over 30 Minutes Intravenous  Once 10/17/2015 0803 10/01/2015 0852   10/07/2015 0815  vancomycin (VANCOCIN) IVPB 1000 mg/200 mL premix  Status:  Discontinued     1,000 mg 200 mL/hr over 60 Minutes Intravenous  Once 10/27/2015 0803 10/25/2015 0805   10/12/2015 0815  vancomycin (VANCOCIN) 2,000 mg in sodium chloride 0.9 % 500 mL IVPB     2,000 mg 250 mL/hr over 120 Minutes Intravenous  Once 10/18/2015 0805 10/12/2015 1232        Objective:   Filed Vitals:   10/03/15 0353 10/03/15 0400 10/03/15 0500 10/03/15 0810  BP: 78/35 79/48 65/40  72/56  Pulse: 75 73 73 73  Temp: 97 F (36.1 C)   97.6 F (36.4 C)  TempSrc: Axillary   Oral  Resp: 25 19 19 21   Height:      Weight:  107.956 kg (238 lb)     SpO2: 94% 96% 93% 93%    Wt Readings from Last 3 Encounters:   10/03/15 107.956 kg (238 lb)  08/26/15 105.688 kg (233 lb)  08/06/15 100.585 kg (221 lb 12 oz)     Intake/Output Summary (Last 24 hours) at 10/03/15 1046 Last data filed at 10/25/15 2352  Gross per 24 hour  Intake    350 ml  Output    850 ml  Net   -500 ml     Physical Exam  Gen: Elderly female appears fatigued, not in distress HEENT: no pallor, moist mucosa, JVD +, supple neck Chest: Diminished bilateral breath sounds, no added sounds CVS: S1 and S2 irregularly irregular, no murmurs rub or gallop GI: soft, NT, ND, BS+, Foley catheter + Musculoskeletal: Erythema over left leg, 1+ pitting edema bilaterally CNS: Alert and awake, nonfocal    Data Review:    CBC  Recent Labs Lab 10-25-15 0620 Oct 25, 2015 0656 25-Oct-2015 1549 10/03/15 0330  WBC 8.8  --  5.6 6.5  HGB 15.5* 18.7* 13.5 12.2  HCT 53.0* 55.0* 45.3 42.6  PLT 139*  --  85* 83*  MCV 96.9  --  98.5 98.8  MCH 28.3  --  29.3 28.3  MCHC 29.2*  --  29.8* 28.6*  RDW 15.3  --  15.5 15.8*  LYMPHSABS 1.3  --   --   --   MONOABS 1.1*  --   --   --   EOSABS 0.0  --   --   --   BASOSABS 0.0  --   --   --     Chemistries   Recent Labs Lab 25-Oct-2015 0620 10-25-15 0656 25-Oct-2015 1549 10/03/15 0330  NA 136 133*  --  138  K 4.9 5.1  --  4.4  CL 93* 94*  --  96*  CO2 28  --   --  28  GLUCOSE 143* 135*  --  101*  BUN 44* 59*  --  49*  CREATININE 2.80* 2.40* 2.86* 2.85*  CALCIUM 8.9  --   --  8.3*  AST 26  --   --   --   ALT 17  --   --   --   ALKPHOS 50  --   --   --   BILITOT 2.8*  --   --   --    ------------------------------------------------------------------------------------------------------------------ No results for input(s): CHOL, HDL, LDLCALC, TRIG, CHOLHDL, LDLDIRECT in the last 72 hours.  No results found for: HGBA1C ------------------------------------------------------------------------------------------------------------------ No results for input(s): TSH, T4TOTAL, T3FREE, THYROIDAB in the  last 72 hours.  Invalid input(s): FREET3 ------------------------------------------------------------------------------------------------------------------ No results for input(s): VITAMINB12, FOLATE, FERRITIN, TIBC, IRON, RETICCTPCT in the last 72 hours.  Coagulation profile No results for input(s): INR, PROTIME in the last 168 hours.  No results for input(s): DDIMER in the last 72 hours.  Cardiac Enzymes  Recent Labs Lab 2015/10/25 0620  TROPONINI 0.20*   ------------------------------------------------------------------------------------------------------------------    Component Value Date/Time   BNP 1179.0* 2015/10/25 0620    Inpatient Medications  Scheduled Meds: . amiodarone  100 mg Oral Daily  . antiseptic oral rinse  7 mL Mouth Rinse BID  . aspirin EC  81 mg Oral Daily  . carvedilol  6.25 mg Oral BID WC  . ceFEPime (MAXIPIME) IV  1 g Intravenous Q24H  . clopidogrel  75 mg Oral QHS  . enoxaparin (LOVENOX) injection  30 mg Subcutaneous Q24H  . latanoprost  1 drop  Left Eye QHS  . levothyroxine  50 mcg Oral QAC breakfast  . rosuvastatin  10 mg Oral QHS  . sodium chloride  250 mL Intravenous Once  . [START ON 10/04/2015] vancomycin  1,500 mg Intravenous Q48H   Continuous Infusions: . nitroGLYCERIN Stopped (01-29-16 0829)   PRN Meds:.sodium chloride, acetaminophen, ondansetron (ZOFRAN) IV, oxyCODONE  Micro Results No results found for this or any previous visit (from the past 240 hour(s)).  Radiology Reports Dg Chest Port 1 View  10/03/2015  CLINICAL DATA:  Respiratory failure. EXAM: PORTABLE CHEST 1 VIEW COMPARISON:  2015/11/20. FINDINGS: AICD in stable position. Prior CABG. Cardiomegaly with mild pulmonary vascular prominence. Bilateral interstitial prominence and bilateral pleural effusions. Findings consistent congestive heart failure. Low lung volumes with bibasilar atelectasis. Bibasilar pneumonia cannot be excluded. Similar findings noted on prior study. No  pneumothorax. IMPRESSION: 1. AICD in stable position.  Prior CABG. 2. Cardiomegaly with mild pulmonary vascular prominence. Bilateral interstitial prominence and bilateral pleural effusions. Findings consistent congestive heart failure. No change from prior exam. 3. Low lung volumes with bibasilar atelectasis. Bibasilar pneumonia cannot be excluded. Similar findings noted on prior exam . Electronically Signed   By: Maisie Fushomas  Register   On: 10/03/2015 07:17   Dg Chest Portable 1 View  10/14/2015  CLINICAL DATA:  Shortness of breath for a week, worse today. EXAM: PORTABLE CHEST 1 VIEW COMPARISON:  08/06/2015 FINDINGS: Postoperative changes in the mediastinum. Cardiac pacemaker. Cardiac enlargement. No vascular congestion. Bilateral basilar infiltration or atelectasis with small bilateral pleural effusions. Changes are progressing since previous study. No pneumothorax. Calcified and tortuous aorta. IMPRESSION: Progressing changes of infiltration or atelectasis in the lung bases and small bilateral pleural effusions. Electronically Signed   By: Burman NievesWilliam  Stevens M.D.   On: 2015/11/20 06:46    Time Spent in minutes  35   Eddie NorthHUNGEL, Kaylah Chiasson M.D on 10/03/2015 at 10:46 AM  Between 7am to 7pm - Pager - [phone]  After 7pm go to [email or web address] - password [email or web address]  [facility] -  Office  [phone]

## 2015-10-03 NOTE — Telephone Encounter (Signed)
Yes-thanks, that is fine

## 2015-10-03 NOTE — Clinical Documentation Improvement (Signed)
Internal Medicine   Can a diagnosis for the  BMI 44.30 kg  be provided, if appropriate for this admission ?  Thank you    Identify Type - morbid obesity, obesity (link the BMI to condition e.g. morbid obesity with BMI of 47), overweight, with alveolar hypoventilation (Pickwickian Syndrome)  Document etiology of - drug induced (specify drug), due to excess calories, familial, endocrine  Other  Clinically Undetermined   Please exercise your independent, professional judgment when responding. A specific answer is not anticipated or expected.   Thank You,  Lavonda JumboLawanda J Chung [place] [facility] [place] [phone]

## 2015-10-03 NOTE — Progress Notes (Signed)
Dhungel MD notified BP 61/42. Patient still oriented but sleepy. 20 urine output since this AM. Patient is mouth breather, sats dropping to low 80s on 5LNC, venti-mask at 12L applied in order to keep sats greater than 90. Discussed with family (son-sheldon, granddaughter-April) MD conversation from this AM regarding patient condition and plan of care. Family indicates they may want to speak with palliative before tomorrow. Will continue to monitor closely.

## 2015-10-03 NOTE — Care Management Note (Addendum)
Case Management Note  Patient Details  Name: Stephanie PrimusSarah N Dant MRN: [id number] Date of Birth: 02-Jan-1922  Subjective/Objective:     Pt is living with dtr, family's choice is to take pt home with hospice following, offered choice and [facility] .  Referral called and documents faxed.                       Expected Discharge Plan:  Home w Hospice Care  Discharge planning Services  CM Consult  Choice offered to:  Adult Children  DME Arranged:  Hospital bed, Oxygen DME Agency:  [facility]  HH Arranged:  RN, Nurse's Aide, Social Work [facility]  Status of Service:  Completed, signed off  [place], Dahl[place] CenterHenrietta T, [place]RN 10/03/2015, 4:04 PM  10/03/15 FAMILY HAS NOW DECIDED THAT THEY DO NOT WANT PT DISCHARGED HOME THIS EVENING, THEY WANT MORE TIME TO PREPARE.  ATTENDING AND PALLIATIVE CARE PHYSICIAN AGREE AS PT IS VERY HYPOTENSIVE AND HYPOXEMIC.

## 2015-10-03 NOTE — Progress Notes (Signed)
Echocardiogram 2D Echocardiogram has been performed.  Dorothey BasemanReel, Shoshannah Faubert M 10/03/2015, 3:30 PM

## 2015-10-03 NOTE — Telephone Encounter (Signed)
Hospice notified Dr. Milinda Antisower will be the PCP

## 2015-10-03 NOTE — Consult Note (Signed)
Consultation Note Date: 10/03/2015   Patient Name: [name]  DOB: 1922/05/16  MRN: [id number]  Age / Sex: 82 y.o., female  PCP: Abner Greenspan, MD Referring Physician: Louellen Molder, MD  Reason for Consultation: Terminal Care  HPI/Patient Profile: 82 y.o. female  with past medical history of CHF, A fib  admitted on 10/16/2015 with  Acute on chronic CHF exacerbation, cardiorenal syndrome.   Clinical Assessment and Goals of Care:  82 year old female with history of A. fib not on anticoagulation, CAD with history of MI, systolic CHF with EF of 40-98% status post ICD, chronic kidney disease stage III, hypertension was brought to the ED on 10/03/2015 with progressive shortness of breath and bilateral leg swelling. As per family patient having increased shortness of breath for about 2 months with leg swellings. Her PCP doubled her dose of Lasix and also added metolazone which did not improve her symptoms. Patient also having cough for several days. (Was treated for CAP in March). Family also noticed increased left leg redness on the day prior to admission. On presentation she was found to be in acute hypercapnic respiratory failure secondary to CHF exacerbation and possible pneumonia. She was also found to have from cytopenia and acute on chronic kidney disease stage III. Patient admitted to stepdown under ICU team on BiPAP. She was diuresed with Lasix and transferred to hospitalist service on 5/5. However patient still dyspneic and significantly hypotensive.  Hence, palliative care consulted emergently for additional goals of care discussions.   Met with the patient, her son HCPOA agent, grand daughter, nieces, minister and several other family members in the room. I introduced palliative care as follows Palliative medicine is specialized medical care for people living with serious illness. It focuses on providing  relief from the symptoms and stress of a serious illness. The goal is to improve quality of life for both the patient and the family.  Discussed patient's acute life limiting illnesses, she is acutely hypotensive right now. She has high O2 requirements, she is on VM. She was not on home O2. She is described by family as an independent person, she was living by herself up until the past few weeks.   Discussed end of life signs and symptoms that the patient is displaying in great detail. The patient is able to open her eyes and is able to answer a few questions appropriately. She denies being in pain, she does appear to be using accessory muscles of respirations even though she denies being dyspneic.   Prognosis hours to some very limited number of days discussed in detail. Patient is acutely ill and possibly imminently dying. Family is asking for [place] to make STAT arrangements to get the patient home with hospice to her daughter's home. Discussed about arranging for transportation, recognizing that there is a risk for the patient passing away in the ambulance, arranging for STAT hosp[facility] consult, family has experience with [facility], they will need STAT arrangements for delivery of durable medical equipment,  emergency hospice medications kit and I will recommend continuous care to be set up through [facility].   I have offered and discussed extensively with family about considering staying in the hospital, starting comfort measures on 6N. Also discussed nearby Hospice agencies for choice discussions, such as hospice of [place], [place]. Family prefers STAT D/C home with hospice through [facility], this has been discussed with Dr Clementeen Graham and also with Ms Mayo from care management.    HCPOA  son Mr Joyel Chenette.   SUMMARY OF RECOMMENDATIONS    DNR DNI Family is asking for STAT D/C home with hospice services through [facility], they live in [place], [place] Comfort measures, O2 Prognosis hours to days discussed with family.   Code Status/Advance Care Planning:  DNR    Symptom Management:    Monitor for opioid needs and other comfort care needs.  Palliative Prophylaxis:   Delirium Protocol  Additional Recommendations (Limitations, Scope, Preferences):  Full Comfort Care  Psycho-social/Spiritual:   Desire for further Chaplaincy support:yes  Additional Recommendations: Education on Hospice  Prognosis:   Hours - Days  Discharge Planning: Home with Hospice      Primary Diagnoses: Present on Admission:  . Atrial fibrillation ([place]) . Acute renal failure superimposed on stage 3 chronic kidney disease ([place]) . Cardiorenal syndrome with renal failure . Acute on chronic combined systolic and diastolic heart failure ([place]) . Thrombocytopenia ([place]) . Cellulitis of leg, left . Hypotension  I have reviewed the medical record, interviewed the patient and family, and examined the patient. The following aspects are pertinent.  Past Medical History  Diagnosis Date  . Atrial fibrillation ([place])   . Hyperlipidemia   . Myocardial infarction ([place])   . Hyperlipidemia   . Osteoarthritis   . ICD (implantable cardiac defibrillator) in place     St. Jude  . LBBB (left bundle branch block)   . Ventricular tachycardia ([place])   . Cardiomyopathy   . Glaucoma   . History of uterine cancer   . History of total hysterectomy   . Coronary artery disease     hx of MI  . Hypertension   . Hypoxia 12/12    suspect copd but pt refuses any pulmonary work up or treatment  . CHF (congestive heart failure) ([place])   . Heart murmur   . Shortness of breath dyspnea    Social History   Social History  . Marital Status: Widowed    Spouse Name: N/A  . Number of Children: 1  . Years of Education: N/A   Social History Main Topics  . Smoking status: Former Smoker    Quit date: 10/20/1982  . Smokeless  tobacco: Never Used  . Alcohol Use: No  . Drug Use: No  . Sexual Activity: Not Asked   Other Topics Concern  . None   Social History Narrative   Family History  Problem Relation Age of Onset  . Heart attack Father   . Heart disease Father     MI  . Diverticulitis Mother   . Ovarian cancer Sister   . Cancer Sister     ovarian CA   Scheduled Meds: . amiodarone  100 mg Oral Daily  . antiseptic oral rinse  7 mL Mouth Rinse BID  . aspirin EC  81 mg Oral Daily  . ceFEPime (MAXIPIME) IV  1 g Intravenous Q24H  . clopidogrel  75 mg Oral QHS  . latanoprost  1 drop Left Eye  QHS  . levothyroxine  50 mcg Oral QAC breakfast  . rosuvastatin  10 mg Oral QHS  . sodium chloride  250 mL Intravenous Once  . [START ON 10/04/2015] vancomycin  1,500 mg Intravenous Q48H   Continuous Infusions:  PRN Meds:.sodium chloride, acetaminophen, ondansetron (ZOFRAN) IV, oxyCODONE Medications Prior to Admission:  Prior to Admission medications   Medication Sig Start Date End Date Taking? Authorizing Provider  amiodarone (PACERONE) 200 MG tablet TAKE 1/2 TABLET EVERY DAY 12/20/14  Yes Evans Lance, MD  aspirin EC 81 MG tablet Take 81 mg by mouth daily.   Yes Historical Provider, MD  carvedilol (COREG) 6.25 MG tablet TAKE 1 TABLET TWICE DAILY 12/20/14  Yes Evans Lance, MD  clopidogrel (PLAVIX) 75 MG tablet TAKE 1 TABLET EVERY DAY 12/20/14  Yes Evans Lance, MD  furosemide (LASIX) 40 MG tablet Take 2 tablets (80 mg total) by mouth daily. 08/26/15  Yes [place], MD  latanoprost (XALATAN) 0.005 % ophthalmic solution Place 1 drop into the left eye at bedtime.    Yes Historical Provider, MD  levothyroxine (SYNTHROID, LEVOTHROID) 50 MCG tablet TAKE 1 TABLET THREE TIMES WEEKLY Patient taking differently: TAKE 1 TABLET THREE TIMES WEEKLY (MWF) 12/20/14  Yes Evans Lance, MD  lisinopril (PRINIVIL,ZESTRIL) 10 MG tablet TAKE 1 TABLET EVERY DAY 12/20/14  Yes Evans Lance, MD  metolazone (ZAROXOLYN) 2.5 MG  tablet Take one tablet by mouth 30 min prior to Furosemide dose on Mon and Fri 09/30/15  Yes Evans Lance, MD  Multiple Vitamins-Minerals (CENTRUM SILVER ADULT 50+ PO) Take 1 tablet by mouth daily.   Yes Historical Provider, MD  rosuvastatin (CRESTOR) 10 MG tablet TAKE 1 TABLET AT BEDTIME 12/20/14  Yes Evans Lance, MD   No Known Allergies Review of Systems Denies pain or dyspnea currently.   Physical Exam Weak lady, opens eyes, is able to respond appropriately S1 S2 Diminished breath sounds Patient on venturi mask Mottling on both UE and LE, no coolness, + for edema Abdomen distended  Vital Signs: BP 61/42 mmHg  Pulse 71  Temp(Src) 96.2 F (35.7 C) (Oral)  Resp 16  Ht _0  (1.6 m)  Wt 107.956 kg (238 lb)  BMI 42.17 kg/m2  SpO2 91% Pain Assessment: No/denies pain   Pain Score: 0-No pain   SpO2: SpO2: 91 % O2 Device:SpO2: 91 % O2 Flow Rate: .O2 Flow Rate (L/min): 12 L/min  IO: Intake/output summary:  Intake/Output Summary (Last 24 hours) at 10/03/15 1526 Last data filed at 10/09/2015 2352  Gross per 24 hour  Intake    300 ml  Output    575 ml  Net   -275 ml    LBM: Last BM Date: 10/03/15 Baseline Weight: Weight: 113.399 kg (250 lb) Most recent weight: Weight: 107.956 kg (238 lb)     Palliative Assessment/Data:   Flowsheet Rows        Most Recent Value   Intake Tab    Referral Department  Hospitalist   Unit at Time of Referral  Intermediate Care Unit   Palliative Care Primary Diagnosis  Cardiac   Date Notified  10/03/15   Palliative Care Type  New Palliative care   Reason for referral  End of Life Care Assistance, Counsel Regarding Hospice   Date of Admission  10/20/2015   Date first seen by Palliative Care  10/03/15   # of days IP prior to Palliative referral  1   Clinical Assessment  Palliative Performance Scale Score  20%   Pain Max last 24 hours  6   Pain Min Last 24 hours  5   Dyspnea Max Last 24 Hours  7   Dyspnea Min Last 24 hours  6    Psychosocial & Spiritual Assessment    Palliative Care Outcomes    Patient/Family meeting held?  Yes   Who was at the meeting?  son, grand daughter, nieces, Company secretary.    Palliative Care Outcomes  Counseled regarding hospice   Palliative Care follow-up planned  Yes, Home   Other Treatment Preference Instructions  family wants STAT home with hospice d/c       Time In: 1400 Time Out: 1530 Time Total: 90 min  Greater than 50%  of this time was spent counseling and coordinating care related to the above assessment and plan.  Signed by: Loistine Chance, MD  [phone] Please contact Palliative Medicine Team phone at [phone] for questions and concerns.  For individual provider: See Shea Evans

## 2015-10-04 DIAGNOSIS — Z515 Encounter for palliative care: Secondary | ICD-10-CM | POA: Insufficient documentation

## 2015-10-04 DIAGNOSIS — D696 Thrombocytopenia, unspecified: Secondary | ICD-10-CM

## 2015-10-04 DIAGNOSIS — J9601 Acute respiratory failure with hypoxia: Secondary | ICD-10-CM | POA: Insufficient documentation

## 2015-10-04 LAB — URINE CULTURE: Culture: >=100000 — AB

## 2015-10-04 MED ORDER — MORPHINE SULFATE (PF) 2 MG/ML IV SOLN: 1.0000 mg | Freq: Once | INTRAVENOUS | Status: DC

## 2015-10-04 MED ORDER — MORPHINE SULFATE (PF) 2 MG/ML IV SOLN: 1.0000 mg | INTRAVENOUS | Status: DC | PRN

## 2015-10-04 MED ORDER — HYDROMORPHONE HCL 1 MG/ML IJ SOLN
0.2500 mg | INTRAMUSCULAR | Status: DC | PRN
  Administered 2015-10-04 – 2015-10-05 (×3): 0.5 mg via INTRAVENOUS
  Administered 2015-10-05 (×3): 0.25 mg via INTRAVENOUS
  Administered 2015-10-05 – 2015-10-07 (×7): 0.5 mg via INTRAVENOUS
  Filled 2015-10-04 (×14): qty 1

## 2015-10-04 MED ORDER — LORAZEPAM 2 MG/ML PO CONC: 1.0000 mg | ORAL | Status: DC | PRN

## 2015-10-04 NOTE — Progress Notes (Signed)
Daily Progress Note   Patient Name: [name]       Date: 10/04/2015 DOB: 15-Dec-1921  Age: 82 y.o. MRN#: [id number] Attending Physician: Michael Litter, MD Primary Care Physician: Roxy Manns, MD Admit Date: Oct 27, 2015  Reason for Consultation/Follow-up: Non pain symptom management, Pain control and Psychosocial/spiritual support  Subjective: Patient has appeared to be in pain this morning. Paged by nurse at the bedside stating that medicines were ineffective and short acting to relieve her pain. When I saw patient she would open her eyes and speak briefly to me. She states her "legs feel terrible", and that she felt short of breath. She does exhibit increased work of breathing. She has multiple family members around her bedside. They are no longer looking for discharge home with hosp[facility] [place] but wish to remain in the hospital for what is anticipated to be end-of-life.  Length of Stay: 2  Current Medications: Scheduled Meds:  . amiodarone  100 mg Oral Daily  . antiseptic oral rinse  7 mL Mouth Rinse BID  . aspirin EC  81 mg Oral Daily  . ceFEPime (MAXIPIME) IV  1 g Intravenous Q24H  . clopidogrel  75 mg Oral QHS  . latanoprost  1 drop Left Eye QHS  . levothyroxine  50 mcg Oral QAC breakfast  .  morphine injection  1 mg Intravenous Once  . sodium chloride  250 mL Intravenous Once    Continuous Infusions:    PRN Meds: sodium chloride, acetaminophen, HYDROmorphone (DILAUDID) injection, LORazepam, ondansetron (ZOFRAN) IV  Physical Exam  Constitutional: She appears well-developed and well-nourished.  HENT:  Head: Normocephalic and atraumatic.  Pulmonary/Chest:  Increased work of breathing  Abdominal: She exhibits distension.  Musculoskeletal: She exhibits  tenderness.  Neurological:  Opens her eyes to voice; minimally conversant  Skin: Skin is warm.  Nursing note and vitals reviewed.           Vital Signs: BP 122/74 mmHg  Pulse 89  Temp(Src) 97.5 F (36.4 C) (Axillary)  Resp 19  Ht 5\' 3"  (1.6 m)  Wt 107.956 kg (238 lb)  BMI 42.17 kg/m2  SpO2 94% SpO2: SpO2: 94 % O2 Device: O2 Device: Venturi Mask O2 Flow Rate: O2 Flow Rate (L/min): 12 L/min  Intake/output summary:  Intake/Output Summary (Last 24 hours) at 10/04/15 1311  Last data filed at 10/04/15 0600  Gross per 24 hour  Intake    150 ml  Output    125 ml  Net     25 ml   LBM: Last BM Date: 10/03/15 Baseline Weight: Weight: 113.399 kg (250 lb) Most recent weight: Weight: 107.956 kg (238 lb)       Palliative Assessment/Data:    Flowsheet Rows        Most Recent Value   Intake Tab    Referral Department  Hospitalist   Unit at Time of Referral  Intermediate Care Unit   Palliative Care Primary Diagnosis  Cardiac   Date Notified  10/03/15   Palliative Care Type  New Palliative care   Reason for referral  End of Life Care Assistance, Counsel Regarding Hospice   Date of Admission  10/11/2015   Date first seen by Palliative Care  10/03/15   # of days IP prior to Palliative referral  1   Clinical Assessment    Palliative Performance Scale Score  20%   Pain Max last 24 hours  6   Pain Min Last 24 hours  5   Dyspnea Max Last 24 Hours  7   Dyspnea Min Last 24 hours  6   Psychosocial & Spiritual Assessment    Palliative Care Outcomes    Patient/Family meeting held?  Yes   Who was at the meeting?  son, grand daughter, nieces, Optician, dispensing.    Palliative Care Outcomes  Counseled regarding hospice   Palliative Care follow-up planned  Yes, Home   Other Treatment Preference Instructions  family wants STAT home with hospice d/c       Patient Active Problem List   Diagnosis Date Noted  . Acute renal failure superimposed on stage 3 chronic kidney disease (HCC) 10/03/2015  .  Cardiorenal syndrome with renal failure 10/03/2015  . Acute on chronic combined systolic and diastolic heart failure (HCC) 10/03/2015  . Thrombocytopenia (HCC) 10/03/2015  . Cellulitis of leg, left 10/03/2015  . Hypotension 10/03/2015  . Encounter for palliative care   . Goals of care, counseling/discussion   . CHF exacerbation (HCC) Oct 11, 2015  . CAP (community acquired pneumonia) 08/26/2015  . Hemoptysis 08/06/2015  . Abrasion 10/15/2013  . Skin lesion of back 10/15/2013  . Fall 10/15/2013  . Inflamed sebaceous cyst 04/11/2013  . Vertigo, benign positional 03/15/2012  . Sebaceous cyst of right axilla 03/15/2012  . Mechanical complication due to cardiac pacemaker (electrode) 03/10/2012  . Pacemaker-St.Jude 03/03/2012  . COPD (chronic obstructive pulmonary disease) (HCC) 01/28/2012  . Chronic low back pain 01/28/2012  . Chronic systolic heart failure (HCC) 10/20/2011  . Hypoxia 04/23/2011  . AAA 09/09/2009  . LBBB 06/05/2008  . Ventricular fibrillation (HCC) 06/05/2008  . HYPERLIPIDEMIA 01/03/2007  . GLAUCOMA 01/03/2007  . MYOCARDIAL INFARCTION, HX OF 01/03/2007  . CARDIOMYOPATHY, ISCHEMIC 01/03/2007  . Atrial fibrillation (HCC) 01/03/2007  . OSTEOARTHRITIS 01/03/2007  . UTERINE CANCER, HX OF 01/03/2007    Palliative Care Assessment & Plan   Patient Profile: Patient is a 82 year old female with history of atrial fibrillation not on anticoagulation, coronary artery disease with a history of MI, systolic heart failure with an EF of 15-20% status post ICD, chronic kidney disease stage III, hypertension who was brought to the emergency room on May 4 with progressive shortness of breath, bilateral leg swelling. She was found to be in acute hypercapnic respiratory failure secondary to CHF exacerbation, and possible pneumonia.  Assessment: Somnolent but will open her eyes  to voice. Exhibits increased work of breathing, complaining of leg pain  Recommendations/Plan:  Pain: We'll  DC morphine secondary to chronic kidney disease and increasing creatinine levels. As patient is not going home will rely on IV medications here as she is exhibiting difficulty taking by mouth medications today. Will start Dilaudid IV 0.25-0.5 mg every 2 when necessary  Dyspnea: Continue with O2 as tolerated, and will add opioids for increased work of breathing. Education provided to family on mechanism of action of opioids to treat dyspnea  Goals of Care and Additional Recommendations:  Limitations on Scope of Treatment: Full Comfort Care  Code Status:    Code Status Orders        Start     Ordered   10/26/2015 1507  Do not attempt resuscitation (DNR)   Continuous    Question Answer Comment  In the event of cardiac or respiratory ARREST Do not call a "code blue"   In the event of cardiac or respiratory ARREST Do not perform Intubation, CPR, defibrillation or ACLS   In the event of cardiac or respiratory ARREST Use medication by any route, position, wound care, and other measures to relive pain and suffering. May use oxygen, suction and manual treatment of airway obstruction as needed for comfort.      10/23/2015 1506    Code Status History    Date Active Date Inactive Code Status Order ID Comments User Context   10/22/2015  1:11 PM 10/22/2015  3:06 PM DNR [id number]  Lupita Leashouglas B McQuaid, MD ED   04/23/2011 10:59 PM 04/26/2011  7:28 PM Full Code [id number]  Mechele CollinShana Diandra Spencer, RN Inpatient    Advance Directive Documentation        Most Recent Value   Type of Advance Directive  Healthcare Power of Attorney   Pre-existing out of facility DNR order (yellow form or pink MOST form)     "MOST" Form in Place?         Prognosis:   < 2 weeks  Discharge Planning:  At this point anticipating a hospital death versus discharge [facility]  Care plan was discussed with Dr. Linna DarnerAnwar  Thank you for allowing the Palliative Medicine Team to assist in the care of this  patient.   Time In: 0900 Time Out: 0930 Total Time 25 min Prolonged Time Billed  no       Greater than 50%  of this time was spent counseling and coordinating care related to the above assessment and plan.  Irean HongSarah Grace Tovah Slavick, NP  Please contact Palliative Medicine Team phone at [phone] for questions and concerns.

## 2015-10-04 NOTE — Progress Notes (Signed)
PROGRESS NOTE                                                                                                                                                                                                             Patient Demographics:    [name], is a 82 y.o. female, DOB - 1922-03-09, ZOX:[id number]  Admit date - Oct 03, 2015   Admitting Physician Lupita Leash, MD  Outpatient Primary MD for the patient is Roxy Manns, MD  LOS - 2  Outpatient Specialists: Sharlot Gowda taylor: cardiology   Chief Complaint  Patient presents with  . Respiratory Distress       Brief Narrative   82 year old female with history of A. fib not on anticoagulation, CAD with history of MI, systolic CHF with EF of 15-20% status post ICD, chronic kidney disease stage III, hypertension and Morbid Obesity (BMI 44) was brought to the ED on 2015-10-03 with progressive shortness of breath and bilateral leg swelling. As per family patient having increased shortness of breath for about 2 months with leg swellings. Her PCP doubled her dose of Lasix and also added metolazone which did not improve her symptoms. Patient also having cough for several days. (Was treated for CAP in March). Family also noticed increased left leg redness on the day prior to admission. On presentation she was found to be in acute hypercapnic respiratory failure secondary to CHF exacerbation and possible pneumonia. She was also found to have thrombocytopenia and acute on chronic kidney disease stage III. Patient admitted to stepdown under ICU team on BiPAP. She was diuresed with Lasix and transferred to hospitalist service on 5/5. However patient still dyspneic and significantly hypotensive.    Subjective:   Palliative Care is following the patient.  We have transitioned to comfort measures only.  The family feels that she is actively dying and do not want transfer at this time.   The patient is minimally responsive for me; engages better with her family.  Sat on the edge of the bed for 20 minutes today.   Assessment  & Plan :   Principal problem Acute combined hypoxemic and hypercapnic respiratory failure Likely primarily due to acute systolic CHF exacerbation. Repeat 2-D echo this admission, EF 30-35%. Showed some improvement with IV Lasix and BiPAP on admission however patient still dyspneic requiring up to 5 L via  nasal cannula and further diuresis limited due to low blood pressure. Patient required some IV normal saline bolus this morning. -Not actively diuresing due to relative hypotension. -Continue Plavix and statin.  -Overall prognosis is poor.  Left leg cellulitis Keeping legs elevated.  Discontinued vancomycin today.  Continue Cefepime for now (primarily due to positive urine culture).  Acute systolic CHF EF appears improved compared to 2008 but patient is actively declining.  Supportive care.  Hypotension Caution with IV pain medications.  Fluid boluses prn.  Acute on chronic kidney disease stage III Possibly cardiorenal. Unable to diurese patient at this time. Monitor closely.  Elevated troponin Possibly demand ischemia due to underlying CHF. Patient denies chest pain symptoms  CAD Continue aspirin, Plavix and statin.  A. fib Not on anticoagulation. Continue amiodarone. Hold beta blocker  Hypothyroidism Continue Synthroid.  thrombocytopenia Possibly due to acute illness. Discontinue Lovenox. Stopped vancomycin today as well.  Goals of care Overall prognosis is poor. Given her severe cardiomyopathy with active respiratory failure and hypotension, treatment options are limited. Family in agreement with palliative care, but they no longer want her transferred out because she is actively declining.  Code Status : DO NOT RESUSCITATE  Family Communication  : Multiple family members including son, great grandson, nieces at  bedside  Disposition Plan  : Remain in palliative care unit   Barriers For Discharge :  Actively dying  Consults  :   PC CM  Procedures  : 2-D echo   DVT Prophylaxis  :  SCDs, (Thrombocytopenia)  Lab Results  Component Value Date   PLT 83* 10/03/2015    Antibiotics  :    Anti-infectives    Start     Dose/Rate Route Frequency Ordered Stop   10/04/15 1000  vancomycin (VANCOCIN) 1,500 mg in sodium chloride 0.9 % 500 mL IVPB  Status:  Discontinued     1,500 mg 250 mL/hr over 120 Minutes Intravenous Every 48 hours 10/09/2015 0815 10/04/15 1150   10/07/2015 1600  piperacillin-tazobactam (ZOSYN) IVPB 2.25 g  Status:  Discontinued     2.25 g 100 mL/hr over 30 Minutes Intravenous Every 6 hours 10/07/2015 0815 09/30/2015 1204   10/29/2015 1400  ceFEPIme (MAXIPIME) 1 g in dextrose 5 % 50 mL IVPB     1 g 100 mL/hr over 30 Minutes Intravenous Every 24 hours 10/04/2015 1205     10/11/2015 0815  piperacillin-tazobactam (ZOSYN) IVPB 3.375 g     3.375 g 100 mL/hr over 30 Minutes Intravenous  Once 10/14/2015 0803 10/27/2015 0852   10/13/2015 0815  vancomycin (VANCOCIN) IVPB 1000 mg/200 mL premix  Status:  Discontinued     1,000 mg 200 mL/hr over 60 Minutes Intravenous  Once 10/14/2015 0803 10/01/2015 0805   10/26/2015 0815  vancomycin (VANCOCIN) 2,000 mg in sodium chloride 0.9 % 500 mL IVPB     2,000 mg 250 mL/hr over 120 Minutes Intravenous  Once 10/18/2015 0805 10/17/2015 1232        Objective:   Filed Vitals:   10/03/15 1500 10/03/15 1600 10/03/15 1859 10/04/15 0128  BP: 77/61 86/55 88/41  122/74  Pulse: 73 73 77 89  Temp:   97.5 F (36.4 C) 97.5 F (36.4 C)  TempSrc:   Axillary Axillary  Resp: 18 25  19   Height:      Weight:      SpO2: 95% 96% 94% 94%    Wt Readings from Last 3 Encounters:  10/03/15 107.956 kg (238 lb)  08/26/15 105.688 kg (233 lb)  08/06/15 100.585 kg (221 lb 12 oz)     Intake/Output Summary (Last 24 hours) at 10/04/15 1552 Last data filed at 10/04/15 1430  Gross per 24 hour   Intake    150 ml  Output    175 ml  Net    -25 ml     Physical Exam  Gen: Ill appearing, pale Pulm: Diminished bilateral breath sounds CVS: Irregular GI: mildly distended; decreased bowel sounds, Foley catheter + Musculoskeletal: Erythema over left leg, 1+ pitting edema bilaterally CNS: Awake but does not follow commands    Data Review:    CBC  Recent Labs Lab 2015-07-28 0620 2015-07-28 0656 2015-07-28 1549 10/03/15 0330  WBC 8.8  --  5.6 6.5  HGB 15.5* 18.7* 13.5 12.2  HCT 53.0* 55.0* 45.3 42.6  PLT 139*  --  85* 83*  MCV 96.9  --  98.5 98.8  MCH 28.3  --  29.3 28.3  MCHC 29.2*  --  29.8* 28.6*  RDW 15.3  --  15.5 15.8*  LYMPHSABS 1.3  --   --   --   MONOABS 1.1*  --   --   --   EOSABS 0.0  --   --   --   BASOSABS 0.0  --   --   --     Chemistries   Recent Labs Lab 2015-07-28 0620 2015-07-28 0656 2015-07-28 1549 10/03/15 0330  NA 136 133*  --  138  K 4.9 5.1  --  4.4  CL 93* 94*  --  96*  CO2 28  --   --  28  GLUCOSE 143* 135*  --  101*  BUN 44* 59*  --  49*  CREATININE 2.80* 2.40* 2.86* 2.85*  CALCIUM 8.9  --   --  8.3*  AST 26  --   --   --   ALT 17  --   --   --   ALKPHOS 50  --   --   --   BILITOT 2.8*  --   --   --    Cardiac Enzymes  Recent Labs Lab 2015-07-28 0620  TROPONINI 0.20*   ------------------------------------------------------------------------------------------------------------------    Component Value Date/Time   BNP 1179.0* 06-29-15 0620    Inpatient Medications  Scheduled Meds: . amiodarone  100 mg Oral Daily  . antiseptic oral rinse  7 mL Mouth Rinse BID  . aspirin EC  81 mg Oral Daily  . ceFEPime (MAXIPIME) IV  1 g Intravenous Q24H  . clopidogrel  75 mg Oral QHS  . latanoprost  1 drop Left Eye QHS  . levothyroxine  50 mcg Oral QAC breakfast  .  morphine injection  1 mg Intravenous Once  . sodium chloride  250 mL Intravenous Once   Continuous Infusions:   PRN Meds:.sodium chloride, acetaminophen, HYDROmorphone  (DILAUDID) injection, LORazepam, ondansetron (ZOFRAN) IV  Micro Results Recent Results (from the past 240 hour(s))  Blood Culture (routine x 2)     Status: None (Preliminary result)   Collection Time: 2015-07-28  6:20 AM  Result Value Ref Range Status   Specimen Description BLOOD LEFT ARM  Final   Special Requests IN PEDIATRIC BOTTLE 6ML  Final   Culture NO GROWTH 2 DAYS  Final   Report Status PENDING  Incomplete  Blood Culture (routine x 2)     Status: None (Preliminary result)   Collection Time: 2015-07-28  6:45 AM  Result Value Ref Range Status   Specimen Description BLOOD RIGHT ARM  Final   Special Requests IN PEDIATRIC BOTTLE  Final   Culture NO GROWTH 2 DAYS  Final   Report Status PENDING  Incomplete  Urine culture     Status: Abnormal   Collection Time: 10/25/2015  9:13 AM  Result Value Ref Range Status   Specimen Description URINE, CATHETERIZED  Final   Special Requests NONE  Final   Culture >=100,000 COLONIES/mL KLEBSIELLA PNEUMONIAE (A)  Final   Report Status 10/04/2015 FINAL  Final   Organism ID, Bacteria KLEBSIELLA PNEUMONIAE (A)  Final      Susceptibility   Klebsiella pneumoniae - MIC*    AMPICILLIN >=32 RESISTANT Resistant     CEFAZOLIN <=4 SENSITIVE Sensitive     CEFTRIAXONE <=1 SENSITIVE Sensitive     CIPROFLOXACIN <=0.25 SENSITIVE Sensitive     GENTAMICIN <=1 SENSITIVE Sensitive     IMIPENEM <=0.25 SENSITIVE Sensitive     NITROFURANTOIN 32 SENSITIVE Sensitive     TRIMETH/SULFA <=20 SENSITIVE Sensitive     AMPICILLIN/SULBACTAM 4 SENSITIVE Sensitive     PIP/TAZO <=4 SENSITIVE Sensitive     * >=100,000 COLONIES/mL KLEBSIELLA PNEUMONIAE  MRSA PCR Screening     Status: None   Collection Time: 10/03/15  4:34 AM  Result Value Ref Range Status   MRSA by PCR NEGATIVE NEGATIVE Final    Comment:        The GeneXpert MRSA Assay (FDA approved for NASAL specimens only), is one component of a comprehensive MRSA colonization surveillance program. It is not intended to  diagnose MRSA infection nor to guide or monitor treatment for MRSA infections.     Radiology Reports Dg Chest Port 1 View  10/03/2015  CLINICAL DATA:  Respiratory failure. EXAM: PORTABLE CHEST 1 VIEW COMPARISON:  10/28/2015. FINDINGS: AICD in stable position. Prior CABG. Cardiomegaly with mild pulmonary vascular prominence. Bilateral interstitial prominence and bilateral pleural effusions. Findings consistent congestive heart failure. Low lung volumes with bibasilar atelectasis. Bibasilar pneumonia cannot be excluded. Similar findings noted on prior study. No pneumothorax. IMPRESSION: 1. AICD in stable position.  Prior CABG. 2. Cardiomegaly with mild pulmonary vascular prominence. Bilateral interstitial prominence and bilateral pleural effusions. Findings consistent congestive heart failure. No change from prior exam. 3. Low lung volumes with bibasilar atelectasis. Bibasilar pneumonia cannot be excluded. Similar findings noted on prior exam . Electronically Signed   By: Maisie Fus  Register   On: 10/03/2015 07:17   Dg Chest Portable 1 View  10/13/2015  CLINICAL DATA:  Shortness of breath for a week, worse today. EXAM: PORTABLE CHEST 1 VIEW COMPARISON:  08/06/2015 FINDINGS: Postoperative changes in the mediastinum. Cardiac pacemaker. Cardiac enlargement. No vascular congestion. Bilateral basilar infiltration or atelectasis with small bilateral pleural effusions. Changes are progressing since previous study. No pneumothorax. Calcified and tortuous aorta. IMPRESSION: Progressing changes of infiltration or atelectasis in the lung bases and small bilateral pleural effusions. Electronically Signed   By: Burman Nieves M.D.   On: 10/19/2015 06:46    Time Spent in minutes  20   Jerene Bears M.D on 10/04/2015 at 3:52 PM   [facility] -  Office  [phone]

## 2015-10-04 NOTE — Progress Notes (Signed)
Pt appears to be in pain in spite of oral morphine.  Dr. Montez Moritaarter texted.  Spoke with family at length about wanting to make pt more comfortable.  Turned pt on her left side which is the side she prefers.  Pillows placed under swollen legs bilaterally and elevated.

## 2015-10-05 MED ORDER — GLYCOPYRROLATE 0.2 MG/ML IJ SOLN
0.2000 mg | INTRAMUSCULAR | Status: DC | PRN
  Administered 2015-10-07: 0.2 mg via INTRAVENOUS
  Filled 2015-10-05: qty 1

## 2015-10-05 MED ORDER — LORAZEPAM 2 MG/ML IJ SOLN: 0.5000 mg | INTRAMUSCULAR | Status: DC | PRN

## 2015-10-05 NOTE — Progress Notes (Signed)
Pharmacy Antibiotic Note  [name] is a 82 y.o. female admitted on 10/24/2015 with UTI.  Pharmacy has been consulted for Cefepime dosing.  Last Cr remain elevated and stable, though with minimal urine output.  Pt has been afebrile.  Per discussion with Dr. Montez Moritaarter yesterday, Vancomycin was stopped 5/6.  Plan: Continue Cefepime 1g IV q24, today is day#4 of therapy Consider changing to Cefazolin to complete 7 days total antibiotics Pharmacy will sign off   Height: 5\' 3"  (160 cm) Weight: 238 lb (107.956 kg) IBW/kg (Calculated) : 52.4  Temp (24hrs), Avg:97.7 F (36.5 C), Min:97.7 F (36.5 C), Max:97.7 F (36.5 C)   Recent Labs Lab 02-Sep-2015 0620 02-Sep-2015 0656 02-Sep-2015 0657 02-Sep-2015 1549 10/03/15 0330  WBC 8.8  --   --  5.6 6.5  CREATININE 2.80* 2.40*  --  2.86* 2.85*  LATICACIDVEN  --   --  2.36*  --   --     Estimated Creatinine Clearance: 15.1 mL/min (by C-G formula based on Cr of 2.85).    No Known Allergies  Antimicrobials this admission: Vanc 5/4>>5/6 Cefepime 5/4>> Zosyn x 1 on 5/4  Microbiology results: 5/5 BCx2>> ntd 5/5 UCx2>> Kleb > 100K.  S:Cephs, FQs, Unasyn  Thank you for allowing pharmacy to be a part of this patient's care.  Marisue HumbleKendra Melani Brisbane, PharmD Clinical Pharmacist [facility]

## 2015-10-05 NOTE — Progress Notes (Signed)
Daily Progress Note   Patient Name: [name]       Date: 10/05/2015 DOB: May 23, 1922  Age: 82 y.o. MRN#: [id number] Attending Physician: Michael Litter, MD Primary Care Physician: Roxy Manns, MD Admit Date: 24-Oct-2015  Reason for Consultation/Follow-up: Non pain symptom management, Pain control and Terminal Care  Subjective: Patient is more comfortable on the Dilaudid with longer pain control. She has only had 2 when necessary's overnight. She is no longer taking anything by mouth, and only a few sips of water. Has been unable to take any of her pills today. Her son is not at the bedside today. She continues to have a large family presence, with her grandchildren being at her side this afternoon. Respirations are even and unlabored no nonverbal signs and symptoms of pain noted  Length of Stay: 3  Current Medications: Scheduled Meds:  . antiseptic oral rinse  7 mL Mouth Rinse BID  . ceFEPime (MAXIPIME) IV  1 g Intravenous Q24H  . latanoprost  1 drop Left Eye QHS  . sodium chloride  250 mL Intravenous Once    Continuous Infusions:    PRN Meds: sodium chloride, glycopyrrolate, HYDROmorphone (DILAUDID) injection, LORazepam, ondansetron (ZOFRAN) IV  Physical Exam  Constitutional: She appears well-developed and well-nourished.  Cardiovascular:  irrg  Pulmonary/Chest: Effort normal.  Neurological:  Minimally responsive  Skin: Skin is warm.  Nursing note and vitals reviewed.           Vital Signs: BP 100/60 mmHg  Pulse 67  Temp(Src) 97.7 F (36.5 C) (Axillary)  Resp 19  Ht  (1.6 m)  Wt 107.956 kg (238 lb)  BMI 42.17 kg/m2  SpO2 86% SpO2: SpO2: (!) 86 % O2 Device: O2 Device: Nasal Cannula O2 Flow Rate: O2 Flow Rate (L/min): 6 L/min  Intake/output summary:    Intake/Output Summary (Last 24 hours) at 10/05/15 1838 Last data filed at 10/05/15 1300  Gross per 24 hour  Intake     80 ml  Output     25 ml  Net     55 ml   LBM: Last BM Date: 10/03/15 Baseline Weight: Weight: 113.399 kg (250 lb) Most recent weight: Weight: 107.956 kg (238 lb)       Palliative Assessment/Data:    Flowsheet Rows        Most  Recent Value   Intake Tab    Referral Department  Hospitalist   Unit at Time of Referral  Intermediate Care Unit   Palliative Care Primary Diagnosis  Cardiac   Date Notified  10/03/15   Palliative Care Type  New Palliative care   Reason for referral  End of Life Care Assistance, Counsel Regarding Hospice   Date of Admission  10/20/2015   Date first seen by Palliative Care  10/03/15   # of days IP prior to Palliative referral  1   Clinical Assessment    Palliative Performance Scale Score  20%   Pain Max last 24 hours  6   Pain Min Last 24 hours  5   Dyspnea Max Last 24 Hours  7   Dyspnea Min Last 24 hours  6   Psychosocial & Spiritual Assessment    Palliative Care Outcomes    Patient/Family meeting held?  Yes   Who was at the meeting?  son, grand daughter, nieces, Optician, dispensing.    Palliative Care Outcomes  Counseled regarding hospice   Palliative Care follow-up planned  Yes, Home   Other Treatment Preference Instructions  family wants STAT home with hospice d/c       Patient Active Problem List   Diagnosis Date Noted  . Palliative care encounter   . Acute respiratory failure with hypoxemia (HCC)   . Acute renal failure superimposed on stage 3 chronic kidney disease (HCC) 10/03/2015  . Cardiorenal syndrome with renal failure 10/03/2015  . Acute on chronic combined systolic and diastolic heart failure (HCC) 10/03/2015  . Thrombocytopenia (HCC) 10/03/2015  . Cellulitis of leg, left 10/03/2015  . Hypotension 10/03/2015  . Encounter for palliative care   . Goals of care, counseling/discussion   . CHF exacerbation (HCC) 10/12/2015   . CAP (community acquired pneumonia) 08/26/2015  . Hemoptysis 08/06/2015  . Abrasion 10/15/2013  . Skin lesion of back 10/15/2013  . Fall 10/15/2013  . Inflamed sebaceous cyst 04/11/2013  . Vertigo, benign positional 03/15/2012  . Sebaceous cyst of right axilla 03/15/2012  . Mechanical complication due to cardiac pacemaker (electrode) 03/10/2012  . Pacemaker-St.Jude 03/03/2012  . COPD (chronic obstructive pulmonary disease) (HCC) 01/28/2012  . Chronic low back pain 01/28/2012  . Chronic systolic heart failure (HCC) 10/20/2011  . Hypoxia 04/23/2011  . AAA 09/09/2009  . LBBB 06/05/2008  . Ventricular fibrillation (HCC) 06/05/2008  . HYPERLIPIDEMIA 01/03/2007  . GLAUCOMA 01/03/2007  . MYOCARDIAL INFARCTION, HX OF 01/03/2007  . CARDIOMYOPATHY, ISCHEMIC 01/03/2007  . Atrial fibrillation (HCC) 01/03/2007  . OSTEOARTHRITIS 01/03/2007  . UTERINE CANCER, HX OF 01/03/2007    Palliative Care Assessment & Plan   Patient Profile: 82 year old female with history of atrial fibrillation not on anticoagulation, coronary artery disease with history of MI, systolic heart failure with an ejection fraction of 15-20% status post ICD, chronic kidney disease stage III, hypertension who was brought to the emergency room on 09/30/2015 with progressive shortness of breath and bilateral leg swelling. On presentation she was in acute hypercapnic respiratory failure secondary to CHF exacerbation and pneumonia. She was placed on BiPAP, and diuresed with Lasix. She has remained dyspneic and hypotensive.  Assessment: Patient minimally responsive, but grandchildren at the bedside state that she was more interactive earlier in the day especially when her sister arrived  Recommendations/Plan: Deactivate AICD. Order placed to nursing. Place magnet to chest in the interim  Pain: Continue Dilaudid IV 0.25-0.5 mg every 2 when necessary; monitor for need for scheduled dosing  Agitation:  Continue Ativan IV when  necessary  Secretions: Patient high risk for terminal secretions secondary to volume overload; will add when necessary Robinul. Monitor for need for scheduled dosing  Goals of Care and Additional Recommendations:  Limitations on Scope of Treatment: Full comfort care except for antibiotics. Patient's healthcare POA is her son and he was not at the bedside today to address ongoing usage of antibiotics. Will follow-up on 10/06/2015  Code Status:    Code Status Orders        Start     Ordered   10/28/2015 1507  Do not attempt resuscitation (DNR)   Continuous    Question Answer Comment  In the event of cardiac or respiratory ARREST Do not call a "code blue"   In the event of cardiac or respiratory ARREST Do not perform Intubation, CPR, defibrillation or ACLS   In the event of cardiac or respiratory ARREST Use medication by any route, position, wound care, and other measures to relive pain and suffering. May use oxygen, suction and manual treatment of airway obstruction as needed for comfort.      09/29/2015 1506    Code Status History    Date Active Date Inactive Code Status Order ID Comments User Context   10/24/2015  1:11 PM 10/10/2015  3:06 PM DNR [id number]  Lupita Leashouglas B McQuaid, MD ED   04/23/2011 10:59 PM 04/26/2011  7:28 PM Full Code [id number]  Mechele CollinShana Diandra Spencer, RN Inpatient    Advance Directive Documentation        Most Recent Value   Type of Advance Directive  Healthcare Power of Attorney   Pre-existing out of facility DNR order (yellow form or pink MOST form)     "MOST" Form in Place?         Prognosis:   Hours - Days  Discharge Planning:  Anticipate hospital death at this point   Care plan was discussed with Dr. Linna DarnerAnwar  Thank you for allowing the Palliative Medicine Team to assist in the care of this patient.   Time In: 1700 Time Out: 1740 Total Time 40 min Prolonged Time Billed  no       Greater than 50%  of this time was spent counseling and coordinating care  related to the above assessment and plan.  Irean HongSarah Grace Marla Pouliot, NP  Please contact Palliative Medicine Team phone at [phone] for questions and concerns.

## 2015-10-05 NOTE — Progress Notes (Signed)
PROGRESS NOTE                                                                                                                                                                                                             Patient Demographics:    [name], is a 82 y.o. female, DOB - 04-29-1922, RUE:[id number]  Admit date - 10-14-2015   Admitting Physician Lupita Leash, MD  Outpatient Primary MD for the patient is Roxy Manns, MD  LOS - 3  Outpatient Specialists: Sharlot Gowda taylor: cardiology   Chief Complaint  Patient presents with  . Respiratory Distress       Brief Narrative   82 year old female with history of A. fib not on anticoagulation, CAD with history of MI, systolic CHF with EF of 15-20% status post ICD, chronic kidney disease stage III, hypertension and Morbid Obesity (BMI 44) was brought to the ED on 2015-10-14 with progressive shortness of breath and bilateral leg swelling. As per family patient having increased shortness of breath for about 2 months with leg swellings. Her PCP doubled her dose of Lasix and also added metolazone which did not improve her symptoms. Patient also having cough for several days. (Was treated for CAP in March). Family also noticed increased left leg redness on the day prior to admission. On presentation she was found to be in acute hypercapnic respiratory failure secondary to CHF exacerbation and possible pneumonia. She was also found to have thrombocytopenia and acute on chronic kidney disease stage III. Patient admitted to stepdown under ICU team on BiPAP. She was diuresed with Lasix and transferred to hospitalist service on 5/5. However patient still dyspneic and significantly hypotensive.    Subjective:   Palliative Care is following the patient.  More awake today.  In pain after having a bath.  The patient interacts with me today.  She tells me "Getting old is a b----"  More  family, including her sister, present today.   Assessment  & Plan :   Principal problem Acute combined hypoxemic and hypercapnic respiratory failure Likely primarily due to acute systolic CHF exacerbation. Repeat 2-D echo this admission, EF 30-35%. Showed some improvement with IV Lasix and BiPAP on admission however patient still dyspneic requiring up to 5 L via nasal cannula and further diuresis limited due to low blood pressure. Patient required some IV  normal saline bolus this morning. -Not actively diuresing due to relative hypotension. -Continue Plavix and statin.  -Overall prognosis is poor.  Left leg cellulitis Keeping legs elevated.  Discontinued vancomycin.  Continue Cefepime for now (primarily due to positive urine culture).  Acute systolic CHF EF appears improved compared to 2008 but patient is actively declining.  Supportive care.  Hypotension Caution with IV pain medications.  Fluid boluses prn.  Acute on chronic kidney disease stage III Possibly cardiorenal. Unable to diurese patient at this time. Monitor closely.  Elevated troponin Possibly demand ischemia due to underlying CHF. Patient denies chest pain symptoms  CAD Continue aspirin, Plavix and statin.  A. fib Not on anticoagulation. Continue amiodarone. Hold beta blocker  Hypothyroidism Continue Synthroid.  thrombocytopenia Possibly due to acute illness. Stopped lovenox and vancomycin.  Goals of care Overall prognosis is poor. Given her severe cardiomyopathy with active respiratory failure and hypotension, treatment options are limited. Family in agreement with palliative care, but they no longer want her transferred out because she is actively declining.  Code Status : DO NOT RESUSCITATE  Family Communication  : Multiple family members at bedside  Disposition Plan  : Remain in palliative care unit   Barriers For Discharge :  Actively dying  Consults  :   PC CM  Procedures  : 2-D echo   DVT  Prophylaxis  :  SCDs, (Thrombocytopenia)  Lab Results  Component Value Date   PLT 83* 10/03/2015    Antibiotics  :    Anti-infectives    Start     Dose/Rate Route Frequency Ordered Stop   10/04/15 1000  vancomycin (VANCOCIN) 1,500 mg in sodium chloride 0.9 % 500 mL IVPB  Status:  Discontinued     1,500 mg 250 mL/hr over 120 Minutes Intravenous Every 48 hours 10/18/2015 0815 10/04/15 1150   09/30/2015 1600  piperacillin-tazobactam (ZOSYN) IVPB 2.25 g  Status:  Discontinued     2.25 g 100 mL/hr over 30 Minutes Intravenous Every 6 hours 10/25/2015 0815 09/30/2015 1204   10/23/2015 1400  ceFEPIme (MAXIPIME) 1 g in dextrose 5 % 50 mL IVPB     1 g 100 mL/hr over 30 Minutes Intravenous Every 24 hours 10/25/2015 1205     10/23/2015 0815  piperacillin-tazobactam (ZOSYN) IVPB 3.375 g     3.375 g 100 mL/hr over 30 Minutes Intravenous  Once 09/29/2015 0803 10/07/2015 0852   10/05/2015 0815  vancomycin (VANCOCIN) IVPB 1000 mg/200 mL premix  Status:  Discontinued     1,000 mg 200 mL/hr over 60 Minutes Intravenous  Once 10/06/2015 0803 10/23/2015 0805   10/06/2015 0815  vancomycin (VANCOCIN) 2,000 mg in sodium chloride 0.9 % 500 mL IVPB     2,000 mg 250 mL/hr over 120 Minutes Intravenous  Once 10/06/2015 0805 10/21/2015 1232        Objective:   Filed Vitals:   10/03/15 1600 10/03/15 1859 10/04/15 0128 10/05/15 0522  BP: 86/55 88/41 122/74 100/60  Pulse: 73 77 89 67  Temp:  97.5 F (36.4 C) 97.5 F (36.4 C) 97.7 F (36.5 C)  TempSrc:  Axillary Axillary Axillary  Resp: 25  19 19   Height:      Weight:      SpO2: 96% 94% 94% 86%    Wt Readings from Last 3 Encounters:  10/03/15 107.956 kg (238 lb)  08/26/15 105.688 kg (233 lb)  08/06/15 100.585 kg (221 lb 12 oz)     Intake/Output Summary (Last 24 hours) at 10/05/15 2213 Last  data filed at 10/05/15 1300  Gross per 24 hour  Intake     80 ml  Output     25 ml  Net     55 ml     Physical Exam  Gen: Ill appearing, pale Pulm: Diminished bilateral breath  sounds CVS: Irregular GI: mildly distended; decreased bowel sounds, Foley catheter + Musculoskeletal: Erythema over left leg, Pitting edema less tense today CNS: Awake, interactive, complaining of knee pain    Data Review:    CBC  Recent Labs Lab 10/09/2015 0620 10/24/2015 0656 10/25/2015 1549 10/03/15 0330  WBC 8.8  --  5.6 6.5  HGB 15.5* 18.7* 13.5 12.2  HCT 53.0* 55.0* 45.3 42.6  PLT 139*  --  85* 83*  MCV 96.9  --  98.5 98.8  MCH 28.3  --  29.3 28.3  MCHC 29.2*  --  29.8* 28.6*  RDW 15.3  --  15.5 15.8*  LYMPHSABS 1.3  --   --   --   MONOABS 1.1*  --   --   --   EOSABS 0.0  --   --   --   BASOSABS 0.0  --   --   --     Chemistries   Recent Labs Lab 09/30/2015 0620 10/29/2015 0656 10/24/2015 1549 10/03/15 0330  NA 136 133*  --  138  K 4.9 5.1  --  4.4  CL 93* 94*  --  96*  CO2 28  --   --  28  GLUCOSE 143* 135*  --  101*  BUN 44* 59*  --  49*  CREATININE 2.80* 2.40* 2.86* 2.85*  CALCIUM 8.9  --   --  8.3*  AST 26  --   --   --   ALT 17  --   --   --   ALKPHOS 50  --   --   --   BILITOT 2.8*  --   --   --    Cardiac Enzymes  Recent Labs Lab 10/22/2015 0620  TROPONINI 0.20*   ------------------------------------------------------------------------------------------------------------------    Component Value Date/Time   BNP 1179.0* 10/23/2015 0620    Inpatient Medications  Scheduled Meds: . antiseptic oral rinse  7 mL Mouth Rinse BID  . ceFEPime (MAXIPIME) IV  1 g Intravenous Q24H  . latanoprost  1 drop Left Eye QHS  . sodium chloride  250 mL Intravenous Once   Continuous Infusions:   PRN Meds:.sodium chloride, glycopyrrolate, HYDROmorphone (DILAUDID) injection, LORazepam, ondansetron (ZOFRAN) IV  Micro Results Recent Results (from the past 240 hour(s))  Blood Culture (routine x 2)     Status: None (Preliminary result)   Collection Time: 10/24/2015  6:20 AM  Result Value Ref Range Status   Specimen Description BLOOD LEFT ARM  Final   Special  Requests IN PEDIATRIC BOTTLE  Final   Culture NO GROWTH 3 DAYS  Final   Report Status PENDING  Incomplete  Blood Culture (routine x 2)     Status: None (Preliminary result)   Collection Time: 10/18/2015  6:45 AM  Result Value Ref Range Status   Specimen Description BLOOD RIGHT ARM  Final   Special Requests IN PEDIATRIC BOTTLE  Final   Culture NO GROWTH 3 DAYS  Final   Report Status PENDING  Incomplete  Urine culture     Status: Abnormal   Collection Time: 10/07/2015  9:13 AM  Result Value Ref Range Status   Specimen Description URINE, CATHETERIZED  Final  Special Requests NONE  Final   Culture >=100,000 COLONIES/mL KLEBSIELLA PNEUMONIAE (A)  Final   Report Status 10/04/2015 FINAL  Final   Organism ID, Bacteria KLEBSIELLA PNEUMONIAE (A)  Final      Susceptibility   Klebsiella pneumoniae - MIC*    AMPICILLIN >=32 RESISTANT Resistant     CEFAZOLIN <=4 SENSITIVE Sensitive     CEFTRIAXONE <=1 SENSITIVE Sensitive     CIPROFLOXACIN <=0.25 SENSITIVE Sensitive     GENTAMICIN <=1 SENSITIVE Sensitive     IMIPENEM <=0.25 SENSITIVE Sensitive     NITROFURANTOIN 32 SENSITIVE Sensitive     TRIMETH/SULFA <=20 SENSITIVE Sensitive     AMPICILLIN/SULBACTAM 4 SENSITIVE Sensitive     PIP/TAZO <=4 SENSITIVE Sensitive     * >=100,000 COLONIES/mL KLEBSIELLA PNEUMONIAE  MRSA PCR Screening     Status: None   Collection Time: 10/03/15  4:34 AM  Result Value Ref Range Status   MRSA by PCR NEGATIVE NEGATIVE Final    Comment:        The GeneXpert MRSA Assay (FDA approved for NASAL specimens only), is one component of a comprehensive MRSA colonization surveillance program. It is not intended to diagnose MRSA infection nor to guide or monitor treatment for MRSA infections.     Radiology Reports Dg Chest Port 1 View  10/03/2015  CLINICAL DATA:  Respiratory failure. EXAM: PORTABLE CHEST 1 VIEW COMPARISON:  10-27-15. FINDINGS: AICD in stable position. Prior CABG. Cardiomegaly with mild pulmonary  vascular prominence. Bilateral interstitial prominence and bilateral pleural effusions. Findings consistent congestive heart failure. Low lung volumes with bibasilar atelectasis. Bibasilar pneumonia cannot be excluded. Similar findings noted on prior study. No pneumothorax. IMPRESSION: 1. AICD in stable position.  Prior CABG. 2. Cardiomegaly with mild pulmonary vascular prominence. Bilateral interstitial prominence and bilateral pleural effusions. Findings consistent congestive heart failure. No change from prior exam. 3. Low lung volumes with bibasilar atelectasis. Bibasilar pneumonia cannot be excluded. Similar findings noted on prior exam . Electronically Signed   By: Maisie Fus  Register   On: 10/03/2015 07:17   Dg Chest Portable 1 View  10-27-15  CLINICAL DATA:  Shortness of breath for a week, worse today. EXAM: PORTABLE CHEST 1 VIEW COMPARISON:  08/06/2015 FINDINGS: Postoperative changes in the mediastinum. Cardiac pacemaker. Cardiac enlargement. No vascular congestion. Bilateral basilar infiltration or atelectasis with small bilateral pleural effusions. Changes are progressing since previous study. No pneumothorax. Calcified and tortuous aorta. IMPRESSION: Progressing changes of infiltration or atelectasis in the lung bases and small bilateral pleural effusions. Electronically Signed   By: Burman Nieves M.D.   On: 2015/10/27 06:46    Time Spent in minutes  20   Jerene Bears M.D on 10/05/2015 at 10:13 PM   [facility] -  Office  [phone]

## 2015-10-06 ENCOUNTER — Other Ambulatory Visit: Payer: Medicare Other

## 2015-10-06 DIAGNOSIS — I481 Persistent atrial fibrillation: Secondary | ICD-10-CM

## 2015-10-06 DIAGNOSIS — I5043 Acute on chronic combined systolic (congestive) and diastolic (congestive) heart failure: Secondary | ICD-10-CM

## 2015-10-06 DIAGNOSIS — N179 Acute kidney failure, unspecified: Secondary | ICD-10-CM

## 2015-10-06 DIAGNOSIS — N183 Chronic kidney disease, stage 3 (moderate): Secondary | ICD-10-CM

## 2015-10-06 NOTE — Progress Notes (Signed)
Nutrition Brief Note  Chart reviewed. Pt now transitioning to comfort care.  No further nutrition interventions warranted at this time.  Please re-consult as needed.   Ardean Melroy A. Alonah Lineback, RD, LDN, CDE Pager: [phone] After hours Pager: [phone]  

## 2015-10-06 NOTE — Progress Notes (Signed)
Per conversation with rapid response RN- Wes, AICD can only be turned off by MD and magnet should not be placed on patient's chest at this time.

## 2015-10-06 NOTE — Progress Notes (Addendum)
PROGRESS NOTE                                                                                                                                                                                                             Patient Demographics:    [name], is a 82 y.o. female, DOB - 12-14-1921, ZOX:[id number]  Admit date - 2015-10-23   Admitting Physician Lupita Leash, MD  Outpatient Primary MD for the patient is Roxy Manns, MD  LOS - 4  Outpatient Specialists: Sharlot Gowda taylor: cardiology   Chief Complaint  Patient presents with  . Respiratory Distress       Brief Narrative   82 year old female with history of A. fib not on anticoagulation, CAD with history of MI, systolic CHF with EF of 15-20% status post ICD, chronic kidney disease stage III, hypertension and Morbid Obesity (BMI 44) was brought to the ED on 10/23/2015 with progressive shortness of breath and bilateral leg swelling. As per family patient having increased shortness of breath for about 2 months with leg swellings. Her PCP doubled her dose of Lasix and also added metolazone which did not improve her symptoms. Patient also having cough for several days. (Was treated for CAP in March). Family also noticed increased left leg redness on the day prior to admission. On presentation she was found to be in acute hypercapnic respiratory failure secondary to CHF exacerbation and possible pneumonia. She was also found to have thrombocytopenia and acute on chronic kidney disease stage III. Patient admitted to stepdown under ICU team on BiPAP. She was diuresed with Lasix and transferred to hospitalist service on 5/5. However patient still dyspneic and significantly hypotensive.    Subjective:   Palliative Care is following the patient.  Somnolent hypotensive, appears to be comfortable.      Assessment  & Plan :   Principal problem Acute combined hypoxemic and  hypercapnic respiratory failure Likely primarily due to acute systolic CHF exacerbation. Repeat 2-D echo this admission, EF 30-35%. Showed some improvement with IV Lasix and BiPAP on admission however patient still dyspneic requiring up to 6 L via nasal cannula and further diuresis limited due to low blood pressure. Patient required some IV normal saline bolus this morning. -Not actively diuresing due to relative hypotension.Diuretics discontinued Discontinued Plavix and statin.  -Overall prognosis is poor.  Left leg  cellulitis Keeping legs elevated.  Discontinued vancomycin.  Discontinue cefepime   Acute systolic CHF EF appears improved compared to 2008 but patient is actively declining.  Supportive care.  Hypotension Caution with IV pain medications.  Fluid boluses prn.  Acute on chronic kidney disease stage III Possibly cardiorenal. Unable to diurese patient at this time. Monitor closely.  Elevated troponin Possibly demand ischemia due to underlying CHF. Patient denies chest pain symptoms  CAD Discontinued aspirin, Plavix and statin.  A. fib Not on anticoagulation. Discontinued amiodarone. Hold beta blocker  Hypothyroidism Discontinued Synthroid.  thrombocytopenia Possibly due to acute illness. Stopped lovenox and vancomycin.  Goals of care Overall prognosis is poor. Given her severe cardiomyopathy with active respiratory failure and hypotension, treatment options are limited. Family in agreement with palliative care, but they no longer want her transferred out because she is actively declining.  Code Status : DO NOT RESUSCITATE  Family Communication  : Multiple family members at bedside  Disposition Plan  : Remain in palliative care unit   Barriers For Discharge :  Actively dying  Consults  :   PC CM  Procedures  : 2-D echo   DVT Prophylaxis  :  SCDs, (Thrombocytopenia)  Lab Results  Component Value Date   PLT 83* 10/03/2015    Antibiotics  :     Vancomycin 5/4- 5/6 Zosyn 5/41 Cefepime 5/4-      Objective:   Filed Vitals:   10/03/15 1859 10/04/15 0128 10/05/15 0522 10/06/15 0630  BP: 88/41 122/74 100/60 77/38  Pulse: 77 89 67 73  Temp: 97.5 F (36.4 C) 97.5 F (36.4 C) 97.7 F (36.5 C) 98.7 F (37.1 C)  TempSrc: Axillary Axillary Axillary Axillary  Resp:  Height:      Weight:      SpO2: 94% 94% 86% 88%    Wt Readings from Last 3 Encounters:  10/03/15 107.956 kg (238 lb)  08/26/15 105.688 kg (233 lb)  08/06/15 100.585 kg (221 lb 12 oz)     Intake/Output Summary (Last 24 hours) at 10/06/15 0958 Last data filed at 10/06/15 0630  Gross per 24 hour  Intake     20 ml  Output      0 ml  Net     20 ml     Physical Exam  Gen: Ill appearing, pale Pulm: Diminished bilateral breath sounds CVS: Irregular GI: mildly distended; decreased bowel sounds, Foley catheter + Musculoskeletal: Erythema over left leg, Pitting edema less tense today CNS: Awake, interactive, complaining of knee pain    Data Review:    CBC  Recent Labs Lab Oct 30, 2015 0620 10/30/2015 0656 Oct 30, 2015 1549 10/03/15 0330  WBC 8.8  --  5.6 6.5  HGB 15.5* 18.7* 13.5 12.2  HCT 53.0* 55.0* 45.3 42.6  PLT 139*  --  85* 83*  MCV 96.9  --  98.5 98.8  MCH 28.3  --  29.3 28.3  MCHC 29.2*  --  29.8* 28.6*  RDW 15.3  --  15.5 15.8*  LYMPHSABS 1.3  --   --   --   MONOABS 1.1*  --   --   --   EOSABS 0.0  --   --   --   BASOSABS 0.0  --   --   --     Chemistries   Recent Labs Lab 10/30/15 0620 10/30/15 0656 Oct 30, 2015 1549 10/03/15 0330  NA 136 133*  --  138  K 4.9 5.1  --  4.4  CL 93* 94*  --  96*  CO2 28  --   --  28  GLUCOSE 143* 135*  --  101*  BUN 44* 59*  --  49*  CREATININE 2.80* 2.40* 2.86* 2.85*  CALCIUM 8.9  --   --  8.3*  AST 26  --   --   --   ALT 17  --   --   --   ALKPHOS 50  --   --   --   BILITOT 2.8*  --   --   --    Cardiac Enzymes  Recent Labs Lab 10/06/2015 0620  TROPONINI 0.20*    ------------------------------------------------------------------------------------------------------------------    Component Value Date/Time   BNP 1179.0* 09/30/2015 0620    Inpatient Medications  Scheduled Meds: . antiseptic oral rinse  7 mL Mouth Rinse BID  . ceFEPime (MAXIPIME) IV  1 g Intravenous Q24H  . latanoprost  1 drop Left Eye QHS  . sodium chloride  250 mL Intravenous Once   Continuous Infusions:   PRN Meds:.sodium chloride, glycopyrrolate, HYDROmorphone (DILAUDID) injection, LORazepam, ondansetron (ZOFRAN) IV  Micro Results Recent Results (from the past 240 hour(s))  Blood Culture (routine x 2)     Status: None (Preliminary result)   Collection Time: 10/07/2015  6:20 AM  Result Value Ref Range Status   Specimen Description BLOOD LEFT ARM  Final   Special Requests IN PEDIATRIC BOTTLE  Final   Culture NO GROWTH 3 DAYS  Final   Report Status PENDING  Incomplete  Blood Culture (routine x 2)     Status: None (Preliminary result)   Collection Time: 10/27/2015  6:45 AM  Result Value Ref Range Status   Specimen Description BLOOD RIGHT ARM  Final   Special Requests IN PEDIATRIC BOTTLE  Final   Culture NO GROWTH 3 DAYS  Final   Report Status PENDING  Incomplete  Urine culture     Status: Abnormal   Collection Time: 10/28/2015  9:13 AM  Result Value Ref Range Status   Specimen Description URINE, CATHETERIZED  Final   Special Requests NONE  Final   Culture >=100,000 COLONIES/mL KLEBSIELLA PNEUMONIAE (A)  Final   Report Status 10/04/2015 FINAL  Final   Organism ID, Bacteria KLEBSIELLA PNEUMONIAE (A)  Final      Susceptibility   Klebsiella pneumoniae - MIC*    AMPICILLIN >=32 RESISTANT Resistant     CEFAZOLIN <=4 SENSITIVE Sensitive     CEFTRIAXONE <=1 SENSITIVE Sensitive     CIPROFLOXACIN <=0.25 SENSITIVE Sensitive     GENTAMICIN <=1 SENSITIVE Sensitive     IMIPENEM <=0.25 SENSITIVE Sensitive     NITROFURANTOIN 32 SENSITIVE Sensitive     TRIMETH/SULFA <=20  SENSITIVE Sensitive     AMPICILLIN/SULBACTAM 4 SENSITIVE Sensitive     PIP/TAZO <=4 SENSITIVE Sensitive     * >=100,000 COLONIES/mL KLEBSIELLA PNEUMONIAE  MRSA PCR Screening     Status: None   Collection Time: 10/03/15  4:34 AM  Result Value Ref Range Status   MRSA by PCR NEGATIVE NEGATIVE Final    Comment:        The GeneXpert MRSA Assay (FDA approved for NASAL specimens only), is one component of a comprehensive MRSA colonization surveillance program. It is not intended to diagnose MRSA infection nor to guide or monitor treatment for MRSA infections.     Radiology Reports Dg Chest Port 1 View  10/03/2015  CLINICAL DATA:  Respiratory failure. EXAM: PORTABLE CHEST 1 VIEW COMPARISON:  10/11/2015. FINDINGS:  AICD in stable position. Prior CABG. Cardiomegaly with mild pulmonary vascular prominence. Bilateral interstitial prominence and bilateral pleural effusions. Findings consistent congestive heart failure. Low lung volumes with bibasilar atelectasis. Bibasilar pneumonia cannot be excluded. Similar findings noted on prior study. No pneumothorax. IMPRESSION: 1. AICD in stable position.  Prior CABG. 2. Cardiomegaly with mild pulmonary vascular prominence. Bilateral interstitial prominence and bilateral pleural effusions. Findings consistent congestive heart failure. No change from prior exam. 3. Low lung volumes with bibasilar atelectasis. Bibasilar pneumonia cannot be excluded. Similar findings noted on prior exam . Electronically Signed   By: Maisie Fushomas  Register   On: 10/03/2015 07:17   Dg Chest Portable 1 View  10/26/2015  CLINICAL DATA:  Shortness of breath for a week, worse today. EXAM: PORTABLE CHEST 1 VIEW COMPARISON:  08/06/2015 FINDINGS: Postoperative changes in the mediastinum. Cardiac pacemaker. Cardiac enlargement. No vascular congestion. Bilateral basilar infiltration or atelectasis with small bilateral pleural effusions. Changes are progressing since previous study. No pneumothorax.  Calcified and tortuous aorta. IMPRESSION: Progressing changes of infiltration or atelectasis in the lung bases and small bilateral pleural effusions. Electronically Signed   By: Burman NievesWilliam  Stevens M.D.   On: 10/11/2015 06:46    Time Spent in minutes  20   Shelbi Vaccaro M.D on 10/06/2015 at 9:58 AM Pager [phone]

## 2015-10-07 LAB — CULTURE, BLOOD (ROUTINE X 2)
Culture: NO GROWTH
Culture: NO GROWTH

## 2015-10-07 MED ORDER — HYDROMORPHONE HCL 1 MG/ML IJ SOLN: 0.5000 mg | INTRAMUSCULAR | Status: DC | PRN

## 2015-10-07 MED ORDER — HYDROMORPHONE HCL PF 10 MG/ML IJ SOLN
0.5000 mg/h | INTRAMUSCULAR | Status: DC
  Administered 2015-10-07: 0.5 mg/h via INTRAVENOUS
  Filled 2015-10-07: qty 5

## 2015-10-07 MED ORDER — LORAZEPAM 2 MG/ML IJ SOLN
0.5000 mg | INTRAMUSCULAR | Status: DC | PRN
  Filled 2015-10-07: qty 1

## 2015-10-07 MED ORDER — LORAZEPAM 2 MG/ML IJ SOLN
0.5000 mg | Freq: Two times a day (BID) | INTRAMUSCULAR | Status: DC
  Administered 2015-10-07 – 2015-10-08 (×3): 0.5 mg via INTRAVENOUS
  Filled 2015-10-07 (×3): qty 1

## 2015-10-07 NOTE — Progress Notes (Signed)
PROGRESS NOTE                                                                                                                                                                                                             Patient Demographics:    [name], is a 82 y.o. female, DOB - 12/25Linna [name]/1923, ZOX:[id number]:[id number]  Admit date - 10/27/2015   Admitting Physician Stephanie Leashouglas B McQuaid, MD  Outpatient Primary MD for the patient is Stephanie MannsMarne Tower, MD  LOS - 5  Outpatient Specialists: Stephanie GowdaGregg [name]: cardiology   Chief Complaint  Patient presents with  . Respiratory Distress       Brief Narrative   82 year old female with history of A. fib not on anticoagulation, CAD with history of MI, systolic CHF with EF of 15-20% status post ICD, chronic kidney disease stage III, hypertension and Morbid Obesity (BMI 44) was brought to the ED on 10/14/2015 with progressive shortness of breath and bilateral leg swelling. As per family patient having increased shortness of breath for about 2 months with leg swellings. Her PCP doubled her dose of Lasix and also added metolazone which did not improve her symptoms. Patient also having cough for several days. (Was treated for CAP in March). Family also noticed increased left leg redness on the day prior to admission. On presentation she was found to be in acute hypercapnic respiratory failure secondary to CHF exacerbation and possible pneumonia. She was also found to have thrombocytopenia and acute on chronic kidney disease stage III. Patient admitted to stepdown under ICU team on BiPAP. She was diuresed with Lasix and transferred to hospitalist service on 5/5. However patient still dyspneic and significantly hypotensive.    Subjective:   Palliative Care is following the patient.  Somnolent hypotensive, appears to be comfortable.      Assessment  & Plan :   Principal problem Acute combined hypoxemic and  hypercapnic respiratory failure Likely primarily due to acute systolic CHF exacerbation. Repeat 2-D echo this admission, EF 30-35%. Showed some improvement with IV Lasix and BiPAP on admission however patient still dyspneic requiring up to 6 L via nasal cannula and further diuresis limited due to low blood pressure. -Not actively diuresing due to relative hypotension.Diuretics discontinued Discontinued Plavix and statin.  -Overall prognosis is poor.  Left leg cellulitis Keeping legs elevated.  Discontinued vancomycin.  Discontinue  cefepime   Acute systolic CHF EF appears improved compared to 2008 but patient is actively declining.  Supportive care.  Hypotension Caution with IV pain medications.   Acute on chronic kidney disease stage III Possibly cardiorenal. Unable to diurese patient at this time. Monitor closely.  Elevated troponin Possibly demand ischemia due to underlying CHF. Patient denies chest pain symptoms  CAD Discontinued aspirin, Plavix and statin.  A. fib Not on anticoagulation. Discontinued amiodarone. Hold beta blocker  patient has a biventricular pacemaker  , no ICD   Hypothyroidism Discontinued Synthroid.  thrombocytopenia Possibly due to acute illness. Stopped lovenox and vancomycin.  Goals of care  anticipate hospital death. Given her severe cardiomyopathy with active respiratory failure and hypotension, treatment options are limited. Family in agreement with palliative care, but they no longer want her transferred out because she is actively  dying  Code Status : DO NOT RESUSCITATE  Family Communication  : Multiple family members at bedside  Disposition Plan  : Remain in palliative care unit   Barriers For Discharge :  Actively dying  Consults  :   PC CM  Procedures  : 2-D echo   DVT Prophylaxis  :  SCDs, (Thrombocytopenia)  Lab Results  Component Value Date   PLT 83* 10/03/2015    Antibiotics  :   Vancomycin 5/4- 5/6 Zosyn  5/41 Cefepime 5/4-      Objective:   Filed Vitals:   10/04/15 0128 10/05/15 0522 10/06/15 0630 10/07/15 0646  BP: 122/74 100/60 77/38 76/34   Pulse: 89 67 73 75  Temp: 97.5 F (36.4 C) 97.7 F (36.5 C) 98.7 F (37.1 C) 98.2 F (36.8 C)  TempSrc: Axillary Axillary Axillary Axillary  Resp: Height:      Weight:      SpO2: 94% 86% 88% 71%    Wt Readings from Last 3 Encounters:  10/03/15 107.956 kg (238 lb)  08/26/15 105.688 kg (233 lb)  08/06/15 100.585 kg (221 lb 12 oz)     Intake/Output Summary (Last 24 hours) at 10/07/15 0946 Last data filed at 10/06/15 2248  Gross per 24 hour  Intake     20 ml  Output     50 ml  Net    -30 ml     Physical Exam  Gen: Ill appearing, pale Pulm: Diminished bilateral breath sounds CVS: Irregular GI: mildly distended; decreased bowel sounds, Foley catheter + Musculoskeletal: Erythema over left leg, Pitting edema less tense today CNS: Awake, interactive, complaining of knee pain    Data Review:    CBC  Recent Labs Lab 10/04/2015 0620 2015-10-04 0656 10/04/2015 1549 10/03/15 0330  WBC 8.8  --  5.6 6.5  HGB 15.5* 18.7* 13.5 12.2  HCT 53.0* 55.0* 45.3 42.6  PLT 139*  --  85* 83*  MCV 96.9  --  98.5 98.8  MCH 28.3  --  29.3 28.3  MCHC 29.2*  --  29.8* 28.6*  RDW 15.3  --  15.5 15.8*  LYMPHSABS 1.3  --   --   --   MONOABS 1.1*  --   --   --   EOSABS 0.0  --   --   --   BASOSABS 0.0  --   --   --     Chemistries   Recent Labs Lab 10-04-15 0620 2015/10/04 0656 Oct 04, 2015 1549 10/03/15 0330  NA 136 133*  --  138  K 4.9 5.1  --  4.4  CL 93* 94*  --  96*  CO2 28  --   --  28  GLUCOSE 143* 135*  --  101*  BUN 44* 59*  --  49*  CREATININE 2.80* 2.40* 2.86* 2.85*  CALCIUM 8.9  --   --  8.3*  AST 26  --   --   --   ALT 17  --   --   --   ALKPHOS 50  --   --   --   BILITOT 2.8*  --   --   --    Cardiac Enzymes  Recent Labs Lab 26-Oct-2015 0620  TROPONINI 0.20*    ------------------------------------------------------------------------------------------------------------------    Component Value Date/Time   BNP 1179.0* 10-26-15 0620    Inpatient Medications  Scheduled Meds: . antiseptic oral rinse  7 mL Mouth Rinse BID  . latanoprost  1 drop Left Eye QHS  . sodium chloride  250 mL Intravenous Once   Continuous Infusions:   PRN Meds:.sodium chloride, glycopyrrolate, HYDROmorphone (DILAUDID) injection, LORazepam, ondansetron (ZOFRAN) IV  Micro Results Recent Results (from the past 240 hour(s))  Blood Culture (routine x 2)     Status: None (Preliminary result)   Collection Time: 26-Oct-2015  6:20 AM  Result Value Ref Range Status   Specimen Description BLOOD LEFT ARM  Final   Special Requests IN PEDIATRIC BOTTLE  Final   Culture NO GROWTH 4 DAYS  Final   Report Status PENDING  Incomplete  Blood Culture (routine x 2)     Status: None (Preliminary result)   Collection Time: 10/26/15  6:45 AM  Result Value Ref Range Status   Specimen Description BLOOD RIGHT ARM  Final   Special Requests IN PEDIATRIC BOTTLE  Final   Culture NO GROWTH 4 DAYS  Final   Report Status PENDING  Incomplete  Urine culture     Status: Abnormal   Collection Time: 10-26-2015  9:13 AM  Result Value Ref Range Status   Specimen Description URINE, CATHETERIZED  Final   Special Requests NONE  Final   Culture >=100,000 COLONIES/mL KLEBSIELLA PNEUMONIAE (A)  Final   Report Status 10/04/2015 FINAL  Final   Organism ID, Bacteria KLEBSIELLA PNEUMONIAE (A)  Final      Susceptibility   Klebsiella pneumoniae - MIC*    AMPICILLIN >=32 RESISTANT Resistant     CEFAZOLIN <=4 SENSITIVE Sensitive     CEFTRIAXONE <=1 SENSITIVE Sensitive     CIPROFLOXACIN <=0.25 SENSITIVE Sensitive     GENTAMICIN <=1 SENSITIVE Sensitive     IMIPENEM <=0.25 SENSITIVE Sensitive     NITROFURANTOIN 32 SENSITIVE Sensitive     TRIMETH/SULFA <=20 SENSITIVE Sensitive     AMPICILLIN/SULBACTAM 4  SENSITIVE Sensitive     PIP/TAZO <=4 SENSITIVE Sensitive     * >=100,000 COLONIES/mL KLEBSIELLA PNEUMONIAE  MRSA PCR Screening     Status: None   Collection Time: 10/03/15  4:34 AM  Result Value Ref Range Status   MRSA by PCR NEGATIVE NEGATIVE Final    Comment:        The GeneXpert MRSA Assay (FDA approved for NASAL specimens only), is one component of a comprehensive MRSA colonization surveillance program. It is not intended to diagnose MRSA infection nor to guide or monitor treatment for MRSA infections.     Radiology Reports Dg Chest Port 1 View  10/03/2015  CLINICAL DATA:  Respiratory failure. EXAM: PORTABLE CHEST 1 VIEW COMPARISON:  10-26-2015. FINDINGS: AICD in stable position. Prior CABG. Cardiomegaly with mild pulmonary vascular prominence. Bilateral interstitial prominence and  bilateral pleural effusions. Findings consistent congestive heart failure. Low lung volumes with bibasilar atelectasis. Bibasilar pneumonia cannot be excluded. Similar findings noted on prior study. No pneumothorax. IMPRESSION: 1. AICD in stable position.  Prior CABG. 2. Cardiomegaly with mild pulmonary vascular prominence. Bilateral interstitial prominence and bilateral pleural effusions. Findings consistent congestive heart failure. No change from prior exam. 3. Low lung volumes with bibasilar atelectasis. Bibasilar pneumonia cannot be excluded. Similar findings noted on prior exam . Electronically Signed   By: Maisie Fus  Register   On: 10/03/2015 07:17   Dg Chest Portable 1 View  2015-10-04  CLINICAL DATA:  Shortness of breath for a week, worse today. EXAM: PORTABLE CHEST 1 VIEW COMPARISON:  08/06/2015 FINDINGS: Postoperative changes in the mediastinum. Cardiac pacemaker. Cardiac enlargement. No vascular congestion. Bilateral basilar infiltration or atelectasis with small bilateral pleural effusions. Changes are progressing since previous study. No pneumothorax. Calcified and tortuous aorta. IMPRESSION:  Progressing changes of infiltration or atelectasis in the lung bases and small bilateral pleural effusions. Electronically Signed   By: Burman Nieves M.D.   On: Oct 04, 2015 06:46    Time Spent in minutes  20   Rokhaya Quinn M.D on 10/07/2015 at 9:46 AM Pager [phone]

## 2015-10-07 NOTE — Progress Notes (Signed)
Patient is not stable for transport. BP 77/38 sats 71% and symptoms not controlled. We have started her on continuous infusion and scheduled sedation. Expect death within 24 hours. Hospital death expected.  Stephanie MaltaElizabeth Mikayla Chiusano, DO Palliative Medicine [phone]

## 2015-10-07 NOTE — Progress Notes (Signed)
Palliative Medicine RN Note: Pt w tense face and labored breathing. Family at bedside and very concerned about comfort level. Spoke with RN Aurea GraffJoan; she will get pt prn dose of dilaudid. Family clarified that goal above all else is comfort and that they understand the end is very near. Spoke with Dr Julaine FusiBeth Golding about the pt; new orders obtained. Plan for f/u by member of PMT this afternoon.  Donn PieriniMelanie G. Oliver, RN, BSN, [facility] 10/07/2015 9:49 AM Cell [phone] 8:00-4:00 Monday-Friday Office [phone]

## 2015-10-07 NOTE — Progress Notes (Signed)
Pt. Family member refuse the bath for Pt. She stated to let her rest since she had had a bath yesterday morning. I  Still perform foley care and peri care.

## 2015-10-08 DIAGNOSIS — J9601 Acute respiratory failure with hypoxia: Secondary | ICD-10-CM

## 2015-10-30 NOTE — Progress Notes (Signed)
PMT RN: Shari HeritageSon, daughter in law, grand daughter and her husband at bedside. Pt is now comfortable. Stayed with family for about 30 minutes discussed dying process and doing life review with family. Plan f/u by PMT BID, as she is so near death and therefore at high risk for rapid symptom escalation.  Donn PieriniMelanie G. Oliver, RN, BSN, [facility] 10/02/2015 9:06 AM Cell [phone] 8:00-4:00 Monday-Friday Office [phone]

## 2015-10-30 NOTE — Progress Notes (Signed)
Patient was surrounded by family members, I was called that patient is not breathing. Patient is a DNR, no pulse, no respirations, pooling of blood in feet and hands, physicia was notified, verified death with 2 nurses, , expired today at 17:02, [employer] notified and patient not a donor candidate.

## 2015-10-30 NOTE — Progress Notes (Signed)
Palliative Medicine RN Note: Pt on continuous dilaudid infusion. Respirations are very shallow with rate of about 6-8 per minute. PMT will f/u this afternoon to ensure symptoms remain controlled.  Donn PieriniMelanie G. Oliver, RN, BSN, [facility] 13-Dec-2015 9:04 AM Cell [phone] 8:00-4:00 Monday-Friday Office [phone]

## 2015-10-30 NOTE — Discharge Summary (Addendum)
Physician Discharge Summary  [name] MRN: [id number] DOB/AGE: [birth year] 80 y.o.  PCP: Roxy Manns, MD   Admit date: 10-27-2015 Death date: 11/02/15  Discharge Diagnoses:     Principal Problem:   Acute on chronic combined systolic and diastolic heart failure (HCC) Active Problems:   Atrial fibrillation (HCC)   CHF exacerbation (HCC)   Acute renal failure superimposed on stage 3 chronic kidney disease (HCC)   Cardiorenal syndrome with renal failure   Thrombocytopenia (HCC)   Cellulitis of leg, left   Hypotension   Encounter for palliative care   Goals of care, counseling/discussion   Palliative care encounter   Acute respiratory failure with hypoxemia [facility])              Discharge Condition: *Deceased    No Known Allergies        Consults: Palliative care     Significant Diagnostic Studies:  Dg Chest Port 1 View  10/03/2015  CLINICAL DATA:  Respiratory failure. EXAM: PORTABLE CHEST 1 VIEW COMPARISON:  Oct 27, 2015. FINDINGS: AICD in stable position. Prior CABG. Cardiomegaly with mild pulmonary vascular prominence. Bilateral interstitial prominence and bilateral pleural effusions. Findings consistent congestive heart failure. Low lung volumes with bibasilar atelectasis. Bibasilar pneumonia cannot be excluded. Similar findings noted on prior study. No pneumothorax. IMPRESSION: 1. AICD in stable position.  Prior CABG. 2. Cardiomegaly with mild pulmonary vascular prominence. Bilateral interstitial prominence and bilateral pleural effusions. Findings consistent congestive heart failure. No change from prior exam. 3. Low lung volumes with bibasilar atelectasis. Bibasilar pneumonia cannot be excluded. Similar findings noted on prior exam . Electronically Signed   By: Maisie Fus  Register   On: 10/03/2015 07:17   Dg Chest Portable 1 View  Oct 27, 2015  CLINICAL DATA:  Shortness of breath for a week, worse today. EXAM: PORTABLE CHEST 1 VIEW COMPARISON:  08/06/2015  FINDINGS: Postoperative changes in the mediastinum. Cardiac pacemaker. Cardiac enlargement. No vascular congestion. Bilateral basilar infiltration or atelectasis with small bilateral pleural effusions. Changes are progressing since previous study. No pneumothorax. Calcified and tortuous aorta. IMPRESSION: Progressing changes of infiltration or atelectasis in the lung bases and small bilateral pleural effusions. Electronically Signed   By: Burman Nieves M.D.   On: 27-Oct-2015 06:46        Filed Weights   2015/10/27 0623 27-Oct-2015 1231 10/03/15 0353  Weight: 113.399 kg (250 lb) 111.4 kg (245 lb 9.5 oz) 107.956 kg (238 lb)     Microbiology: Recent Results (from the past 240 hour(s))  Blood Culture (routine x 2)     Status: None   Collection Time: 10/27/2015  6:20 AM  Result Value Ref Range Status   Specimen Description BLOOD LEFT ARM  Final   Special Requests IN PEDIATRIC BOTTLE  Final   Culture NO GROWTH 5 DAYS  Final   Report Status 10/07/2015 FINAL  Final  Blood Culture (routine x 2)     Status: None   Collection Time: 10-27-2015  6:45 AM  Result Value Ref Range Status   Specimen Description BLOOD RIGHT ARM  Final   Special Requests IN PEDIATRIC BOTTLE  Final   Culture NO GROWTH 5 DAYS  Final   Report Status 10/07/2015 FINAL  Final  Urine culture     Status: Abnormal   Collection Time: 10/27/15  9:13 AM  Result Value Ref Range Status   Specimen Description URINE, CATHETERIZED  Final   Special Requests NONE  Final   Culture >=100,000 COLONIES/mL KLEBSIELLA PNEUMONIAE (A)  Final  Report Status 10/04/2015 FINAL  Final   Organism ID, Bacteria KLEBSIELLA PNEUMONIAE (A)  Final      Susceptibility   Klebsiella pneumoniae - MIC*    AMPICILLIN >=32 RESISTANT Resistant     CEFAZOLIN <=4 SENSITIVE Sensitive     CEFTRIAXONE <=1 SENSITIVE Sensitive     CIPROFLOXACIN <=0.25 SENSITIVE Sensitive     GENTAMICIN <=1 SENSITIVE Sensitive     IMIPENEM <=0.25 SENSITIVE Sensitive      NITROFURANTOIN 32 SENSITIVE Sensitive     TRIMETH/SULFA <=20 SENSITIVE Sensitive     AMPICILLIN/SULBACTAM 4 SENSITIVE Sensitive     PIP/TAZO <=4 SENSITIVE Sensitive     * >=100,000 COLONIES/mL KLEBSIELLA PNEUMONIAE  MRSA PCR Screening     Status: None   Collection Time: 10/03/15  4:34 AM  Result Value Ref Range Status   MRSA by PCR NEGATIVE NEGATIVE Final    Comment:        The GeneXpert MRSA Assay (FDA approved for NASAL specimens only), is one component of a comprehensive MRSA colonization surveillance program. It is not intended to diagnose MRSA infection nor to guide or monitor treatment for MRSA infections.        Blood Culture    Component Value Date/Time   SDES URINE, CATHETERIZED Jan 12, 2016 0913   SPECREQUEST NONE Jan 12, 2016 0913   CULT >=100,000 COLONIES/mL KLEBSIELLA PNEUMONIAE* Jan 12, 2016 0913   REPTSTATUS 10/04/2015 FINAL Jan 12, 2016 0913      Labs: No results found for this or any previous visit (from the past 48 hour(s)).   Lipid Panel     Component Value Date/Time   CHOL  02/09/2008 0402    136        ATP III CLASSIFICATION:  <200     mg/dL   Desirable  [id number]  mg/dL   Borderline High  >=578>=240    mg/dL   High   TRIG 97 46/96/295209/03/2008 0402   HDL 44 02/09/2008 0402   CHOLHDL 3.1 02/09/2008 0402   VLDL 19 02/09/2008 0402   LDLCALC  02/09/2008 0402    73        Total Cholesterol/HDL:CHD Risk Coronary Heart Disease Risk Table                     Men   Women  1/2 Average Risk   3.4   3.3     No results found for: HGBA1C   Lab Results  Component Value Date   LDLCALC  02/09/2008    73        Total Cholesterol/HDL:CHD Risk Coronary Heart Disease Risk Table                     Men   Women  1/2 Average Risk   3.4   3.3   CREATININE 2.85* 10/03/2015    82 year old female with history of A. fib not on anticoagulation, CAD with history of MI, systolic CHF with EF of 15-20% status post ICD, chronic kidney disease stage III, hypertension and Morbid  Obesity (BMI 44) was brought to the ED on 10/12/2015 with progressive shortness of breath and bilateral leg swelling. As per family patient having increased shortness of breath for about 2 months with leg swellings. Her PCP doubled her dose of Lasix and also added metolazone which did not improve her symptoms. Patient also having cough for several days. (Was treated for CAP in March). Family also noticed increased left leg redness on the day prior to admission. On presentation she was  found to be in acute hypercapnic respiratory failure secondary to CHF exacerbation and possible pneumonia. She was also found to have thrombocytopenia and acute on chronic kidney disease stage III. Patient admitted to stepdown under ICU team on BiPAP. She was diuresed with Lasix and transferred to hospitalist service on 5/5. However patient still dyspneic and significantly hypotensive.     Hospital course      Acute combined hypoxemic and hypercapnic respiratory failure Likely primarily due to acute systolic CHF exacerbation. Repeat 2-D echo this admission, EF 30-35%. Showed some improvement with IV Lasix and BiPAP on admission however patient still dyspneic requiring up to 6 L via nasal cannula and further diuresis limited due to low blood pressure. -Not actively diuresing due to relative hypotension.Diuretics discontinued As the patient was transitioned to comfort care Discontinued Plavix and statin.  -Overall prognosis is poor. We anticipate hospital death  Left leg cellulitis Antibiotics namely vancomycin and cefepime started but subsequently discontinued   Acute systolic CHF EF appears improved compared to 2008 but patient is actively declining. Supportive care.  Hypotension Patient treated with  narcotics for comfort during her stay  Acute on chronic kidney disease stage III Possibly cardiorenal. Unable to diurese due to low blood pressure  Elevated troponin Possibly demand ischemia due to  underlying CHF.    CAD Discontinued aspirin and Plavix and statin  A. fib Not on anticoagulation. Discontinued amiodarone/beta blocker patient has a biventricular pacemaker , no ICD   Hypothyroidism Discontinued Synthroid.  thrombocytopenia Lovenox was discontinued, and thrombocytopenia secondary to critical illness  Goals of care anticipate hospital death. Given her severe cardiomyopathy with active respiratory failure and hypotension, treatment options are limited. Family in agreement with palliative care, but they no longer want her transferred out because she is actively dying        Discharge Exam:   Blood pressure 84/36, pulse 77, temperature 98.8 F (37.1 C), temperature source Axillary, resp. rate 15, height 5\' 3"  (1.6 m), weight 107.956 kg (238 lb), SpO2 92 %.  Gen: Ill appearing, pale Pulm: Diminished bilateral breath sounds CVS: Irregular GI: mildly distended; decreased bowel sounds, Foley catheter + Musculoskeletal: Erythema over left leg, Pitting edema less tense today CNS: Awake, interactive, complaining of knee pain    Signed: Trashawn Oquendo 09/29/2015, 11:49 AM        Time spent >45 mins

## 2015-10-30 DEATH — deceased

## 2017-11-01 IMAGING — DX DG CHEST 2V
2 series · 2 of 2 positions shown · non-contrast
Comparison: Chest x-ray of 04/23/2011

CLINICAL DATA: Productive cough, hemoptysis

EXAM:
CHEST  2 VIEW

[chest pa]
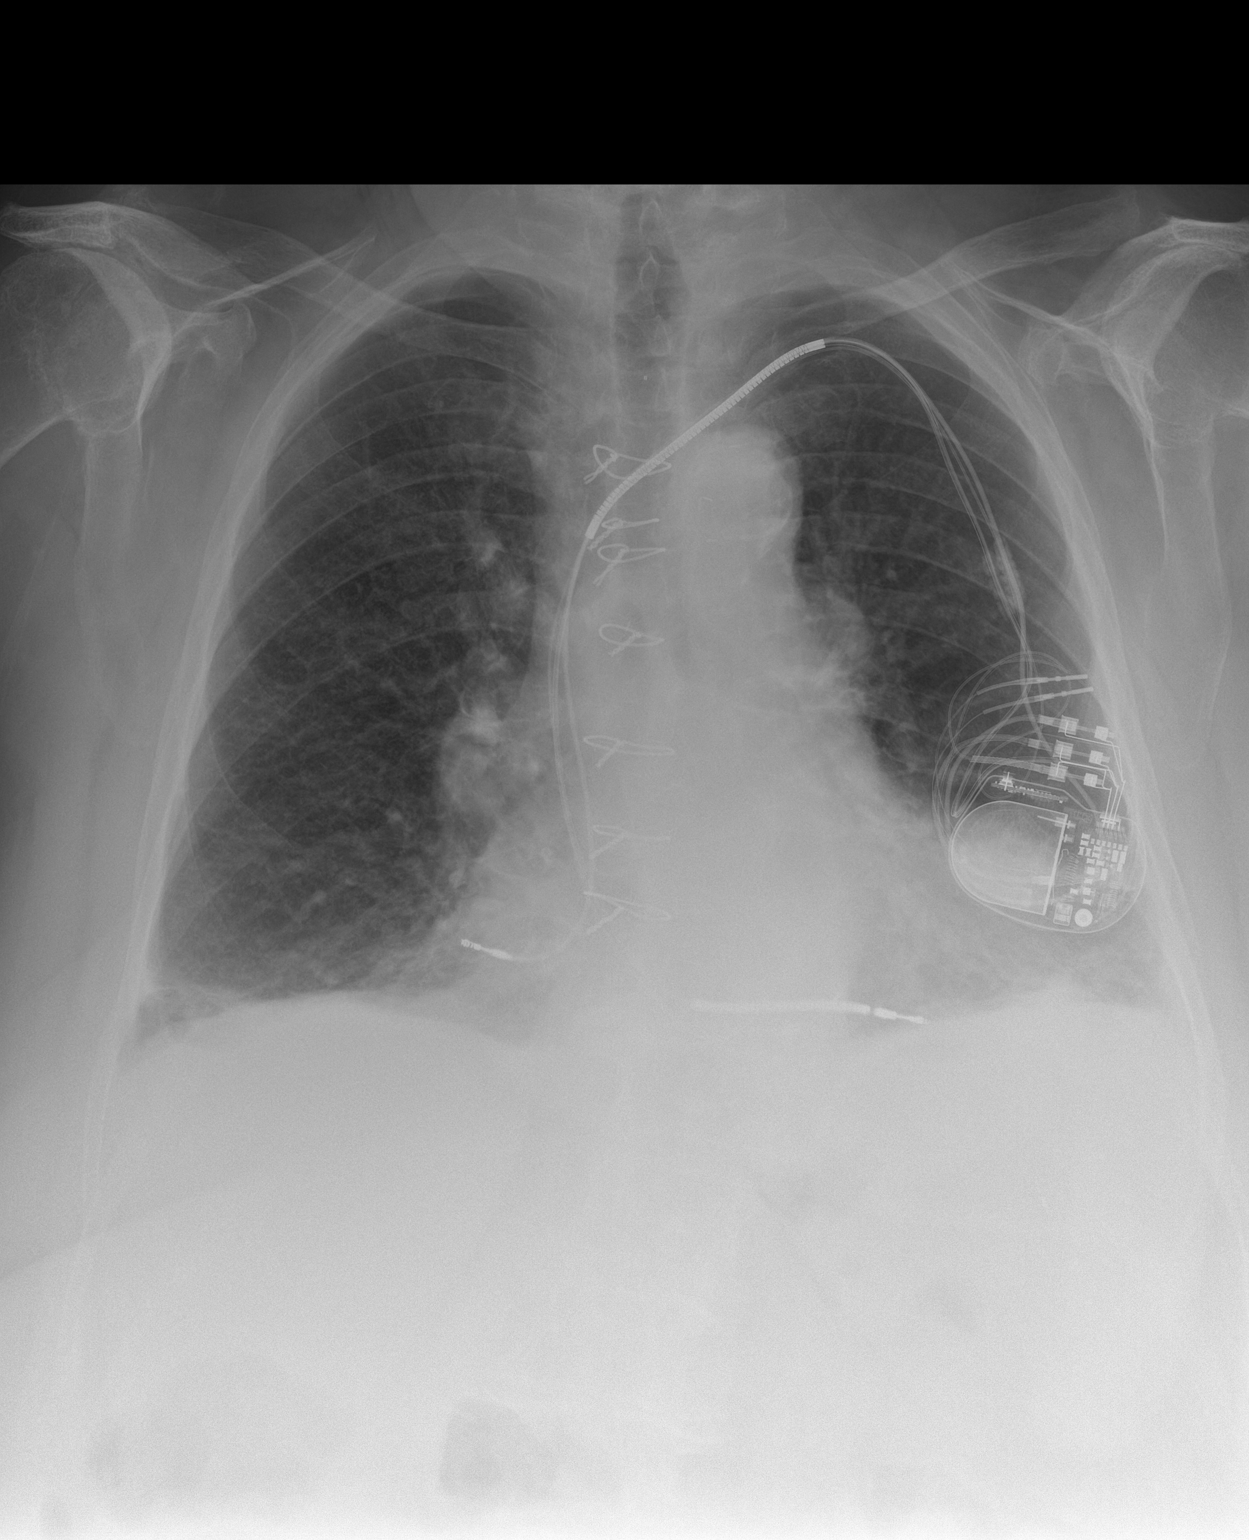

[chest lat]
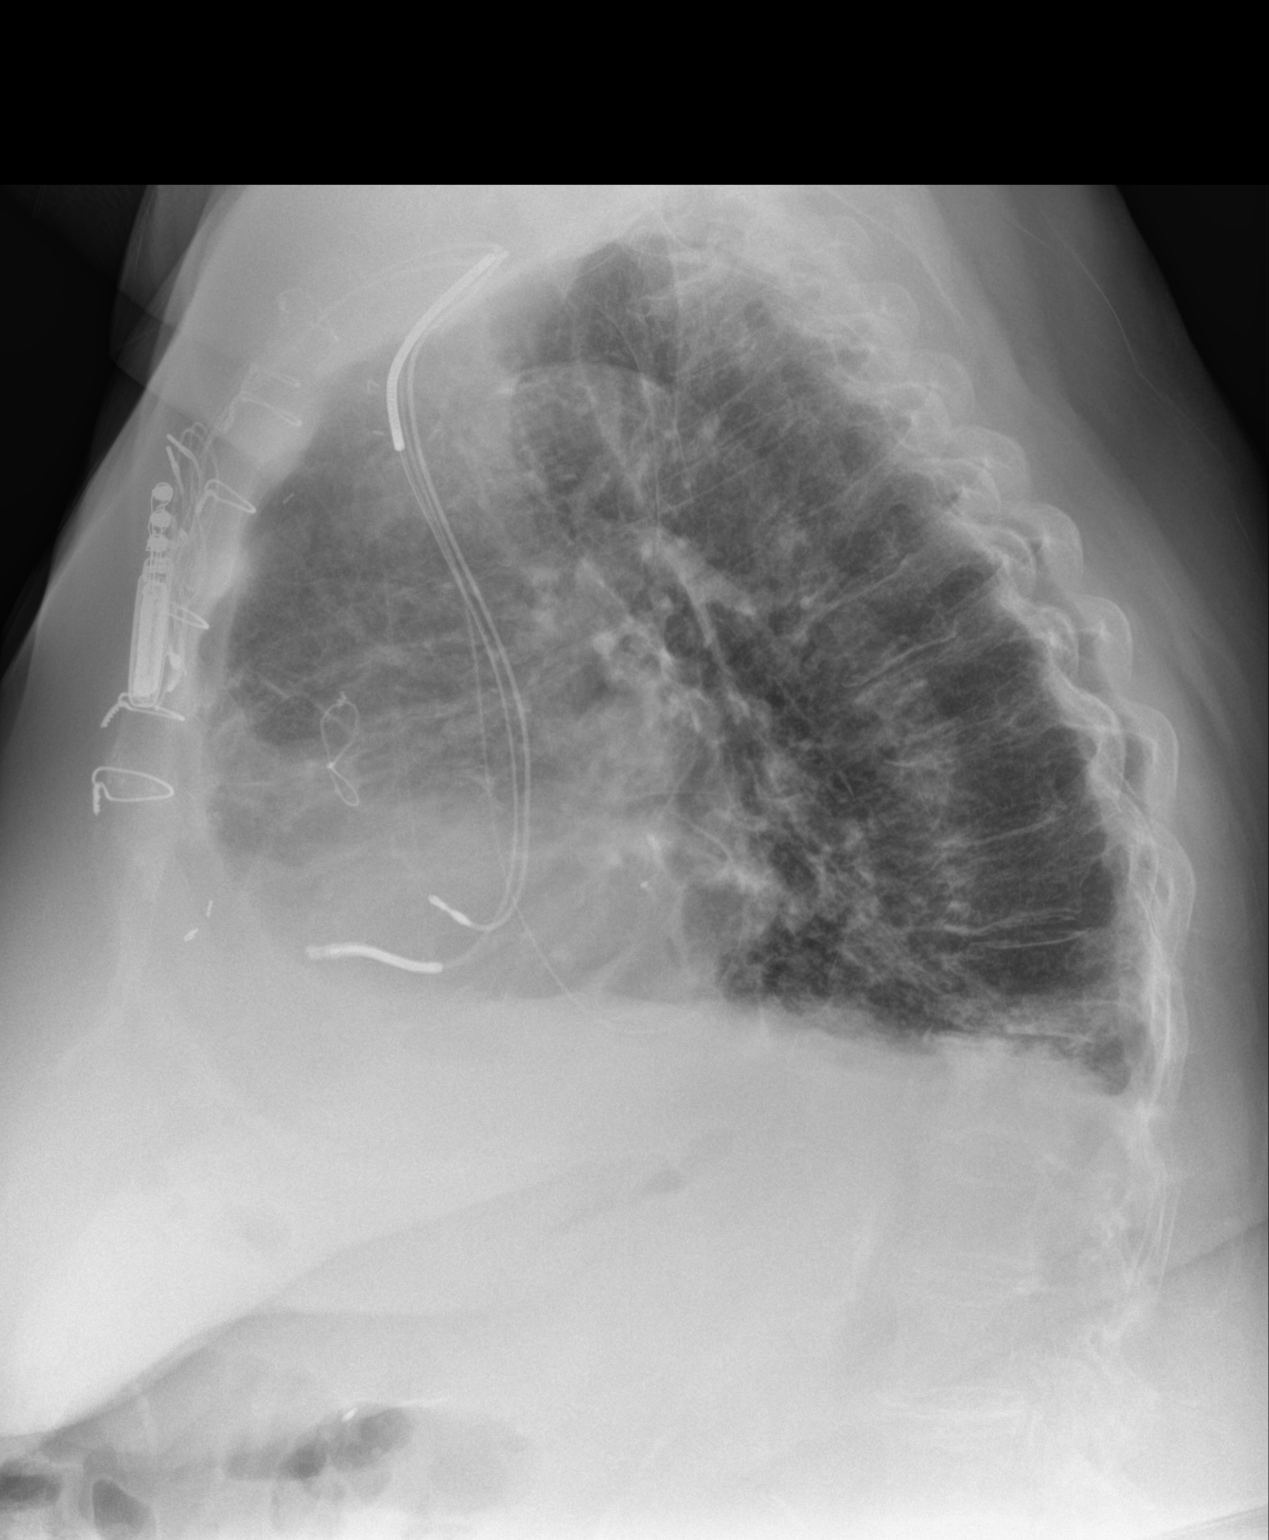

[2 of 2 positions shown; findings below may reference images not displayed]

FINDINGS: Prominent linear markings at the lung bases may represent
atelectasis, but development of pneumonia particularly in the left
lower lobe cannot be excluded. Followup films are recommended.
Moderate cardiomegaly is stable and AICD lead remains. The bones are
osteopenic.
IMPRESSION: Bibasilar linear atelectasis. Difficult to exclude developing
pneumonia particularly in the left lower lobe. Consider followup
chest x-ray if symptoms warrant.
